# Patient Record
Sex: Male | Born: 1950 | Race: White | Hispanic: No | Smoking: Current every day smoker
Health system: Southern US, Community
[De-identification: ages and names within clinical notes are randomized; demographics above are authoritative.]

## PROBLEM LIST (undated history)

## (undated) DIAGNOSIS — M1990 Unspecified osteoarthritis, unspecified site: Secondary | ICD-10-CM

## (undated) DIAGNOSIS — I10 Essential (primary) hypertension: Secondary | ICD-10-CM

## (undated) DIAGNOSIS — K219 Gastro-esophageal reflux disease without esophagitis: Secondary | ICD-10-CM

## (undated) DIAGNOSIS — Z720 Tobacco use: Secondary | ICD-10-CM

## (undated) DIAGNOSIS — E049 Nontoxic goiter, unspecified: Secondary | ICD-10-CM

## (undated) DIAGNOSIS — E785 Hyperlipidemia, unspecified: Secondary | ICD-10-CM

## (undated) DIAGNOSIS — I2510 Atherosclerotic heart disease of native coronary artery without angina pectoris: Secondary | ICD-10-CM

## (undated) DIAGNOSIS — I350 Nonrheumatic aortic (valve) stenosis: Secondary | ICD-10-CM

## (undated) HISTORY — DX: Tobacco use: Z72.0

## (undated) HISTORY — DX: Hyperlipidemia, unspecified: E78.5

## (undated) HISTORY — DX: Unspecified osteoarthritis, unspecified site: M19.90

## (undated) HISTORY — PX: BACK SURGERY: SHX140

## (undated) HISTORY — DX: Gastro-esophageal reflux disease without esophagitis: K21.9

## (undated) HISTORY — DX: Atherosclerotic heart disease of native coronary artery without angina pectoris: I25.10

## (undated) HISTORY — DX: Essential (primary) hypertension: I10

---

## 2006-09-04 HISTORY — PX: CORONARY ARTERY BYPASS GRAFT: SHX141

## 2010-08-13 ENCOUNTER — Emergency Department (HOSPITAL_COMMUNITY)
Admission: EM | Admit: 2010-08-13 | Discharge: 2010-08-13 | Payer: Self-pay | Source: Home / Self Care | Admitting: Emergency Medicine

## 2011-01-11 ENCOUNTER — Emergency Department (HOSPITAL_COMMUNITY)
Admission: EM | Admit: 2011-01-11 | Discharge: 2011-01-11 | Disposition: A | Discharge status: Home / Self Care | Payer: Medicare Other | Attending: Emergency Medicine | Admitting: Emergency Medicine

## 2011-01-11 DIAGNOSIS — M25539 Pain in unspecified wrist: Secondary | ICD-10-CM | POA: Insufficient documentation

## 2011-01-11 DIAGNOSIS — I2510 Atherosclerotic heart disease of native coronary artery without angina pectoris: Secondary | ICD-10-CM | POA: Insufficient documentation

## 2011-01-11 DIAGNOSIS — E785 Hyperlipidemia, unspecified: Secondary | ICD-10-CM | POA: Insufficient documentation

## 2011-01-11 DIAGNOSIS — M25519 Pain in unspecified shoulder: Secondary | ICD-10-CM | POA: Insufficient documentation

## 2011-01-11 DIAGNOSIS — I10 Essential (primary) hypertension: Secondary | ICD-10-CM | POA: Insufficient documentation

## 2012-07-11 DIAGNOSIS — R0602 Shortness of breath: Secondary | ICD-10-CM

## 2012-07-11 DIAGNOSIS — R079 Chest pain, unspecified: Secondary | ICD-10-CM

## 2013-06-24 ENCOUNTER — Encounter: Payer: Self-pay | Admitting: Family Medicine

## 2013-06-24 ENCOUNTER — Ambulatory Visit (INDEPENDENT_AMBULATORY_CARE_PROVIDER_SITE_OTHER): Payer: Medicare Other | Admitting: Family Medicine

## 2013-06-24 VITALS — BP 134/90 | Temp 98.1°F | Wt 220.2 lb

## 2013-06-24 DIAGNOSIS — E785 Hyperlipidemia, unspecified: Secondary | ICD-10-CM

## 2013-06-24 DIAGNOSIS — J449 Chronic obstructive pulmonary disease, unspecified: Secondary | ICD-10-CM

## 2013-06-24 DIAGNOSIS — I25812 Atherosclerosis of bypass graft of coronary artery of transplanted heart without angina pectoris: Secondary | ICD-10-CM

## 2013-06-24 DIAGNOSIS — J4489 Other specified chronic obstructive pulmonary disease: Secondary | ICD-10-CM

## 2013-06-24 DIAGNOSIS — M19019 Primary osteoarthritis, unspecified shoulder: Secondary | ICD-10-CM

## 2013-06-24 DIAGNOSIS — F17200 Nicotine dependence, unspecified, uncomplicated: Secondary | ICD-10-CM

## 2013-06-24 DIAGNOSIS — I10 Essential (primary) hypertension: Secondary | ICD-10-CM

## 2013-06-24 LAB — CBC WITH DIFFERENTIAL/PLATELET
Eosinophils Absolute: 0.1 10*3/uL (ref 0.0–0.7)
Lymphocytes Relative: 30 % (ref 12–46)
MCH: 32.5 pg (ref 26.0–34.0)
Monocytes Absolute: 0.6 10*3/uL (ref 0.1–1.0)
Monocytes Relative: 10 % (ref 3–12)
Neutro Abs: 3.7 10*3/uL (ref 1.7–7.7)
Neutrophils Relative %: 59 % (ref 43–77)
RBC: 5.2 MIL/uL (ref 4.22–5.81)
RDW: 14.3 % (ref 11.5–15.5)

## 2013-06-24 NOTE — Patient Instructions (Signed)
Hypertension As your heart beats, it forces blood through your arteries. This force is your blood pressure. If the pressure is too high, it is called hypertension (HTN) or high blood pressure. HTN is dangerous because you may have it and not know it. High blood pressure may mean that your heart has to work harder to pump blood. Your arteries may be narrow or stiff. The extra work puts you at risk for heart disease, stroke, and other problems.  Blood pressure consists of two numbers, a higher number over a lower, 110/72, for example. It is stated as "110 over 72." The ideal is below 120 for the top number (systolic) and under 80 for the bottom (diastolic). Write down your blood pressure today. You should pay close attention to your blood pressure if you have certain conditions such as:  Heart failure.  Prior heart attack.  Diabetes  Chronic kidney disease.  Prior stroke.  Multiple risk factors for heart disease. To see if you have HTN, your blood pressure should be measured while you are seated with your arm held at the level of the heart. It should be measured at least twice. A one-time elevated blood pressure reading (especially in the Emergency Department) does not mean that you need treatment. There may be conditions in which the blood pressure is different between your right and left arms. It is important to see your caregiver soon for a recheck. Most people have essential hypertension which means that there is not a specific cause. This type of high blood pressure may be lowered by changing lifestyle factors such as:  Stress.  Smoking.  Lack of exercise.  Excessive weight.  Drug/tobacco/alcohol use.  Eating less salt. Most people do not have symptoms from high blood pressure until it has caused damage to the body. Effective treatment can often prevent, delay or reduce that damage. TREATMENT  When a cause has been identified, treatment for high blood pressure is directed at the  cause. There are a large number of medications to treat HTN. These fall into several categories, and your caregiver will help you select the medicines that are best for you. Medications may have side effects. You should review side effects with your caregiver. If your blood pressure stays high after you have made lifestyle changes or started on medicines,   Your medication(s) may need to be changed.  Other problems may need to be addressed.  Be certain you understand your prescriptions, and know how and when to take your medicine.  Be sure to follow up with your caregiver within the time frame advised (usually within two weeks) to have your blood pressure rechecked and to review your medications.  If you are taking more than one medicine to lower your blood pressure, make sure you know how and at what times they should be taken. Taking two medicines at the same time can result in blood pressure that is too low. SEEK IMMEDIATE MEDICAL CARE IF:  You develop a severe headache, blurred or changing vision, or confusion.  You have unusual weakness or numbness, or a faint feeling.  You have severe chest or abdominal pain, vomiting, or breathing problems. MAKE SURE YOU:   Understand these instructions.  Will watch your condition.  Will get help right away if you are not doing well or get worse. Document Released: 08/21/2005 Document Revised: 11/13/2011 Document Reviewed: 04/10/2008 Exit[employer] Patient Information 2014 [employer]. Chronic Obstructive Pulmonary Disease Chronic obstructive pulmonary disease (COPD) is a condition in which airflow from   the lungs is restricted. The lungs can never return to normal, but there are measures you can take which will improve them and make you feel better. CAUSES   Smoking.  Exposure to secondhand smoke.  Breathing in irritants such as air pollution, dust, cigarette smoke, strong odors, aerosol sprays, or paint fumes.  History of lung  infections. SYMPTOMS   Deep, persistent (chronic) cough with a large amount of thick mucus.  Wheezing.  Shortness of breath, especially with physical activity.  Feeling like you cannot get enough air.  Difficulty breathing.  Rapid breaths (tachypnea).  Gray or bluish discoloration (cyanosis) of the skin, especially in fingers, toes, or lips.  Fatigue.  Weight loss.  Swelling in legs, ankles, or feet.  Fast heartbeat (tachycardia).  Frequent lung infections.   Chest tightness. DIAGNOSIS  Initial diagnosis may be based on your history, symptoms, and physical examination. Additional tests for COPD may include:  Chest X-ray.  Computed tomography (CT) scan.  Lung (pulmonary) function tests.  Blood tests. TREATMENT  Treatment focuses on making you comfortable (supportive care). Your caregiver may prescribe medicines (inhaled or pills) to help improve your breathing. Additional treatment options may include oxygen therapy and pulmonary rehabilitation. Treatment should also include reducing your exposure to known irritants and following a plan to stop smoking. HOME CARE INSTRUCTIONS   Take all medicines, including antibiotic medicines, as directed by your caregiver.  Use inhaled medicines as directed by your caregiver.  Avoid medicines or cough syrups that dry up your airway (antihistamines) and slow down the elimination of secretions. This decreases respiratory capacity and may lead to infections.  If you smoke, stop smoking.  Avoid exposure to smoke, chemicals, and fumes that aggravate your breathing.  Avoid contact with individuals that have a contagious illness.  Avoid extreme temperature and humidity changes.  Use humidifiers at home and at your bedside if they do not make breathing difficult.  Drink enough water and fluids to keep your urine clear or pale yellow. This loosens secretions.  Eat healthy foods. Eating smaller, more frequent meals and resting  before meals may help you maintain your strength.  Ask your caregiver about the use of vitamins and mineral supplements.  Stay active. Exercise and physical activity will help maintain your ability to do things you want to do.  Balance activity with periods of rest.  Assume a position of comfort if you become short of breath.  Learn and use relaxation techniques.  Learn and use controlled breathing techniques as directed by your caregiver. Controlled breathing techniques include:  Pursed lip breathing. This breathing technique starts with breathing in (inhaling) through your nose for 1 second. Next, purse your lips as if you were going to whistle. Then breathe out (exhale) through the pursed lips for 2 seconds.  Diaphragmatic breathing. Start by putting one hand on your abdomen just above your waist. Inhale slowly through your nose. The hand on your abdomen should move out. Then exhale slowly through pursed lips. You should be able to feel the hand on your abdomen moving in as you exhale.  Learn and use controlled coughing to clear mucus from your lungs. Controlled coughing is a series of short, progressive coughs. The steps of controlled coughing are: 1. Lean your head slightly forward. 2. Breathe in deeply using diaphragmatic breathing. 3. Try to hold your breath for 3 seconds. 4. Keep your mouth slightly open while coughing twice. 5. Spit any mucus out into a tissue. 6. Rest and repeat the steps once   or twice as needed.  Receive all protective vaccines your caregiver suggests, especially pneumococcal and influenza vaccines.  Learn to manage stress.  Schedule and attend all follow-up appointments as directed by your caregiver. It is important to keep all your appointments.  Participate in pulmonary rehabilitation as directed by your caregiver.  Use home oxygen as suggested. SEEK MEDICAL CARE IF:   You are coughing up more mucus than usual.  There is a change in the color or  thickness of the mucus.  Breathing is more labored than usual.  Your breathing is faster than usual.  Your skin color is more cyanotic than usual.  You are running out of the medicine you take for your breathing.  You are anxious, apprehensive, or restless.  You have a fever. SEEK IMMEDIATE MEDICAL CARE IF:   You have a rapid heart rate.  You have shortness of breath while you are resting.  You have shortness of breath that prevents you from being able to talk.  You have shortness of breath that prevents you from performing your usual physical activities.  You have chest pain lasting longer than 5 minutes.  You have a seizure.  Your family or friends notice that you are agitated or confused. MAKE SURE YOU:   Understand these instructions.  Will watch your condition.  Will get help right away if you are not doing well or get worse. Document Released: 05/31/2005 Document Revised: 05/15/2012 Document Reviewed: 10/21/2010 Exit[employer] Patient Information 2014 [employer].  

## 2013-06-24 NOTE — Progress Notes (Signed)
Subjective:    Patient ID: Craig Harmon, male    DOB: [birth year], 62 y.o.   MRN: [id number]  HPI Comments: Craig Harmon is a 62 y.o WM here for establishment of care.  Craig Harmon begins  By saying that he use to see Dr. Rolly Pancake in [facility] but doesn't have a truck now.  He is currently in a court battle because he traded his truck for his car but he didn't have the car title and he is trying to get his truck now and is unable to call him.  He has PMH of CAD, MI with 3v CABG, HLD, HTN, tobacco, GERD, OA of right shoulder and tobacco use with COPD. He says he has occasional midsternal chest pain with exertion. He also has pain when lying in bed and changing positions. He says there are needles and pin sensations in his chest. He says lying flat on his back. He is taking Neurontin at nighttime for this pain and he says it helped. He was given this by Dr. Gerlene Burdock cardiologist in [place].   He says he needs refills on his medications.   He denies any complaints today and says he has been out of his medications for about a month. He does have HTN  But blood pressure is good today. It is also noted that the last date on his medication bottles are June and he says he extends his medicines out due to cost.  PMH: HTN, CAD, HLD, Depression, GERD, OA of right shoulder and he gets injections there for lubrication, COPD  Medications: as listed Allergies: NKDA  Social: lives in [place], [place] with younger sister. Great nephew also lives there. Smokes 1 pp day and a half. He doesn't use alcohol or drug use. Has 3 grown children in [place] and 2 in [place], [place]. Surgeries: CABG 3V 2008 [facility] [place] last stress test in [place] and he says it came back good.   Family: mom died 25s from MI, father died 14, he also says his oldest brother and younger sister both had Cystic Fibrosis  Last colonoscopy was 9 months ago and had polyps removed. Unsure what it showed and when he is to come back.      Review of  Systems  Constitutional: Negative for chills, appetite change, fatigue and unexpected weight change.  HENT: Negative for rhinorrhea, sore throat and trouble swallowing.   Eyes: Negative for pain and visual disturbance.  Respiratory: Negative for chest tightness, shortness of breath and wheezing.   Cardiovascular: Positive for chest pain. Negative for palpitations.       Since Cabg but neurontin helps   Gastrointestinal: Negative for nausea, vomiting, diarrhea and constipation.  Endocrine: Negative for cold intolerance, heat intolerance, polydipsia and polyuria.  Genitourinary: Negative for dysuria, frequency, flank pain and difficulty urinating.  Musculoskeletal: Negative for gait problem and myalgias.  Skin: Negative for color change and rash.  Neurological: Negative for dizziness, syncope, weakness, numbness and headaches.  Hematological: Negative for adenopathy. Does not bruise/bleed easily.  Psychiatric/Behavioral: Negative for behavioral problems and confusion.       Objective:   Physical Exam  Nursing note and vitals reviewed. Constitutional: He is oriented to person, place, and time.  WM in NAD  HENT:  Head: Normocephalic and atraumatic.  Right Ear: External ear normal.  Left Ear: External ear normal.  Nose: Nose normal.  Mouth/Throat: Oropharynx is clear and moist.  Eyes: Conjunctivae are normal. Pupils are equal, round, and reactive to light.  Neck: Normal range of motion. Neck supple.  Cardiovascular: Normal rate and normal heart sounds.   Pulmonary/Chest: Effort normal. No respiratory distress. He has no wheezes.  Abdominal: Soft. Bowel sounds are normal. He exhibits no distension. There is no tenderness.  Musculoskeletal: Normal range of motion.  Decreased ROM of right shoulder with abduction to 45 degrees secondary to stiffness and pain  Neurological: He is alert and oriented to person, place, and time.  Skin: Skin is warm and dry.  Psychiatric: He has a normal  mood and affect. His behavior is normal. Thought content normal.      Assessment & Plan:  Craig Harmon was seen today for medication management.  Diagnoses and associated orders for this visit:  HTN (hypertension) - CBC with Differential - Comprehensive metabolic panel - Lipid Panel  COPD (chronic obstructive pulmonary disease) - CBC with Differential  CAD (coronary artery disease), bypass graft transplanted heart - CBC with Differential  HLD (hyperlipidemia) - CBC with Differential - Comprehensive metabolic panel - Lipid Panel  Tobacco use disorder  Osteoarthritis of shoulder, right   will get baseline labs and have patient request records. Will verify which COPD inhaler he is taking and also call this in to be refilled. Follow up in 3 months pending results of labs. If any abnormal results, may have patient follow up sooner.

## 2013-06-25 ENCOUNTER — Encounter: Payer: Self-pay | Admitting: Family Medicine

## 2013-06-25 DIAGNOSIS — M19019 Primary osteoarthritis, unspecified shoulder: Secondary | ICD-10-CM | POA: Insufficient documentation

## 2013-06-25 LAB — COMPREHENSIVE METABOLIC PANEL
AST: 18 U/L (ref 0–37)
Alkaline Phosphatase: 90 U/L (ref 39–117)
Calcium: 9.7 mg/dL (ref 8.4–10.5)
Glucose, Bld: 99 mg/dL (ref 70–99)
Potassium: 4 mEq/L (ref 3.5–5.3)
Sodium: 136 mEq/L (ref 135–145)
Total Bilirubin: 0.6 mg/dL (ref 0.3–1.2)
Total Protein: 6.9 g/dL (ref 6.0–8.3)

## 2013-06-25 LAB — LIPID PANEL
Total CHOL/HDL Ratio: 4.2 Ratio
VLDL: 26 mg/dL (ref 0–40)

## 2013-07-07 ENCOUNTER — Telehealth: Payer: Self-pay | Admitting: *Deleted

## 2013-07-07 NOTE — Telephone Encounter (Signed)
Woman called and left VM stating that MD was supposed to refill pt medications and send to [facility] but he has not received any refills. Nurse returned call and spoke with Arline Asp and she stated that MD was supposed to call in refills on all PO medications and Spiriva. Will route to MD.

## 2013-07-09 ENCOUNTER — Telehealth: Payer: Self-pay | Admitting: *Deleted

## 2013-07-09 ENCOUNTER — Other Ambulatory Visit: Payer: Self-pay | Admitting: Family Medicine

## 2013-07-09 DIAGNOSIS — I10 Essential (primary) hypertension: Secondary | ICD-10-CM

## 2013-07-09 DIAGNOSIS — E785 Hyperlipidemia, unspecified: Secondary | ICD-10-CM

## 2013-07-09 DIAGNOSIS — F329 Major depressive disorder, single episode, unspecified: Secondary | ICD-10-CM

## 2013-07-09 DIAGNOSIS — K219 Gastro-esophageal reflux disease without esophagitis: Secondary | ICD-10-CM

## 2013-07-09 DIAGNOSIS — M792 Neuralgia and neuritis, unspecified: Secondary | ICD-10-CM

## 2013-07-09 MED ORDER — ATORVASTATIN CALCIUM 80 MG PO TABS: 80.0000 mg | ORAL_TABLET | Freq: Every day | ORAL | Status: DC

## 2013-07-09 MED ORDER — ESOMEPRAZOLE MAGNESIUM 20 MG PO PACK: 20.0000 mg | PACK | Freq: Every day | ORAL | Status: DC

## 2013-07-09 MED ORDER — TIOTROPIUM BROMIDE MONOHYDRATE 18 MCG IN CAPS: 18.0000 ug | ORAL_CAPSULE | Freq: Every day | RESPIRATORY_TRACT | Status: DC

## 2013-07-09 MED ORDER — GABAPENTIN 300 MG PO CAPS: 300.0000 mg | ORAL_CAPSULE | Freq: Every day | ORAL | Status: DC

## 2013-07-09 MED ORDER — METOPROLOL SUCCINATE ER 50 MG PO TB24: 50.0000 mg | ORAL_TABLET | Freq: Every day | ORAL | Status: DC

## 2013-07-09 MED ORDER — LISINOPRIL-HYDROCHLOROTHIAZIDE 20-25 MG PO TABS: 1.0000 | ORAL_TABLET | Freq: Every day | ORAL | Status: DC

## 2013-07-09 MED ORDER — SERTRALINE HCL 100 MG PO TABS: 100.0000 mg | ORAL_TABLET | Freq: Every day | ORAL | Status: DC

## 2013-07-09 NOTE — Telephone Encounter (Signed)
Woman called and left VM stating that MD was supposed to refill pt medications and send to [facility] but he has not received any refills. Nurse returned call and spoke with Cindy and she stated that MD was supposed to call in refills on all PO medications and Spiriva. Will route to MD.  

## 2013-09-23 ENCOUNTER — Encounter (HOSPITAL_COMMUNITY): Payer: Self-pay | Admitting: Emergency Medicine

## 2013-09-23 ENCOUNTER — Emergency Department (HOSPITAL_COMMUNITY)
Admission: EM | Admit: 2013-09-23 | Discharge: 2013-09-23 | Disposition: A | Discharge status: Home / Self Care | Payer: Medicare Other | Attending: Emergency Medicine | Admitting: Emergency Medicine

## 2013-09-23 ENCOUNTER — Emergency Department (HOSPITAL_COMMUNITY): Payer: Medicare Other

## 2013-09-23 DIAGNOSIS — Z951 Presence of aortocoronary bypass graft: Secondary | ICD-10-CM | POA: Insufficient documentation

## 2013-09-23 DIAGNOSIS — Z79899 Other long term (current) drug therapy: Secondary | ICD-10-CM | POA: Insufficient documentation

## 2013-09-23 DIAGNOSIS — F17200 Nicotine dependence, unspecified, uncomplicated: Secondary | ICD-10-CM | POA: Insufficient documentation

## 2013-09-23 DIAGNOSIS — E785 Hyperlipidemia, unspecified: Secondary | ICD-10-CM | POA: Insufficient documentation

## 2013-09-23 DIAGNOSIS — I10 Essential (primary) hypertension: Secondary | ICD-10-CM | POA: Insufficient documentation

## 2013-09-23 DIAGNOSIS — K1120 Sialoadenitis, unspecified: Secondary | ICD-10-CM | POA: Insufficient documentation

## 2013-09-23 DIAGNOSIS — Z792 Long term (current) use of antibiotics: Secondary | ICD-10-CM | POA: Insufficient documentation

## 2013-09-23 DIAGNOSIS — K219 Gastro-esophageal reflux disease without esophagitis: Secondary | ICD-10-CM | POA: Insufficient documentation

## 2013-09-23 DIAGNOSIS — IMO0002 Reserved for concepts with insufficient information to code with codable children: Secondary | ICD-10-CM | POA: Insufficient documentation

## 2013-09-23 DIAGNOSIS — I2510 Atherosclerotic heart disease of native coronary artery without angina pectoris: Secondary | ICD-10-CM | POA: Insufficient documentation

## 2013-09-23 LAB — CBC WITH DIFFERENTIAL/PLATELET
Basophils Absolute: 0.1 10*3/uL (ref 0.0–0.1)
Basophils Relative: 1 % (ref 0–1)
EOS ABS: 0.1 10*3/uL (ref 0.0–0.7)
Eosinophils Relative: 2 % (ref 0–5)
HEMATOCRIT: 41.7 % (ref 39.0–52.0)
HEMOGLOBIN: 15.1 g/dL (ref 13.0–17.0)
Lymphocytes Relative: 39 % (ref 12–46)
Lymphs Abs: 2.3 10*3/uL (ref 0.7–4.0)
MCH: 32.3 pg (ref 26.0–34.0)
MCHC: 36.2 g/dL — AB (ref 30.0–36.0)
MCV: 89.1 fL (ref 78.0–100.0)
Monocytes Absolute: 0.6 10*3/uL (ref 0.1–1.0)
Monocytes Relative: 11 % (ref 3–12)
NEUTROS ABS: 2.7 10*3/uL (ref 1.7–7.7)
Neutrophils Relative %: 47 % (ref 43–77)
Platelets: 192 10*3/uL (ref 150–400)
RBC: 4.68 MIL/uL (ref 4.22–5.81)
RDW: 13.2 % (ref 11.5–15.5)
WBC: 5.8 10*3/uL (ref 4.0–10.5)

## 2013-09-23 LAB — BASIC METABOLIC PANEL
BUN: 14 mg/dL (ref 6–23)
CO2: 24 meq/L (ref 19–32)
Calcium: 9.9 mg/dL (ref 8.4–10.5)
Chloride: 99 mEq/L (ref 96–112)
Creatinine, Ser: 0.71 mg/dL (ref 0.50–1.35)
GFR calc Af Amer: >90 mL/min (ref >90)
GLUCOSE: 91 mg/dL (ref 70–99)
POTASSIUM: 3.7 meq/L (ref 3.7–5.3)
Sodium: 137 mEq/L (ref 137–147)

## 2013-09-23 MED ORDER — AMOXICILLIN-POT CLAVULANATE 875-125 MG PO TABS: 1.0000 | ORAL_TABLET | Freq: Two times a day (BID) | ORAL | Status: DC

## 2013-09-23 MED ORDER — PREDNISONE 20 MG PO TABS: ORAL_TABLET | ORAL | Status: DC

## 2013-09-23 MED ORDER — OXYCODONE-ACETAMINOPHEN 5-325 MG PO TABS: 1.0000 | ORAL_TABLET | ORAL | Status: DC | PRN

## 2013-09-23 MED ORDER — SODIUM CHLORIDE 0.9 % IV BOLUS (SEPSIS)
1000.0000 mL | Freq: Once | INTRAVENOUS | Status: AC
  Administered 2013-09-23: 1000 mL via INTRAVENOUS

## 2013-09-23 MED ORDER — AMOXICILLIN-POT CLAVULANATE 875-125 MG PO TABS
1.0000 | ORAL_TABLET | Freq: Once | ORAL | Status: AC
  Administered 2013-09-23: 1 via ORAL
  Filled 2013-09-23: qty 1

## 2013-09-23 MED ORDER — OXYCODONE-ACETAMINOPHEN 5-325 MG PO TABS
2.0000 | ORAL_TABLET | Freq: Once | ORAL | Status: AC
  Administered 2013-09-23: 2 via ORAL
  Filled 2013-09-23: qty 2

## 2013-09-23 MED ORDER — PREDNISONE 50 MG PO TABS
60.0000 mg | ORAL_TABLET | Freq: Once | ORAL | Status: AC
  Administered 2013-09-23: 60 mg via ORAL
  Filled 2013-09-23 (×2): qty 1

## 2013-09-23 MED ORDER — IOHEXOL 300 MG/ML  SOLN
75.0000 mL | Freq: Once | INTRAMUSCULAR | Status: AC | PRN
  Administered 2013-09-23: 75 mL via INTRAVENOUS

## 2013-09-23 MED ORDER — MORPHINE SULFATE 4 MG/ML IJ SOLN
4.0000 mg | Freq: Once | INTRAMUSCULAR | Status: AC
  Administered 2013-09-23: 4 mg via INTRAVENOUS
  Filled 2013-09-23: qty 1

## 2013-09-23 MED ORDER — KETOROLAC TROMETHAMINE 30 MG/ML IJ SOLN
30.0000 mg | Freq: Once | INTRAMUSCULAR | Status: AC
  Administered 2013-09-23: 30 mg via INTRAVENOUS
  Filled 2013-09-23: qty 1

## 2013-09-23 MED ORDER — ONDANSETRON HCL 4 MG/2ML IJ SOLN
4.0000 mg | Freq: Once | INTRAMUSCULAR | Status: AC
  Administered 2013-09-23: 4 mg via INTRAVENOUS
  Filled 2013-09-23: qty 2

## 2013-09-23 NOTE — Discharge Instructions (Signed)
You have inflammation in your submandibular gland which makes saliva.    Hot pack to neck.   Can take ibuprofen 4 tablets 2 times a day.   Also prescription for antibiotic, prednisone, pain medicine. Return if worse

## 2013-09-23 NOTE — ED Notes (Signed)
Swelling of right side of neck and tongue onset yesterday

## 2013-09-24 ENCOUNTER — Ambulatory Visit: Payer: Medicare Other | Admitting: Family Medicine

## 2013-09-25 NOTE — ED Provider Notes (Signed)
CSN: [id number]     Arrival date & time 09/23/13  1611 History   First MD Initiated Contact with Patient 09/23/13 1818     Chief Complaint  Patient presents with  . Cyst   (Consider location/radiation/quality/duration/timing/severity/associated sxs/prior Treatment) HPI.... swelling right side of mandible and neck for 24 hours. Severity is moderate. Palpation makes pain worse. No radiation of pain. Able to swallow. No fever, chills, stiff neck. This has never happened before.  Past Medical History  Diagnosis Date  . HTN (hypertension)   . CAD (coronary artery disease)   . HLD (hyperlipidemia)   . Tobacco abuse   . Hx of CABG   . GERD (gastroesophageal reflux disease)    Past Surgical History  Procedure Laterality Date  . Coronary artery bypass graft  2008   No family history on file. History  Substance Use Topics  . Smoking status: Current Every Day Smoker -- 0.50 packs/day for 45 years    Types: Cigarettes  . Smokeless tobacco: Not on file  . Alcohol Use: Not on file    Review of Systems  All other systems reviewed and are negative.    Allergies  Review of patient's allergies indicates no known allergies.  Home Medications   Current Outpatient Rx  Name  Route  Sig  Dispense  Refill  . atorvastatin (LIPITOR) 80 MG tablet   Oral   Take 1 tablet (80 mg total) by mouth daily.   30 tablet   2   . Chlorpheniramine-DM (COUGH & COLD PO)   Oral   Take 10 mLs by mouth every 4 (four) hours as needed (cough and cold).         Marland Kitchen. esomeprazole (NEXIUM) 40 MG capsule   Oral   Take 40 mg by mouth daily before breakfast.         . gabapentin (NEURONTIN) 300 MG capsule   Oral   Take 1 capsule (300 mg total) by mouth at bedtime. 1 capsule at bedtime   30 capsule   2   . lisinopril-hydrochlorothiazide (PRINZIDE,ZESTORETIC) 20-25 MG per tablet   Oral   Take 1 tablet by mouth daily.   30 tablet   2   . metoprolol succinate (TOPROL-XL) 50 MG 24 hr tablet   Oral    Take 1 tablet (50 mg total) by mouth daily.   30 tablet   2   . sertraline (ZOLOFT) 100 MG tablet   Oral   Take 1 tablet (100 mg total) by mouth daily.   30 tablet   2   . tiotropium (SPIRIVA) 18 MCG inhalation capsule   Inhalation   Place 1 capsule (18 mcg total) into inhaler and inhale daily.   30 capsule   12   . amoxicillin-clavulanate (AUGMENTIN) 875-125 MG per tablet   Oral   Take 1 tablet by mouth 2 (two) times daily.   20 tablet   0   . oxyCODONE-acetaminophen (PERCOCET) 5-325 MG per tablet   Oral   Take 1 tablet by mouth every 4 (four) hours as needed.   15 tablet   0   . predniSONE (DELTASONE) 20 MG tablet      3 tabs po day one, then 2 po daily x 4 days   11 tablet   0    BP 139/87  Pulse 84  Temp(Src) 97.6 F (36.4 C) (Oral)  Resp 20  Ht 5\' 8"  (1.727 m)  Wt 216 lb (97.977 kg)  BMI 32.85 kg/m2  SpO2  95% Physical Exam  Nursing note and vitals reviewed. Constitutional: He is oriented to person, place, and time. He appears well-developed and well-nourished.  HENT:  Head: Normocephalic and atraumatic.  Right submandibular gland is swollen approximately 2 cm in diameter and tender to palpation  Eyes: Conjunctivae and EOM are normal. Pupils are equal, round, and reactive to light.  Neck: Normal range of motion. Neck supple.  Cardiovascular: Normal rate, regular rhythm and normal heart sounds.   Pulmonary/Chest: Effort normal and breath sounds normal.  Abdominal: Soft. Bowel sounds are normal.  Musculoskeletal: Normal range of motion.  Neurological: He is alert and oriented to person, place, and time.  Skin: Skin is warm and dry.  Psychiatric: He has a normal mood and affect. His behavior is normal.    ED Course  Procedures (including critical care time) Labs Review Labs Reviewed  CBC WITH DIFFERENTIAL - Abnormal; Notable for the following:    MCHC 36.2 (*)    All other components within normal limits  BASIC METABOLIC PANEL   Imaging  Review Ct Soft Tissue Neck W Contrast  09/23/2013   CLINICAL DATA:  Right submandibular gland tenderness.  EXAM: CT NECK WITH CONTRAST  TECHNIQUE: Multidetector CT imaging of the neck was performed using the standard protocol following the bolus administration of intravenous contrast.  CONTRAST:  75mL OMNIPAQUE IOHEXOL 300 MG/ML  SOLN  COMPARISON:  None.  FINDINGS: Asymmetric enlargement of the right submandibular gland which shows hyper enhancement and surrounding edema in the soft tissues. Findings most consistent with acute infection. No stone or abscess identified. Left submandibular gland is normal. Parotid gland is normal bilaterally. The tongue and tonsils are normal. Epiglottis and larynx are normal. Thyroid is normal. Lung apices are clear. Negative for adenopathy in the neck. No acute bony changes.  Mucosal edema in the paranasal sinuses. Air-fluid levels in the maxillary sinus bilaterally.  IMPRESSION: Right submandibular gland enlargement consistent with sialoadenitis. Negative for abscess or salivary duct stone.  Sinusitis   Electronically Signed   By: Marlan Palau M.D.   On: 09/23/2013 21:20    EKG Interpretation   None       MDM   1. Submandibular sialoadenitis    CT scan reveals right submandibular gland enlargement consistent with sialoadenitis. Patient is nontoxic. Able to swallow. Discharge medications Augmentin 875/125, Percocet, prednisone.    Donnetta Hutching, MD 09/25/13 2053

## 2013-10-07 ENCOUNTER — Ambulatory Visit: Payer: Medicare Other | Admitting: Family Medicine

## 2013-10-09 ENCOUNTER — Ambulatory Visit (INDEPENDENT_AMBULATORY_CARE_PROVIDER_SITE_OTHER): Payer: Medicare Other | Admitting: Family Medicine

## 2013-10-09 ENCOUNTER — Encounter: Payer: Self-pay | Admitting: Family Medicine

## 2013-10-09 VITALS — BP 140/92 | HR 88 | Temp 97.8°F | Resp 24 | Ht 66.5 in | Wt 207.4 lb

## 2013-10-09 DIAGNOSIS — J449 Chronic obstructive pulmonary disease, unspecified: Secondary | ICD-10-CM

## 2013-10-09 DIAGNOSIS — R634 Abnormal weight loss: Secondary | ICD-10-CM | POA: Insufficient documentation

## 2013-10-09 DIAGNOSIS — R1310 Dysphagia, unspecified: Secondary | ICD-10-CM | POA: Insufficient documentation

## 2013-10-09 DIAGNOSIS — F17200 Nicotine dependence, unspecified, uncomplicated: Secondary | ICD-10-CM

## 2013-10-09 DIAGNOSIS — IMO0001 Reserved for inherently not codable concepts without codable children: Secondary | ICD-10-CM | POA: Insufficient documentation

## 2013-10-09 DIAGNOSIS — R221 Localized swelling, mass and lump, neck: Secondary | ICD-10-CM

## 2013-10-09 DIAGNOSIS — K1120 Sialoadenitis, unspecified: Secondary | ICD-10-CM

## 2013-10-09 DIAGNOSIS — R220 Localized swelling, mass and lump, head: Secondary | ICD-10-CM

## 2013-10-09 MED ORDER — CLINDAMYCIN HCL 300 MG PO CAPS: 300.0000 mg | ORAL_CAPSULE | Freq: Three times a day (TID) | ORAL | Status: DC

## 2013-10-09 MED ORDER — ALBUTEROL SULFATE HFA 108 (90 BASE) MCG/ACT IN AERS
2.0000 | INHALATION_SPRAY | Freq: Four times a day (QID) | RESPIRATORY_TRACT | Status: AC | PRN
Start: 2013-10-09 — End: ?

## 2013-10-09 MED ORDER — OXYCODONE-ACETAMINOPHEN 5-325 MG PO TABS: 1.0000 | ORAL_TABLET | Freq: Three times a day (TID) | ORAL | Status: DC | PRN

## 2013-10-09 NOTE — Progress Notes (Signed)
Subjective:     Patient ID: Craig Harmon, male   DOB: August 19, 1951, 63 y.o.   MRN: [id number]  HPI Comments: Craig Harmon is a pleasant 63 y.o WM here for ED follow up.  He was seen in the ED on 09/23/13 for complaints of right sided neck mass with pain. He says one day it started off like a little lump and then he went to bed that night without any problems. He thought it would go down by the morning but he woke up the next day and the mass had enlarged. He also reports some difficulty swallowing and neck soreness so he went to the ED. He says the symptoms have been on going for about a week before being seen in the ED with the difficulty swallowing. He had CT scan done which showed findings most consistent with submandibular sialoadenitis. He was given a rx for prednisone, percocet, and augmentin of which he has completed. He says the swelling went down since completing the medicines but not completely resolved. He says it actually feels like it's getting bigger again. He rates the pain 7/10. He says the Percocet helped with the pain and he's out currently. He denies any trouble breathing today.   He is a smoker and continues to smoke about 1.5 packs per day. He says he smokes less when he's sick such as this episode with the neck mass. He does also report some SOB but none out of the ordinary. He continues to take his Spiriva but doesn't have any of his albuterol prn inhaler and he requests this today. He also reports some weight loss that's unintentional but denies any hemoptysis or chest pains. He does have a chronic cough that's not out of the ordinary.   Past Medical History  Diagnosis Date  . HTN (hypertension)   . CAD (coronary artery disease)   . HLD (hyperlipidemia)   . Tobacco abuse   . Hx of CABG   . GERD (gastroesophageal reflux disease)    Current Outpatient Prescriptions on File Prior to Visit  Medication Sig Dispense Refill  . atorvastatin (LIPITOR) 80 MG tablet Take 1 tablet (80 mg  total) by mouth daily.  30 tablet  2  . esomeprazole (NEXIUM) 40 MG capsule Take 40 mg by mouth daily before breakfast.      . gabapentin (NEURONTIN) 300 MG capsule Take 1 capsule (300 mg total) by mouth at bedtime. 1 capsule at bedtime  30 capsule  2  . lisinopril-hydrochlorothiazide (PRINZIDE,ZESTORETIC) 20-25 MG per tablet Take 1 tablet by mouth daily.  30 tablet  2  . metoprolol succinate (TOPROL-XL) 50 MG 24 hr tablet Take 1 tablet (50 mg total) by mouth daily.  30 tablet  2  . predniSONE (DELTASONE) 20 MG tablet 3 tabs po day one, then 2 po daily x 4 days  11 tablet  0  . sertraline (ZOLOFT) 100 MG tablet Take 1 tablet (100 mg total) by mouth daily.  30 tablet  2  . tiotropium (SPIRIVA) 18 MCG inhalation capsule Place 1 capsule (18 mcg total) into inhaler and inhale daily.  30 capsule  12   No current facility-administered medications on file prior to visit.   No Known Allergies  History   Social History  . Marital Status: Legally Separated    Spouse Name: N/A    Number of Children: N/A  . Years of Education: N/A   Occupational History  . Not on file.   Social History Main Topics  .  Smoking status: Current Every Day Smoker -- 1.50 packs/day for 45 years    Types: Cigarettes  . Smokeless tobacco: Not on file  . Alcohol Use: Not on file  . Drug Use: Not on file  . Sexual Activity: Not on file   Other Topics Concern  . Not on file   Social History Narrative  . No narrative on file     Review of Systems  Constitutional: Positive for unexpected weight change. Negative for fever, chills, activity change, appetite change and fatigue.       Unintentional weight loss, weight 220 in October 2014 and now 207 lbs.  HENT: Positive for sore throat and trouble swallowing. Negative for congestion, dental problem, ear pain, nosebleeds, postnasal drip, rhinorrhea, sinus pressure, tinnitus and voice change.   Eyes: Negative for photophobia and visual disturbance.  Respiratory:  Positive for cough. Negative for apnea, chest tightness, shortness of breath, wheezing and stridor.        Chronic cough  Cardiovascular: Negative for chest pain and palpitations.  Gastrointestinal: Negative for nausea, vomiting, abdominal pain, diarrhea and constipation.  Endocrine: Negative for cold intolerance, heat intolerance, polydipsia and polyuria.  Genitourinary: Negative for dysuria and difficulty urinating.  Musculoskeletal: Positive for neck pain. Negative for back pain and joint swelling.       Right sided submandibular region due to mass   Skin: Negative for pallor and wound.  Allergic/Immunologic: Negative for environmental allergies and immunocompromised state.  Neurological: Negative for dizziness, speech difficulty, weakness, numbness and headaches.  Hematological: Positive for adenopathy. Does not bruise/bleed easily.  Psychiatric/Behavioral: Negative for confusion, sleep disturbance and agitation. The patient is not nervous/anxious.        Objective:   Physical Exam  Nursing note and vitals reviewed. Constitutional: He is oriented to person, place, and time.  WM in NAD, long white beard   HENT:  Head: Normocephalic and atraumatic.  Right Ear: External ear normal.  Left Ear: External ear normal.  Nose: Nose normal.  Mouth/Throat: Oropharynx is clear and moist.  Eyes: Conjunctivae and EOM are normal. Pupils are equal, round, and reactive to light.  Neck: Trachea normal and normal range of motion. Muscular tenderness present. No tracheal tenderness present. No edema and no erythema present.    Area to right submandibular region with tenderness to light palpation. Exquisitely tender and unable to get size of area due to pain. Patient jumps off the table when attempting to palpate.  Cardiovascular: Normal rate, regular rhythm and normal heart sounds.   Pulmonary/Chest: Effort normal and breath sounds normal. No stridor. He has no wheezes. He exhibits no tenderness.    Abdominal: Soft. Bowel sounds are normal.  Lymphadenopathy:    He has no cervical adenopathy.  Neurological: He is alert and oriented to person, place, and time.  Skin: Skin is warm and dry.  Psychiatric: He has a normal mood and affect. His behavior is normal. Thought content normal.       Assessment:     Craig Harmon was seen today for follow-up.  Diagnoses and associated orders for this visit:  Submandibular sialoadenitis Comments: right side - Ambulatory referral to ENT - oxyCODONE-acetaminophen (PERCOCET) 5-325 MG per tablet; Take 1 tablet by mouth every 8 (eight) hours as needed for severe pain. - clindamycin (CLEOCIN) 300 MG capsule; Take 1 capsule (300 mg total) by mouth 3 (three) times daily.  Mass of right submandibular region  Unintentional weight loss of more than 10 pounds in 90 days Comments: weighed 220 in  October 2014 and now 207 lbs.  Tobacco use disorder - albuterol (PROVENTIL HFA;VENTOLIN HFA) 108 (90 BASE) MCG/ACT inhaler; Inhale 2 puffs into the lungs every 6 (six) hours as needed for wheezing or shortness of breath.  Difficulty swallowing  COPD bronchitis - albuterol (PROVENTIL HFA;VENTOLIN HFA) 108 (90 BASE) MCG/ACT inhaler; Inhale 2 puffs into the lungs every 6 (six) hours as needed for wheezing or shortness of breath.       Plan:     Will do course of Clindamycin for 14 days and refilled pain medicines for patient.  Given him refill on his albuterol inhaler for his COPD as requested. Will try to get into ENT ASAP for evaluation. With his tobacco hx, weight loss of more than 10 pounds in 90 days, needs evaluation and possible biopsy/scope to evaluate the mass.    As always, advised on smoking cessation.

## 2013-10-09 NOTE — Patient Instructions (Signed)
Clindamycin capsules °What is this medicine? °CLINDAMYCIN (KLIN da MYE sin) is a lincosamide antibiotic. It is used to treat certain kinds of bacterial infections. It will not work for colds, flu, or other viral infections. °This medicine may be used for other purposes; ask your health care provider or pharmacist if you have questions. °COMMON BRAND NAME(S): Cleocin °What should I tell my health care provider before I take this medicine? °They need to know if you have any of these conditions: °-kidney disease °-liver disease °-stomach problems like colitis °-an unusual or allergic reaction to clindamycin, lincomycin, or other medicines, foods, dyes like tartrazine or preservatives °-pregnant or trying to get pregnant °-breast-feeding °How should I use this medicine? °Take this medicine by mouth with a full glass of water. Follow the directions on the prescription label. You can take this medicine with food or on an empty stomach. If the medicine upsets your stomach, take it with food. Take your medicine at regular intervals. Do not take your medicine more often than directed. Take all of your medicine as directed even if you think your are better. Do not skip doses or stop your medicine early. °Talk to your pediatrician regarding the use of this medicine in children. Special care may be needed. °Overdosage: If you think you have taken too much of this medicine contact a poison control center or emergency room at once. °NOTE: This medicine is only for you. Do not share this medicine with others. °What if I miss a dose? °If you miss a dose, take it as soon as you can. If it is almost time for your next dose, take only that dose. Do not take double or extra doses. °What may interact with this medicine? °-chloramphenicol °-erythromycin °-kaolin products °This list may not describe all possible interactions. Give your health care provider a list of all the medicines, herbs, non-prescription drugs, or dietary supplements  you use. Also tell them if you smoke, drink alcohol, or use illegal drugs. Some items may interact with your medicine. °What should I watch for while using this medicine? °Tell your doctor or healthcare professional if your symptoms do not start to get better or if they get worse. °Do not treat diarrhea with over the counter products. Contact your doctor if you have diarrhea that lasts more than 2 days or if it is severe and watery. °What side effects may I notice from receiving this medicine? °Side effects that you should report to your doctor or health care professional as soon as possible: °-allergic reactions like skin rash, itching or hives, swelling of the face, lips, or tongue °-dark urine °-pain on swallowing °-redness, blistering, peeling or loosening of the skin, including inside the mouth °-unusual bleeding or bruising °-unusually weak or tired °-yellowing of eyes or skin °Side effects that usually do not require medical attention (report to your doctor or health care professional if they continue or are bothersome): °-diarrhea °-itching in the rectal or genital area °-joint pain °-nausea, vomiting °-stomach pain °This list may not describe all possible side effects. Call your doctor for medical advice about side effects. You may report side effects to FDA at 1-800-FDA-1088. °Where should I keep my medicine? °Keep out of the reach of children. °Store at room temperature between 20 and 25 degrees C (68 and 77 degrees F). Throw away any unused medicine after the expiration date. °NOTE: This sheet is a summary. It may not cover all possible information. If you have questions about this medicine, talk to   your doctor, pharmacist, or health care provider. °© 2014, [employer]. (2010-01-12 10:12:31) ° °

## 2013-11-23 ENCOUNTER — Emergency Department (HOSPITAL_COMMUNITY)
Admission: EM | Admit: 2013-11-23 | Discharge: 2013-11-23 | Disposition: A | Discharge status: Home / Self Care | Payer: Medicare Other | Attending: Emergency Medicine | Admitting: Emergency Medicine

## 2013-11-23 ENCOUNTER — Encounter (HOSPITAL_COMMUNITY): Payer: Self-pay | Admitting: Emergency Medicine

## 2013-11-23 DIAGNOSIS — Z862 Personal history of diseases of the blood and blood-forming organs and certain disorders involving the immune mechanism: Secondary | ICD-10-CM | POA: Insufficient documentation

## 2013-11-23 DIAGNOSIS — H6590 Unspecified nonsuppurative otitis media, unspecified ear: Secondary | ICD-10-CM | POA: Insufficient documentation

## 2013-11-23 DIAGNOSIS — R05 Cough: Secondary | ICD-10-CM | POA: Insufficient documentation

## 2013-11-23 DIAGNOSIS — F17200 Nicotine dependence, unspecified, uncomplicated: Secondary | ICD-10-CM | POA: Insufficient documentation

## 2013-11-23 DIAGNOSIS — K219 Gastro-esophageal reflux disease without esophagitis: Secondary | ICD-10-CM | POA: Insufficient documentation

## 2013-11-23 DIAGNOSIS — Z79899 Other long term (current) drug therapy: Secondary | ICD-10-CM | POA: Insufficient documentation

## 2013-11-23 DIAGNOSIS — R059 Cough, unspecified: Secondary | ICD-10-CM | POA: Insufficient documentation

## 2013-11-23 DIAGNOSIS — IMO0002 Reserved for concepts with insufficient information to code with codable children: Secondary | ICD-10-CM | POA: Insufficient documentation

## 2013-11-23 DIAGNOSIS — H6591 Unspecified nonsuppurative otitis media, right ear: Secondary | ICD-10-CM

## 2013-11-23 DIAGNOSIS — I10 Essential (primary) hypertension: Secondary | ICD-10-CM | POA: Insufficient documentation

## 2013-11-23 DIAGNOSIS — Z792 Long term (current) use of antibiotics: Secondary | ICD-10-CM | POA: Insufficient documentation

## 2013-11-23 DIAGNOSIS — I2510 Atherosclerotic heart disease of native coronary artery without angina pectoris: Secondary | ICD-10-CM | POA: Insufficient documentation

## 2013-11-23 DIAGNOSIS — H9191 Unspecified hearing loss, right ear: Secondary | ICD-10-CM

## 2013-11-23 DIAGNOSIS — Z8639 Personal history of other endocrine, nutritional and metabolic disease: Secondary | ICD-10-CM | POA: Insufficient documentation

## 2013-11-23 DIAGNOSIS — Z951 Presence of aortocoronary bypass graft: Secondary | ICD-10-CM | POA: Insufficient documentation

## 2013-11-23 MED ORDER — SALINE SPRAY 0.65 % NA SOLN: 1.0000 | NASAL | Status: DC | PRN

## 2013-11-23 MED ORDER — AMOXICILLIN-POT CLAVULANATE 875-125 MG PO TABS: 1.0000 | ORAL_TABLET | Freq: Two times a day (BID) | ORAL | Status: DC

## 2013-11-23 MED ORDER — TRAMADOL HCL 50 MG PO TABS: 50.0000 mg | ORAL_TABLET | Freq: Four times a day (QID) | ORAL | Status: DC | PRN

## 2013-11-23 MED ORDER — ANTIPYRINE-BENZOCAINE 5.4-1.4 % OT SOLN: 3.0000 [drp] | OTIC | Status: DC | PRN

## 2013-11-23 NOTE — ED Notes (Signed)
Pt reports r earache and blood drainage for past 4 days.  Reports can't hear well out of it.

## 2013-11-23 NOTE — ED Provider Notes (Signed)
CSN: [id number]     Arrival date & time 11/23/13  1215 History   First MD Initiated Contact with Patient 11/23/13 1230     Chief Complaint  Patient presents with  . Otalgia     (Consider location/radiation/quality/duration/timing/severity/associated sxs/prior Treatment) HPI Pt is a 63yo male c/o right ear pain associated with blood drainage for past 4 days and decreased hearing. Ear pain is constant, throbbing, and burning in nature, 7/10. Pt denies trauma to ear. Denies fever, congestion, n/v/d. Denies sore throat but has had a cough. Pt has not tried anything for pain.  Past Medical History  Diagnosis Date  . HTN (hypertension)   . CAD (coronary artery disease)   . HLD (hyperlipidemia)   . Tobacco abuse   . Hx of CABG   . GERD (gastroesophageal reflux disease)    Past Surgical History  Procedure Laterality Date  . Coronary artery bypass graft  2008  . Back surgery     No family history on file. History  Substance Use Topics  . Smoking status: Current Every Day Smoker -- 0.50 packs/day for 45 years    Types: Cigarettes  . Smokeless tobacco: Not on file  . Alcohol Use: Not on file    Review of Systems  Constitutional: Negative for fever and chills.  HENT: Positive for ear discharge and ear pain. Negative for congestion and sore throat.        Right ear  Respiratory: Positive for cough. Negative for shortness of breath.   Cardiovascular: Negative for chest pain.  Gastrointestinal: Negative for nausea, vomiting and diarrhea.  Neurological: Negative for headaches.  All other systems reviewed and are negative.      Allergies  Review of patient's allergies indicates no known allergies.  Home Medications   Current Outpatient Rx  Name  Route  Sig  Dispense  Refill  . albuterol (PROVENTIL HFA;VENTOLIN HFA) 108 (90 BASE) MCG/ACT inhaler   Inhalation   Inhale 2 puffs into the lungs every 6 (six) hours as needed for wheezing or shortness of breath.   1 Inhaler   5    . amoxicillin-clavulanate (AUGMENTIN) 875-125 MG per tablet   Oral   Take 1 tablet by mouth every 12 (twelve) hours.   14 tablet   0   . antipyrine-benzocaine (AURALGAN) otic solution   Right Ear   Place 3-4 drops into the right ear every 2 (two) hours as needed for ear pain.   10 mL   0   . atorvastatin (LIPITOR) 80 MG tablet   Oral   Take 1 tablet (80 mg total) by mouth daily.   30 tablet   2   . clindamycin (CLEOCIN) 300 MG capsule   Oral   Take 1 capsule (300 mg total) by mouth 3 (three) times daily.   42 capsule   0   . esomeprazole (NEXIUM) 40 MG capsule   Oral   Take 40 mg by mouth daily before breakfast.         . gabapentin (NEURONTIN) 300 MG capsule   Oral   Take 1 capsule (300 mg total) by mouth at bedtime. 1 capsule at bedtime   30 capsule   2   . lisinopril-hydrochlorothiazide (PRINZIDE,ZESTORETIC) 20-25 MG per tablet   Oral   Take 1 tablet by mouth daily.   30 tablet   2   . metoprolol succinate (TOPROL-XL) 50 MG 24 hr tablet   Oral   Take 1 tablet (50 mg total) by mouth daily.  30 tablet   2   . oxyCODONE-acetaminophen (PERCOCET) 5-325 MG per tablet   Oral   Take 1 tablet by mouth every 8 (eight) hours as needed for severe pain.   30 tablet   0   . predniSONE (DELTASONE) 20 MG tablet      3 tabs po day one, then 2 po daily x 4 days   11 tablet   0   . sertraline (ZOLOFT) 100 MG tablet   Oral   Take 1 tablet (100 mg total) by mouth daily.   30 tablet   2   . sodium chloride (OCEAN) 0.65 % SOLN nasal spray   Each Nare   Place 1 spray into both nostrils as needed for congestion.   1 Bottle   0   . tiotropium (SPIRIVA) 18 MCG inhalation capsule   Inhalation   Place 1 capsule (18 mcg total) into inhaler and inhale daily.   30 capsule   12   . traMADol (ULTRAM) 50 MG tablet   Oral   Take 1 tablet (50 mg total) by mouth every 6 (six) hours as needed.   15 tablet   0    BP 139/86  Pulse 76  Temp(Src) 97.5 F (36.4 C)  (Oral)  Resp 20  Ht 5\' 8"  (1.727 m)  Wt 234 lb (106.142 kg)  BMI 35.59 kg/m2  SpO2 98% Physical Exam  Nursing note and vitals reviewed. Constitutional: He appears well-developed and well-nourished.  HENT:  Head: Normocephalic and atraumatic.  Right Ear: There is tenderness. No mastoid tenderness. Tympanic membrane is injected and erythematous. Tympanic membrane is not scarred, not perforated, not retracted and not bulging. Tympanic membrane mobility is normal. A middle ear effusion is present. Decreased hearing is noted.  Left Ear: Hearing, tympanic membrane, external ear and ear canal normal.  Nose: Nose normal.  Mouth/Throat: Uvula is midline, oropharynx is clear and moist and mucous membranes are normal.  Eyes: Conjunctivae are normal. No scleral icterus.  Neck: Normal range of motion. Neck supple.  Cardiovascular: Normal rate, regular rhythm and normal heart sounds.   Pulmonary/Chest: Effort normal and breath sounds normal. No respiratory distress. He has no wheezes. He has no rales. He exhibits no tenderness.  Abdominal: Soft. Bowel sounds are normal. He exhibits no distension and no mass. There is no tenderness. There is no rebound and no guarding.  Musculoskeletal: Normal range of motion.  Neurological: He is alert.  Skin: Skin is warm and dry.    ED Course  Procedures (including critical care time) Labs Review Labs Reviewed - No data to display Imaging Review No results found.   EKG Interpretation None      MDM   Final diagnoses:  Right otitis media with effusion  Decreased hearing of right ear    pt presenting to ED c/o right ear pain. On exam TM in tact, erythematous with middle ear effusion and decreased hearing. No cerumen impaction or blood in canal.  Will dx home with Rx: augmentin, auralgan, tramadol, and ocean nasal spray. Advised to f/u with PCP later this week if symptoms not improving. Return precautions provided. Pt verbalized understanding and  agreement with tx plan.     Junius Finnerrin O'Malley, PA-C 11/23/13 1650

## 2013-11-26 NOTE — ED Provider Notes (Signed)
Medical screening examination/treatment/procedure(s) were performed by non-physician practitioner and as supervising physician I was immediately available for consultation/collaboration.   EKG Interpretation None       Liisa Picone, MD 11/26/13 1226 

## 2013-12-10 ENCOUNTER — Ambulatory Visit (INDEPENDENT_AMBULATORY_CARE_PROVIDER_SITE_OTHER): Payer: Medicare Other | Admitting: Family Medicine

## 2013-12-10 ENCOUNTER — Encounter: Payer: Self-pay | Admitting: Family Medicine

## 2013-12-10 VITALS — BP 128/88 | HR 84 | Temp 98.4°F | Resp 20 | Ht 68.0 in | Wt 205.0 lb

## 2013-12-10 DIAGNOSIS — M25552 Pain in left hip: Secondary | ICD-10-CM

## 2013-12-10 DIAGNOSIS — H9211 Otorrhea, right ear: Secondary | ICD-10-CM

## 2013-12-10 DIAGNOSIS — H6091 Unspecified otitis externa, right ear: Secondary | ICD-10-CM | POA: Insufficient documentation

## 2013-12-10 DIAGNOSIS — M25559 Pain in unspecified hip: Secondary | ICD-10-CM

## 2013-12-10 DIAGNOSIS — H9190 Unspecified hearing loss, unspecified ear: Secondary | ICD-10-CM

## 2013-12-10 DIAGNOSIS — H60399 Other infective otitis externa, unspecified ear: Secondary | ICD-10-CM

## 2013-12-10 DIAGNOSIS — H9210 Otorrhea, unspecified ear: Secondary | ICD-10-CM

## 2013-12-10 DIAGNOSIS — G8929 Other chronic pain: Secondary | ICD-10-CM

## 2013-12-10 DIAGNOSIS — H9191 Unspecified hearing loss, right ear: Secondary | ICD-10-CM | POA: Insufficient documentation

## 2013-12-10 MED ORDER — OFLOXACIN 0.3 % OT SOLN: 10.0000 [drp] | Freq: Every day | OTIC | Status: AC

## 2013-12-10 NOTE — Progress Notes (Signed)
Subjective:     Patient ID: Craig Harmon, male   DOB: 20-Feb-1951, 63 y.o.   MRN: [id number]  Otalgia  There is pain in the right ear. This is a recurrent problem. The current episode started 1 to 4 weeks ago (went to ED on 11/23/13 due to bloody discharge and pain in the right ear that started that day). The problem has been waxing and waning. There has been no fever. The pain is at a severity of 6/10. The pain is moderate. Associated symptoms include ear discharge and hearing loss. Pertinent negatives include no coughing, headaches, neck pain, rash, rhinorrhea, sore throat or vomiting. He has tried antibiotics and ear drops (given Augmentin po and Auralgan drops) for the symptoms. The treatment provided no relief.  Hip Pain  The incident occurred more than 1 week ago (chronic left hip pain, has hx of OA). There was no injury mechanism. The pain is present in the left hip. The quality of the pain is described as aching. The pain is at a severity of 6/10. The pain is moderate. The pain has been worsening since onset. Associated symptoms include an inability to bear weight and a loss of motion. Pertinent negatives include no loss of sensation, muscle weakness, numbness or tingling. The symptoms are aggravated by weight bearing and movement. He has tried NSAIDs and acetaminophen for the symptoms. The treatment provided mild relief.   Craig Harmon also has a hx of right submandibular sialoadenitis and was treated with 2 courses of antibiotics. He has seen ENT for this issue as well and they instructed him to continue the Cleocin I had placed him on as the second round. He says this cleared up and got better but now, it seems like the right ear drainage started shortly after that.  Past Medical History  Diagnosis Date  . HTN (hypertension)   . CAD (coronary artery disease)   . HLD (hyperlipidemia)   . Tobacco abuse   . Hx of CABG    Hearing Loss, Right   . GERD (gastroesophageal reflux disease)     Current Outpatient Prescriptions on File Prior to Visit  Medication Sig Dispense Refill  . albuterol (PROVENTIL HFA;VENTOLIN HFA) 108 (90 BASE) MCG/ACT inhaler Inhale 2 puffs into the lungs every 6 (six) hours as needed for wheezing or shortness of breath.  1 Inhaler  5  . antipyrine-benzocaine (AURALGAN) otic solution Place 3-4 drops into the right ear every 2 (two) hours as needed for ear pain.  10 mL  0  . atorvastatin (LIPITOR) 80 MG tablet Take 1 tablet (80 mg total) by mouth daily.  30 tablet  2  . clindamycin (CLEOCIN) 300 MG capsule Take 1 capsule (300 mg total) by mouth 3 (three) times daily.  42 capsule  0  . esomeprazole (NEXIUM) 40 MG capsule Take 40 mg by mouth daily before breakfast.      . gabapentin (NEURONTIN) 300 MG capsule Take 1 capsule (300 mg total) by mouth at bedtime. 1 capsule at bedtime  30 capsule  2  . lisinopril-hydrochlorothiazide (PRINZIDE,ZESTORETIC) 20-25 MG per tablet Take 1 tablet by mouth daily.  30 tablet  2  . metoprolol succinate (TOPROL-XL) 50 MG 24 hr tablet Take 1 tablet (50 mg total) by mouth daily.  30 tablet  2  . oxyCODONE-acetaminophen (PERCOCET) 5-325 MG per tablet Take 1 tablet by mouth every 8 (eight) hours as needed for severe pain.  30 tablet  0  . predniSONE (DELTASONE) 20 MG tablet 3  tabs po day one, then 2 po daily x 4 days  11 tablet  0  . sertraline (ZOLOFT) 100 MG tablet Take 1 tablet (100 mg total) by mouth daily.  30 tablet  2  . sodium chloride (OCEAN) 0.65 % SOLN nasal spray Place 1 spray into both nostrils as needed for congestion.  1 Bottle  0  . tiotropium (SPIRIVA) 18 MCG inhalation capsule Place 1 capsule (18 mcg total) into inhaler and inhale daily.  30 capsule  12  . traMADol (ULTRAM) 50 MG tablet Take 1 tablet (50 mg total) by mouth every 6 (six) hours as needed.  15 tablet  0   No current facility-administered medications on file prior to visit.   No Known Allergies   Review of Systems  HENT: Positive for ear discharge,  ear pain and hearing loss. Negative for rhinorrhea and sore throat.   Respiratory: Negative for cough.   Gastrointestinal: Negative for vomiting.  Musculoskeletal: Positive for arthralgias and gait problem. Negative for neck pain.       Chronic left hip pain   Skin: Negative for rash.  Neurological: Negative for tingling, numbness and headaches.       Objective:   Physical Exam  Nursing note and vitals reviewed. Constitutional: He is oriented to person, place, and time. He appears well-developed and well-nourished.  HENT:  Head: Normocephalic and atraumatic.  Left Ear: External ear normal.  Nose: Nose normal.  Mouth/Throat: Oropharynx is clear and moist.  Right auricle tender to touching, some minimal clear drainage to right ear, TM visualized but dull in appearance  Eyes: Conjunctivae are normal. Pupils are equal, round, and reactive to light.  Neck: Normal range of motion. Neck supple.  Cardiovascular: Normal rate.   Pulmonary/Chest: Effort normal.  Lymphadenopathy:    He has no cervical adenopathy.  Neurological: He is alert and oriented to person, place, and time.  Skin: Skin is warm and dry.  Psychiatric: He has a normal mood and affect. His behavior is normal. Judgment and thought content normal.       Assessment:     Craig Harmon was seen today for otalgia and hip pain.  Diagnoses and associated orders for this visit:  Ear drainage right  Otitis externa of right ear  Hearing loss in right ear - Ambulatory referral to Audiology  Chronic left hip pain  Other Orders - ofloxacin (FLOXIN) 0.3 % otic solution; Place 10 drops into the right ear daily.       Plan:     Hearing loss from right ear is chronic in nature. Sending for audiology hearing screening to assess for need of hearing aids.  He has right auricle pain as well as discharge and suspect this is right Otitis externa. He has completed a course of Augmentin without much relief. He still has the auralgan  drops and to continue this prn and will add this time, Ofloxacin drops to the right ear for 7 days. To follow up in 2 weeks.  Handicap form completed for him due to left hip pain, OA of left hip and walking difficulty.

## 2013-12-10 NOTE — Patient Instructions (Signed)
Otitis Externa Otitis externa is a bacterial or fungal infection of the outer ear canal. This is the area from the eardrum to the outside of the ear. Otitis externa is sometimes called "swimmer's ear." CAUSES  Possible causes of infection include:  Swimming in dirty water.  Moisture remaining in the ear after swimming or bathing.  Mild injury (trauma) to the ear.  Objects stuck in the ear (foreign body).  Cuts or scrapes (abrasions) on the outside of the ear. SYMPTOMS  The first symptom of infection is often itching in the ear canal. Later signs and symptoms may include swelling and redness of the ear canal, ear pain, and yellowish-white fluid (pus) coming from the ear. The ear pain may be worse when pulling on the earlobe. DIAGNOSIS  Your caregiver will perform a physical exam. A sample of fluid may be taken from the ear and examined for bacteria or fungi. TREATMENT  Antibiotic ear drops are often given for 10 to 14 days. Treatment may also include pain medicine or corticosteroids to reduce itching and swelling. PREVENTION   Keep your ear dry. Use the corner of a towel to absorb water out of the ear canal after swimming or bathing.  Avoid scratching or putting objects inside your ear. This can damage the ear canal or remove the protective wax that lines the canal. This makes it easier for bacteria and fungi to grow.  Avoid swimming in lakes, polluted water, or poorly chlorinated pools.  You may use ear drops made of rubbing alcohol and vinegar after swimming. Combine equal parts of white vinegar and alcohol in a bottle. Put 3 or 4 drops into each ear after swimming. HOME CARE INSTRUCTIONS   Apply antibiotic ear drops to the ear canal as prescribed by your caregiver.  Only take over-the-counter or prescription medicines for pain, discomfort, or fever as directed by your caregiver.  If you have diabetes, follow any additional treatment instructions from your caregiver.  Keep all  follow-up appointments as directed by your caregiver. SEEK MEDICAL CARE IF:   You have a fever.  Your ear is still red, swollen, painful, or draining pus after 3 days.  Your redness, swelling, or pain gets worse.  You have a severe headache.  You have redness, swelling, pain, or tenderness in the area behind your ear. MAKE SURE YOU:   Understand these instructions.  Will watch your condition.  Will get help right away if you are not doing well or get worse. Document Released: 08/21/2005 Document Revised: 11/13/2011 Document Reviewed: 09/07/2011 [employer] Patient Information 2014 [employer]. Ofloxacin ear solution What is this medicine? OFLOXACIN (oh FLOKS a sin) is a quinolone antibiotic. It is used to treat bacterial ear infections. This medicine may be used for other purposes; ask your health care provider or pharmacist if you have questions. COMMON BRAND NAME(S): Floxin What should I tell my health care provider before I take this medicine? They need to know if you have any of these conditions: -difficulty hearing -an unusual or allergic reaction to ofloxacin, quinolone antibiotics, other medicines, foods, dyes, or preservatives -pregnant or trying to get pregnant -breast-feeding How should I use this medicine? This medicine is only for use in the ear. Wash your hands with soap and water. Do not insert any object or swab into the ear canal. Gently warm the bottle by holding it in the hand for 1 to 2 minutes. Gently clean any fluid that can be easily removed from the outer ear. Lie down on   your side with the infected ear up. Try not to touch the tip of the dropper to your ear, fingertips, or other surface. Squeeze the bottle gently to put the prescribed number of drops in the ear canal. For ear canal infections, gently pull the outer ear upward and backward to help the drops flow down into the ear canal. For middle ear infections, press the skin-covered cartilage in the front  part of the ear 4 times in a pumping motion to allow the drops to pass through the hole or tube in the eardrum. Keep lying down with the ear up for about 5 minutes to make sure the drops stay in the ear. Repeat the steps for the other ear if both ears are infected. Do not use your medicine more often than directed. Finish the full course of medicine prescribed by your doctor or health care professional even if you think your condition is better. Talk to your pediatrician regarding the use of this medicine in children. While this drug may be prescribed for children as young as 6 months of age and older for selected conditions, precautions do apply. Overdosage: If you think you have taken too much of this medicine contact a poison control center or emergency room at once. NOTE: This medicine is only for you. Do not share this medicine with others. What if I miss a dose? If you miss a dose, use it as soon as you can. If it is almost time for your next dose, use only that dose. Do not use double or extra doses. What may interact with this medicine? Interactions are not expected. Do not use any other ear products without talking to your doctor or health care professional. This list may not describe all possible interactions. Give your health care provider a list of all the medicines, herbs, non-prescription drugs, or dietary supplements you use. Also tell them if you smoke, drink alcohol, or use illegal drugs. Some items may interact with your medicine. What should I watch for while using this medicine? Tell your doctor or health care professional if your ear infection does not get better in a few days. After you finish the full course of treatment, tell your doctor or health care professional if you have two or more episodes of drainage from the ear within 6 months. It is important that you keep the infected ear(s) clean and dry. When bathing, try not to get the infected ear(s) wet. Do not go swimming unless  your doctor or health care professional has told you otherwise. To prevent the spread of infection, do not share ear products, or share towels and washcloths with anyone else. What side effects may I notice from receiving this medicine? Side effects that you should report to your doctor or health care professional as soon as possible: -burning, blistering, itching, and redness -dizziness -rash -worsening ear pain Side effects that usually do not require medical attention (report to your doctor or health care professional if they continue or are bothersome): -abnormal sensation in the ear -bad taste in mouth -unpleasant sensation while putting the drops in the ear This list may not describe all possible side effects. Call your doctor for medical advice about side effects. You may report side effects to FDA at 1-800-FDA-1088. Where should I keep my medicine? Keep out of the reach of children. Store at room temperature between 15 and 25 degrees C (59 and 77 degrees F). Throw away any unused medicine after the expiration date. NOTE: This sheet   is a summary. It may not cover all possible information. If you have questions about this medicine, talk to your doctor, pharmacist, or health care provider.  2014, [employer]. (2008-03-17 16:48:15)  

## 2013-12-24 ENCOUNTER — Encounter: Payer: Self-pay | Admitting: Family Medicine

## 2013-12-24 ENCOUNTER — Telehealth: Payer: Self-pay | Admitting: *Deleted

## 2013-12-24 ENCOUNTER — Ambulatory Visit (INDEPENDENT_AMBULATORY_CARE_PROVIDER_SITE_OTHER): Payer: Medicare Other | Admitting: Family Medicine

## 2013-12-24 ENCOUNTER — Ambulatory Visit (HOSPITAL_COMMUNITY)
Admission: RE | Admit: 2013-12-24 | Discharge: 2013-12-24 | Disposition: A | Discharge status: Home / Self Care | Payer: Medicare Other | Source: Ambulatory Visit | Attending: Family Medicine | Admitting: Family Medicine

## 2013-12-24 VITALS — BP 130/88 | HR 86 | Temp 97.8°F | Resp 18 | Ht 67.5 in | Wt 201.0 lb

## 2013-12-24 DIAGNOSIS — R221 Localized swelling, mass and lump, neck: Secondary | ICD-10-CM

## 2013-12-24 DIAGNOSIS — R634 Abnormal weight loss: Secondary | ICD-10-CM

## 2013-12-24 DIAGNOSIS — W19XXXA Unspecified fall, initial encounter: Secondary | ICD-10-CM | POA: Insufficient documentation

## 2013-12-24 DIAGNOSIS — M25559 Pain in unspecified hip: Secondary | ICD-10-CM

## 2013-12-24 DIAGNOSIS — Z720 Tobacco use: Secondary | ICD-10-CM | POA: Insufficient documentation

## 2013-12-24 DIAGNOSIS — M545 Low back pain, unspecified: Secondary | ICD-10-CM

## 2013-12-24 DIAGNOSIS — M25552 Pain in left hip: Secondary | ICD-10-CM

## 2013-12-24 DIAGNOSIS — F17200 Nicotine dependence, unspecified, uncomplicated: Secondary | ICD-10-CM

## 2013-12-24 DIAGNOSIS — H9191 Unspecified hearing loss, right ear: Secondary | ICD-10-CM

## 2013-12-24 DIAGNOSIS — M533 Sacrococcygeal disorders, not elsewhere classified: Secondary | ICD-10-CM

## 2013-12-24 DIAGNOSIS — H9201 Otalgia, right ear: Secondary | ICD-10-CM | POA: Insufficient documentation

## 2013-12-24 DIAGNOSIS — Y92009 Unspecified place in unspecified non-institutional (private) residence as the place of occurrence of the external cause: Secondary | ICD-10-CM | POA: Insufficient documentation

## 2013-12-24 DIAGNOSIS — H9209 Otalgia, unspecified ear: Secondary | ICD-10-CM

## 2013-12-24 DIAGNOSIS — H9190 Unspecified hearing loss, unspecified ear: Secondary | ICD-10-CM

## 2013-12-24 DIAGNOSIS — H9211 Otorrhea, right ear: Secondary | ICD-10-CM

## 2013-12-24 DIAGNOSIS — R220 Localized swelling, mass and lump, head: Secondary | ICD-10-CM

## 2013-12-24 DIAGNOSIS — H9210 Otorrhea, unspecified ear: Secondary | ICD-10-CM

## 2013-12-24 LAB — CBC WITH DIFFERENTIAL/PLATELET
Basophils Absolute: 0 10*3/uL (ref 0.0–0.1)
Basophils Relative: 0 % (ref 0–1)
EOS ABS: 0.1 10*3/uL (ref 0.0–0.7)
EOS PCT: 2 % (ref 0–5)
HCT: 44.9 % (ref 39.0–52.0)
HEMOGLOBIN: 16.2 g/dL (ref 13.0–17.0)
LYMPHS ABS: 2 10*3/uL (ref 0.7–4.0)
Lymphocytes Relative: 29 % (ref 12–46)
MCH: 32.3 pg (ref 26.0–34.0)
MCHC: 36.1 g/dL — AB (ref 30.0–36.0)
MCV: 89.4 fL (ref 78.0–100.0)
MONO ABS: 0.6 10*3/uL (ref 0.1–1.0)
Monocytes Relative: 9 % (ref 3–12)
NEUTROS PCT: 60 % (ref 43–77)
Neutro Abs: 4.1 10*3/uL (ref 1.7–7.7)
Platelets: 254 10*3/uL (ref 150–400)
RBC: 5.02 MIL/uL (ref 4.22–5.81)
RDW: 14.2 % (ref 11.5–15.5)
WBC: 6.9 10*3/uL (ref 4.0–10.5)

## 2013-12-24 MED ORDER — METAXALONE 800 MG PO TABS: 800.0000 mg | ORAL_TABLET | Freq: Three times a day (TID) | ORAL | Status: DC | PRN

## 2013-12-24 MED ORDER — MELOXICAM 15 MG PO TABS: ORAL_TABLET | ORAL | Status: DC

## 2013-12-24 MED ORDER — CYCLOBENZAPRINE HCL 5 MG PO TABS: 5.0000 mg | ORAL_TABLET | Freq: Three times a day (TID) | ORAL | Status: DC | PRN

## 2013-12-24 NOTE — Progress Notes (Signed)
Subjective:     Patient ID: Craig Harmon, male   DOB: 12-Oct-1950, 63 y.o.   MRN: [id number]  HPI Comments: Craig Harmon is a 63 y.o WM here for follow up of right ear pain and drainage.  He was seen 2 weeks ago for right ear drainage. He was seen in the ED for this problem due to blood discharge from the right ear along with pain. He was given Augmentin and Auralgan drops. He followed up with me and drainage continued to occur despite the Augmentin so I gave him ofloxocin ear drops as well for 7 additional days. He is here for follow up and says he still has some drainage but it isn't as bad as before. He still has some pain and some decrease hearing which the hearing is chronic in nature. He was sent to Audiology for hearing testing and this appt is May 12. He did have a hx of right submandibular mass that was treated as well with 2 courses of antibiotics and he was referred to ENT. A scope was done to access surrounding structures by Dr. Suszanne Connerseoh and everything looked good. The mass in his right submandibular region is improved and smaller but still present. He still has some tenderness to the area with light touch and it is noted to be a lump in the area as well.   He also reports a fall on 12/21/13 while at home. He says he got out of the car and was letting his dog out of the back seat. He opened the door for him and he jumped out. He says he turned around to head inside the house and his left knee buckled and he fell. He says he was going to the ED but didn't have anyone to go with him to drive, in case he needed pain medicine. He says the pain is in his lower back where he previous surgery was and his left hip. He says it's different from the previous pain he's been having with his left hip. It's twice as bad and that pain was tolerable, this pain is worse with standing, walking, or any movement. Nothing seems to make it better. He has tried heating towels to place on his back and this has eased the pain some  but not resolved the pain.  Past Medical History  Diagnosis Date  . HTN (hypertension)   . CAD (coronary artery disease)   . HLD (hyperlipidemia)   . Tobacco abuse   . Hx of CABG   . GERD (gastroesophageal reflux disease)    Current Outpatient Prescriptions on File Prior to Visit  Medication Sig Dispense Refill  . albuterol (PROVENTIL HFA;VENTOLIN HFA) 108 (90 BASE) MCG/ACT inhaler Inhale 2 puffs into the lungs every 6 (six) hours as needed for wheezing or shortness of breath.  1 Inhaler  5  . antipyrine-benzocaine (AURALGAN) otic solution Place 3-4 drops into the right ear every 2 (two) hours as needed for ear pain.  10 mL  0  . atorvastatin (LIPITOR) 80 MG tablet Take 1 tablet (80 mg total) by mouth daily.  30 tablet  2  . esomeprazole (NEXIUM) 40 MG capsule Take 40 mg by mouth daily before breakfast.      . lisinopril-hydrochlorothiazide (PRINZIDE,ZESTORETIC) 20-25 MG per tablet Take 1 tablet by mouth daily.  30 tablet  2  . metoprolol succinate (TOPROL-XL) 50 MG 24 hr tablet Take 1 tablet (50 mg total) by mouth daily.  30 tablet  2  . oxyCODONE-acetaminophen (PERCOCET)  5-325 MG per tablet Take 1 tablet by mouth every 8 (eight) hours as needed for severe pain.  30 tablet  0  . predniSONE (DELTASONE) 20 MG tablet 3 tabs po day one, then 2 po daily x 4 days  11 tablet  0  . sertraline (ZOLOFT) 100 MG tablet Take 1 tablet (100 mg total) by mouth daily.  30 tablet  2  . sodium chloride (OCEAN) 0.65 % SOLN nasal spray Place 1 spray into both nostrils as needed for congestion.  1 Bottle  0  . tiotropium (SPIRIVA) 18 MCG inhalation capsule Place 1 capsule (18 mcg total) into inhaler and inhale daily.  30 capsule  12  . traMADol (ULTRAM) 50 MG tablet Take 1 tablet (50 mg total) by mouth every 6 (six) hours as needed.  15 tablet  0  . gabapentin (NEURONTIN) 300 MG capsule Take 1 capsule (300 mg total) by mouth at bedtime. 1 capsule at bedtime  30 capsule  2   No current facility-administered  medications on file prior to visit.     Review of Systems  Constitutional: Positive for fatigue and unexpected weight change. Negative for fever, activity change and appetite change.  HENT: Positive for ear discharge, ear pain and hearing loss.   Eyes: Negative for pain and visual disturbance.  Respiratory: Negative for cough, chest tightness, shortness of breath and wheezing.   Cardiovascular: Negative for chest pain and palpitations.  Gastrointestinal: Negative for nausea, abdominal pain, diarrhea and constipation.  Endocrine: Negative for cold intolerance, heat intolerance and polydipsia.  Musculoskeletal: Positive for neck pain. Negative for back pain and joint swelling.       Right sided neck lump and pain with touch   Skin: Negative for color change and pallor.  Allergic/Immunologic: Negative for environmental allergies and immunocompromised state.  Neurological: Negative for dizziness and headaches.       Objective:   Physical Exam  Nursing note and vitals reviewed. Constitutional: He is oriented to person, place, and time. He appears well-developed and well-nourished.  HENT:  Head: Normocephalic and atraumatic.  Right Ear: External ear normal.  Left Ear: External ear normal.  Nose: Nose normal.  Mouth/Throat: Oropharynx is clear and moist.  Palpable subcutaneous lump to right side of neck and posterior auricular. Tender to palpation.   Neck: Normal range of motion.  Some lymphadenopathy to right cervical/submandibular  Cardiovascular: Normal rate and regular rhythm.   Pulmonary/Chest: Effort normal and breath sounds normal. No respiratory distress. He has no wheezes.  Abdominal: Soft. Bowel sounds are normal.  Musculoskeletal: He exhibits tenderness. He exhibits no edema.  Decrease internal and external rotation of left hip. Positive SLR on left at 30 degrees. Tender to palpation at lumbosacral paraspinal muscles.   Neurological: He is alert and oriented to person,  place, and time.  Skin: Skin is warm and dry.  Psychiatric: He has a normal mood and affect. His behavior is normal.       Assessment:     Craig Harmon was seen today for follow-up and back pain.  Diagnoses and associated orders for this visit:  Fall at home Comments: 12/21/13 - DG Hip Complete Left; Future - DG Lumbar Spine Complete; Future - DG Hip Complete Left - DG Lumbar Spine Complete  Left hip pain - DG Hip Complete Left; Future - DG Hip Complete Left  Back pain, lumbosacral - DG Lumbar Spine Complete; Future - DG Lumbar Spine Complete  Loss of weight - TSH - T4, free - CBC  with Differential - Basic metabolic panel - CT Soft Tissue Neck W Contrast; Future - CT Soft Tissue Neck W Contrast  Ear drainage right - CT Soft Tissue Neck W Contrast; Future - CT Soft Tissue Neck W Contrast  Right ear pain - CT Soft Tissue Neck W Contrast; Future - CT Soft Tissue Neck W Contrast  Hearing loss in right ear  Lump in neck - CT Soft Tissue Neck W Contrast; Future - CT Soft Tissue Neck W Contrast  Mass of right submandibular region  Tobacco abuse - CT Soft Tissue Neck W Contrast; Future - CT Soft Tissue Neck W Contrast  Other Orders - metaxalone (SKELAXIN) 800 MG tablet; Take 1 tablet (800 mg total) by mouth 3 (three) times daily as needed for muscle spasms. - meloxicam (MOBIC) 15 MG tablet; Take 1 tab po daily as needed for pain with food        Plan:     Due to continued lump in neck with pain and right ear drainage with pain and decrease hearing, will send for Ct scan of soft tissue neck. He also has some unintentional weight loss and uses tobacco. If inconclusive scan, will send back to ENT. To follow up in 1 week. Will get blood work today to access due to weight loss.  Tx with mobic and skelaxin prn for back and hip pain. Will send for xray of lumbar spine and left hip.

## 2013-12-24 NOTE — Addendum Note (Signed)
Addended by: Kela MillinBARRINO, Jibreel Fedewa Y on: 12/24/2013 02:14 PM   Modules accepted: Orders, Medications

## 2013-12-24 NOTE — Patient Instructions (Signed)
Back Pain, Adult Low back pain is very common. About 1 in 5 people have back pain.The cause of low back pain is rarely dangerous. The pain often gets better over time.About half of people with a sudden onset of back pain feel better in just 2 weeks. About 8 in 10 people feel better by 6 weeks.  CAUSES Some common causes of back pain include:  Strain of the muscles or ligaments supporting the spine.  Wear and tear (degeneration) of the spinal discs.  Arthritis.  Direct injury to the back. DIAGNOSIS Most of the time, the direct cause of low back pain is not known.However, back pain can be treated effectively even when the exact cause of the pain is unknown.Answering your caregiver's questions about your overall health and symptoms is one of the most accurate ways to make sure the cause of your pain is not dangerous. If your caregiver needs more information, he or she may order lab work or imaging tests (X-rays or MRIs).However, even if imaging tests show changes in your back, this usually does not require surgery. HOME CARE INSTRUCTIONS For many people, back pain returns.Since low back pain is rarely dangerous, it is often a condition that people can learn to manageon their own.   Remain active. It is stressful on the back to sit or stand in one place. Do not sit, drive, or stand in one place for more than 30 minutes at a time. Take short walks on level surfaces as soon as pain allows.Try to increase the length of time you walk each day.  Do not stay in bed.Resting more than 1 or 2 days can delay your recovery.  Do not avoid exercise or work.Your body is made to move.It is not dangerous to be active, even though your back may hurt.Your back will likely heal faster if you return to being active before your pain is gone.  Pay attention to your body when you bend and lift. Many people have less discomfortwhen lifting if they bend their knees, keep the load close to their bodies,and  avoid twisting. Often, the most comfortable positions are those that put less stress on your recovering back.  Find a comfortable position to sleep. Use a firm mattress and lie on your side with your knees slightly bent. If you lie on your back, put a pillow under your knees.  Only take over-the-counter or prescription medicines as directed by your caregiver. Over-the-counter medicines to reduce pain and inflammation are often the most helpful.Your caregiver may prescribe muscle relaxant drugs.These medicines help dull your pain so you can more quickly return to your normal activities and healthy exercise.  Put ice on the injured area.  Put ice in a plastic bag.  Place a towel between your skin and the bag.  Leave the ice on for 15-20 minutes, 03-04 times a day for the first 2 to 3 days. After that, ice and heat may be alternated to reduce pain and spasms.  Ask your caregiver about trying back exercises and gentle massage. This may be of some benefit.  Avoid feeling anxious or stressed.Stress increases muscle tension and can worsen back pain.It is important to recognize when you are anxious or stressed and learn ways to manage it.Exercise is a great option. SEEK MEDICAL CARE IF:  You have pain that is not relieved with rest or medicine.  You have pain that does not improve in 1 week.  You have new symptoms.  You are generally not feeling well. SEEK   IMMEDIATE MEDICAL CARE IF:   You have pain that radiates from your back into your legs.  You develop new bowel or bladder control problems.  You have unusual weakness or numbness in your arms or legs.  You develop nausea or vomiting.  You develop abdominal pain.  You feel faint. Document Released: 08/21/2005 Document Revised: 02/20/2012 Document Reviewed: 01/09/2011 [employer] Patient Information 2014 [employer].  

## 2013-12-24 NOTE — Telephone Encounter (Signed)
Sent Flexeril thanks

## 2013-12-24 NOTE — Telephone Encounter (Signed)
Linda from pharmacy called and left VM stating that the medication, Skelaxin, was not covered by insurance. She stated that his insurance would pay for Baclofen or  Flexeril. Can he use either of these? If so, can you please resend?

## 2013-12-25 LAB — BASIC METABOLIC PANEL
BUN: 14 mg/dL (ref 6–23)
CO2: 24 meq/L (ref 19–32)
CREATININE: 0.68 mg/dL (ref 0.50–1.35)
Calcium: 9.3 mg/dL (ref 8.4–10.5)
Chloride: 103 mEq/L (ref 96–112)
GLUCOSE: 96 mg/dL (ref 70–99)
Potassium: 4 mEq/L (ref 3.5–5.3)
SODIUM: 135 meq/L (ref 135–145)

## 2013-12-25 LAB — T4, FREE: Free T4: 1.19 ng/dL (ref 0.80–1.80)

## 2013-12-25 LAB — TSH: TSH: 0.758 u[IU]/mL (ref 0.350–4.500)

## 2013-12-31 ENCOUNTER — Ambulatory Visit: Payer: Medicare Other | Admitting: Family Medicine

## 2014-01-02 ENCOUNTER — Ambulatory Visit (HOSPITAL_COMMUNITY): Payer: Medicare Other

## 2014-01-06 ENCOUNTER — Encounter: Payer: Self-pay | Admitting: Family Medicine

## 2014-01-06 ENCOUNTER — Ambulatory Visit (INDEPENDENT_AMBULATORY_CARE_PROVIDER_SITE_OTHER): Payer: Medicare Other | Admitting: Family Medicine

## 2014-01-06 VITALS — BP 134/80 | HR 78 | Temp 97.6°F | Resp 18 | Ht 67.5 in | Wt 201.2 lb

## 2014-01-06 DIAGNOSIS — H9191 Unspecified hearing loss, right ear: Secondary | ICD-10-CM

## 2014-01-06 DIAGNOSIS — M25559 Pain in unspecified hip: Secondary | ICD-10-CM

## 2014-01-06 DIAGNOSIS — M25552 Pain in left hip: Secondary | ICD-10-CM

## 2014-01-06 DIAGNOSIS — J449 Chronic obstructive pulmonary disease, unspecified: Secondary | ICD-10-CM

## 2014-01-06 DIAGNOSIS — R220 Localized swelling, mass and lump, head: Secondary | ICD-10-CM

## 2014-01-06 DIAGNOSIS — M549 Dorsalgia, unspecified: Secondary | ICD-10-CM

## 2014-01-06 DIAGNOSIS — IMO0001 Reserved for inherently not codable concepts without codable children: Secondary | ICD-10-CM

## 2014-01-06 DIAGNOSIS — R221 Localized swelling, mass and lump, neck: Secondary | ICD-10-CM

## 2014-01-06 DIAGNOSIS — M5137 Other intervertebral disc degeneration, lumbosacral region: Secondary | ICD-10-CM

## 2014-01-06 DIAGNOSIS — H9190 Unspecified hearing loss, unspecified ear: Secondary | ICD-10-CM

## 2014-01-06 DIAGNOSIS — R634 Abnormal weight loss: Secondary | ICD-10-CM

## 2014-01-06 DIAGNOSIS — F17200 Nicotine dependence, unspecified, uncomplicated: Secondary | ICD-10-CM

## 2014-01-06 DIAGNOSIS — M51379 Other intervertebral disc degeneration, lumbosacral region without mention of lumbar back pain or lower extremity pain: Secondary | ICD-10-CM

## 2014-01-06 MED ORDER — DICLOFENAC SODIUM 1 % TD GEL: 2.0000 g | Freq: Four times a day (QID) | TRANSDERMAL | Status: DC

## 2014-01-06 NOTE — Patient Instructions (Signed)

## 2014-01-06 NOTE — Progress Notes (Signed)
Subjective:     Patient ID: Craig Harmon, male   DOB: Jan 14, 1951, 63 y.o.   MRN: [id number]  HPI Comments: Mr Craig Harmon is a 63 y.o WM here for follow up.  He was seen last week for left hip pain and back pain after a fall at home. Xrays were done and showed DDD with disc space narrowing of lumbosacral spine. He has been given Mobic and says this helps with the pain. He has a cane that he uses because of the back and left hip pain. He rates the pain 7/10 but relieved down to 3/10 with the medicine.  He has a hx of back problems and has had [facility] years ago but he says he is unsure exactly what was done.   He has also been seen for this right ear pain with drainage and hearing loss, along with a right submandibular mass. He has been given 2 courses of antibiotics for this mass which has helped shrink the mass but he still has some pain and a slight lump in the area. He no longer has right ear drainage but still with hearing loss and right ear pain. He has been sent to audiology for testing and has this appt on May 12. He has also seen ENT for this mass and ear problem where a scope was done to look down at his larynx and vocal cords with no abnormalities seen. He was diagnosed with submandibular sialoadenitis from CT scan that was done in ED when he went for this on Jan 20.    He does have a hx of tobacco abuse and has continued to have this swelling in his neck and pain along with weight loss. Today on ROS, he has stated that he has night sweats all the time.   Past Medical History  Diagnosis Date  . HTN (hypertension)   . CAD (coronary artery disease)   . HLD (hyperlipidemia)   . Tobacco abuse   . Hx of CABG   . GERD (gastroesophageal reflux disease)    Current Outpatient Prescriptions on File Prior to Visit  Medication Sig Dispense Refill  . albuterol (PROVENTIL HFA;VENTOLIN HFA) 108 (90 BASE) MCG/ACT inhaler Inhale 2 puffs into the lungs every 6 (six) hours as  needed for wheezing or shortness of breath.  1 Inhaler  5  . antipyrine-benzocaine (AURALGAN) otic solution Place 3-4 drops into the right ear every 2 (two) hours as needed for ear pain.  10 mL  0  . atorvastatin (LIPITOR) 80 MG tablet Take 1 tablet (80 mg total) by mouth daily.  30 tablet  2  . cyclobenzaprine (FLEXERIL) 5 MG tablet Take 1 tablet (5 mg total) by mouth 3 (three) times daily as needed for muscle spasms.  30 tablet  0  . esomeprazole (NEXIUM) 40 MG capsule Take 40 mg by mouth daily before breakfast.      . gabapentin (NEURONTIN) 300 MG capsule Take 1 capsule (300 mg total) by mouth at bedtime. 1 capsule at bedtime  30 capsule  2  . lisinopril-hydrochlorothiazide (PRINZIDE,ZESTORETIC) 20-25 MG per tablet Take 1 tablet by mouth daily.  30 tablet  2  . meloxicam (MOBIC) 15 MG tablet Take 1 tab po daily as needed for pain with food  30 tablet  0  . metoprolol succinate (TOPROL-XL) 50 MG 24 hr tablet Take 1 tablet (50 mg total) by mouth daily.  30 tablet  2  . oxyCODONE-acetaminophen (PERCOCET) 5-325 MG per tablet  Take 1 tablet by mouth every 8 (eight) hours as needed for severe pain.  30 tablet  0  . sertraline (ZOLOFT) 100 MG tablet Take 1 tablet (100 mg total) by mouth daily.  30 tablet  2  . sodium chloride (OCEAN) 0.65 % SOLN nasal spray Place 1 spray into both nostrils as needed for congestion.  1 Bottle  0  . tiotropium (SPIRIVA) 18 MCG inhalation capsule Place 1 capsule (18 mcg total) into inhaler and inhale daily.  30 capsule  12  . traMADol (ULTRAM) 50 MG tablet Take 1 tablet (50 mg total) by mouth every 6 (six) hours as needed.  15 tablet  0   No current facility-administered medications on file prior to visit.   No Known Allergies   Review of Systems  Constitutional: Positive for diaphoresis and unexpected weight change. Negative for fever, chills, activity change, appetite change and fatigue.  HENT: Positive for ear pain, facial swelling, hearing loss and trouble  swallowing. Negative for congestion, dental problem, sore throat, tinnitus and voice change.   Eyes: Negative for pain, redness and visual disturbance.  Respiratory: Positive for cough. Negative for chest tightness, shortness of breath and wheezing.   Cardiovascular: Negative for chest pain and palpitations.  Gastrointestinal: Negative for nausea, abdominal pain, diarrhea and constipation.  Endocrine: Negative for cold intolerance, heat intolerance, polydipsia and polyuria.  Genitourinary: Negative for frequency, difficulty urinating and testicular pain.  Musculoskeletal: Positive for back pain and gait problem. Negative for joint swelling and myalgias.  Skin: Negative for color change and rash.  Allergic/Immunologic: Negative for environmental allergies and immunocompromised state.  Neurological: Negative for dizziness, numbness and headaches.  Hematological: Negative for adenopathy. Does not bruise/bleed easily.  Psychiatric/Behavioral: Negative for confusion and agitation.       Objective:   Physical Exam  Vitals reviewed. Constitutional: He is oriented to person, place, and time. He appears well-developed and well-nourished.  HENT:  Head: Normocephalic and atraumatic.  Right Ear: External ear normal.  Left Ear: External ear normal.  Nose: Nose normal.  Mouth/Throat: Oropharynx is clear and moist.  Eyes: Pupils are equal, round, and reactive to light.  Neck: Normal range of motion.  R>L painful lymphadenopathy   Cardiovascular: Normal rate, regular rhythm and normal heart sounds.   Pulmonary/Chest: Effort normal and breath sounds normal.  Abdominal: Soft.  Lymphadenopathy:    He has cervical adenopathy.  Neurological: He is alert and oriented to person, place, and time.  Skin: Skin is warm.  Psychiatric: He has a normal mood and affect. His behavior is normal.       Assessment:     Gerlene BurdockRichard was seen today for follow-up.  Diagnoses and associated orders for this  visit:  COPD bronchitis - DG Chest 2 View; Future - DG Chest 2 View  Tobacco use disorder - DG Chest 2 View; Future - DG Chest 2 View  Unintentional weight loss of more than 10 pounds in 90 days - DG Chest 2 View; Future - DG Chest 2 View  Mass of right submandibular region  Hearing loss in right ear  Left hip pain - Ambulatory referral to Physical Therapy  DDD (degenerative disc disease), lumbosacral  Back pain with radiation - Ambulatory referral to Physical Therapy  Other Orders - diclofenac sodium (VOLTAREN) 1 % GEL; Apply 2 g topically 4 (four) times daily.       Plan:     CT scan of soft tissue neck ordered during last visit  But patient couldn't  be reached and missed this appt on May 1. Receptionist contacted imaging and this was rescheduled for May 7. He will wait on this date to get his CXR as well.  With weight loss, night sweats, and hx of tobacco abuse, worrisome for malignant conditions and will rule out with imaging, i.e lymphoma.  Also hasn't had xray of chest in over a year and will get this as well.   To send for PT for lumbosacral and hip pain. Have sent in Rx for diclofenac to use as well.   To go May 12 for hearing test due to hearing loss.

## 2014-01-08 ENCOUNTER — Ambulatory Visit (HOSPITAL_COMMUNITY)
Admission: RE | Admit: 2014-01-08 | Discharge: 2014-01-08 | Disposition: A | Discharge status: Home / Self Care | Payer: Medicare Other | Source: Ambulatory Visit | Attending: Family Medicine | Admitting: Family Medicine

## 2014-01-08 DIAGNOSIS — J3489 Other specified disorders of nose and nasal sinuses: Secondary | ICD-10-CM | POA: Insufficient documentation

## 2014-01-08 DIAGNOSIS — R221 Localized swelling, mass and lump, neck: Secondary | ICD-10-CM

## 2014-01-08 DIAGNOSIS — M542 Cervicalgia: Secondary | ICD-10-CM | POA: Insufficient documentation

## 2014-01-08 DIAGNOSIS — H9210 Otorrhea, unspecified ear: Secondary | ICD-10-CM | POA: Insufficient documentation

## 2014-01-08 DIAGNOSIS — R220 Localized swelling, mass and lump, head: Secondary | ICD-10-CM | POA: Insufficient documentation

## 2014-01-08 MED ORDER — IOHEXOL 300 MG/ML  SOLN
75.0000 mL | Freq: Once | INTRAMUSCULAR | Status: AC | PRN
  Administered 2014-01-08: 75 mL via INTRAVENOUS

## 2014-01-13 ENCOUNTER — Ambulatory Visit: Payer: Medicare Other | Attending: Family Medicine | Admitting: Audiology

## 2014-01-13 DIAGNOSIS — H93299 Other abnormal auditory perceptions, unspecified ear: Secondary | ICD-10-CM

## 2014-01-13 DIAGNOSIS — H903 Sensorineural hearing loss, bilateral: Secondary | ICD-10-CM | POA: Insufficient documentation

## 2014-01-13 DIAGNOSIS — H9311 Tinnitus, right ear: Secondary | ICD-10-CM
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
## 2014-02-24 ENCOUNTER — Encounter: Payer: Medicare Other | Admitting: Cardiology

## 2014-02-24 NOTE — Progress Notes (Signed)
    ERROR  Jonathan F. Branch, M.D., F.A.C.C.  

## 2014-03-12 ENCOUNTER — Ambulatory Visit (INDEPENDENT_AMBULATORY_CARE_PROVIDER_SITE_OTHER): Payer: Medicare Other | Admitting: Otolaryngology

## 2014-03-12 DIAGNOSIS — H903 Sensorineural hearing loss, bilateral: Secondary | ICD-10-CM | POA: Diagnosis not present

## 2015-03-18 ENCOUNTER — Ambulatory Visit (INDEPENDENT_AMBULATORY_CARE_PROVIDER_SITE_OTHER): Payer: Medicare Other | Admitting: Otolaryngology

## 2015-03-18 DIAGNOSIS — H903 Sensorineural hearing loss, bilateral: Secondary | ICD-10-CM | POA: Diagnosis not present

## 2016-08-22 ENCOUNTER — Ambulatory Visit (INDEPENDENT_AMBULATORY_CARE_PROVIDER_SITE_OTHER): Payer: Medicare Other | Admitting: Orthopaedic Surgery

## 2016-08-22 ENCOUNTER — Encounter: Payer: Self-pay | Admitting: Orthopaedic Surgery

## 2016-08-22 VITALS — BP 108/70 | HR 90 | Temp 97.5°F | Ht 68.0 in | Wt 207.0 lb

## 2016-08-22 DIAGNOSIS — F17210 Nicotine dependence, cigarettes, uncomplicated: Secondary | ICD-10-CM | POA: Diagnosis not present

## 2016-08-22 DIAGNOSIS — M6530 Trigger finger, unspecified finger: Secondary | ICD-10-CM | POA: Diagnosis not present

## 2016-08-22 NOTE — Patient Instructions (Signed)
Steps to Quit Smoking Smoking tobacco can be bad for your health. It can also affect almost every organ in your body. Smoking puts you and people around you at risk for many serious Craig Harmon-lasting (chronic) diseases. Quitting smoking is hard, but it is one of the best things that you can do for your health. It is never too late to quit. What are the benefits of quitting smoking? When you quit smoking, you lower your risk for getting serious diseases and conditions. They can include:  Lung cancer or lung disease.  Heart disease.  Stroke.  Heart attack.  Not being able to have children (infertility).  Weak bones (osteoporosis) and broken bones (fractures). If you have coughing, wheezing, and shortness of breath, those symptoms may get better when you quit. You may also get sick less often. If you are pregnant, quitting smoking can help to lower your chances of having a baby of low birth weight. What can I do to help me quit smoking? Talk with your doctor about what can help you quit smoking. Some things you can do (strategies) include:  Quitting smoking totally, instead of slowly cutting back how much you smoke over a period of time.  Going to in-person counseling. You are more likely to quit if you go to many counseling sessions.  Using resources and support systems, such as:  Online chats with a counselor.  Phone quitlines.  Printed self-help materials.  Support groups or group counseling.  Text messaging programs.  Mobile phone apps or applications.  Taking medicines. Some of these medicines may have nicotine in them. If you are pregnant or breastfeeding, do not take any medicines to quit smoking unless your doctor says it is okay. Talk with your doctor about counseling or other things that can help you. Talk with your doctor about using more than one strategy at the same time, such as taking medicines while you are also going to in-person counseling. This can help make quitting  easier. What things can I do to make it easier to quit? Quitting smoking might feel very hard at first, but there is a lot that you can do to make it easier. Take these steps:  Talk to your family and friends. Ask them to support and encourage you.  Call phone quitlines, reach out to support groups, or work with a counselor.  Ask people who smoke to not smoke around you.  Avoid places that make you want (trigger) to smoke, such as:  Bars.  Parties.  Smoke-break areas at work.  Spend time with people who do not smoke.  Lower the stress in your life. Stress can make you want to smoke. Try these things to help your stress:  Getting regular exercise.  Deep-breathing exercises.  Yoga.  Meditating.  Doing a body scan. To do this, close your eyes, focus on one area of your body at a time from head to toe, and notice which parts of your body are tense. Try to relax the muscles in those areas.  Download or buy apps on your mobile phone or tablet that can help you stick to your quit plan. There are many free apps, such as QuitGuide from the CDC (Centers for Disease Control and Prevention). You can find more support from smokefree.gov and other websites. This information is not intended to replace advice given to you by your health care provider. Make sure you discuss any questions you have with your health care provider. Document Released: 06/17/2009 Document Revised: 04/18/2016 Document   Reviewed: 01/05/2015 Elsevier Interactive Patient Education  2017 Elsevier Inc.  

## 2016-08-22 NOTE — Progress Notes (Signed)
Subjective:    Patient ID: Craig Harmon, male    DOB: [birth year], 65 y.o.   MRN: [id number]  HPI He has had triggering of the left long finger for about a month.  [facility].  He has no trauma.  He has no redness.  It locks early in the morning and will awaken him.  He has no other hand problem.  He is post carpal tunnel right in the past.  He smokes and is not willing to quit.  Review of Systems  HENT: Negative for congestion.   Respiratory: Negative for cough and shortness of breath.   Cardiovascular: Negative for chest pain and leg swelling.  Endocrine: Negative for cold intolerance.  Musculoskeletal: Positive for arthralgias.  Allergic/Immunologic: Negative for environmental allergies.   Past Medical History:  Diagnosis Date  . Arthritis   . CAD (coronary artery disease)   . GERD (gastroesophageal reflux disease)   . HLD (hyperlipidemia)   . HTN (hypertension)   . Hx of CABG   . Tobacco abuse     Past Surgical History:  Procedure Laterality Date  . BACK SURGERY    . CORONARY ARTERY BYPASS GRAFT  2008    Current Outpatient Prescriptions on File Prior to Visit  Medication Sig Dispense Refill  . albuterol (PROVENTIL HFA;VENTOLIN HFA) 108 (90 BASE) MCG/ACT inhaler Inhale 2 puffs into the lungs every 6 (six) hours as needed for wheezing or shortness of breath. 1 Inhaler 5  . atorvastatin (LIPITOR) 80 MG tablet Take 1 tablet (80 mg total) by mouth daily. 30 tablet 2  . lisinopril-hydrochlorothiazide (PRINZIDE,ZESTORETIC) 20-25 MG per tablet Take 1 tablet by mouth daily. 30 tablet 2  . metoprolol succinate (TOPROL-XL) 50 MG 24 hr tablet Take 1 tablet (50 mg total) by mouth daily. 30 tablet 2  . antipyrine-benzocaine (AURALGAN) otic solution Place 3-4 drops into the right ear every 2 (two) hours as needed for ear pain. (Patient not taking: Reported on 08/22/2016) 10 mL 0   No current facility-administered medications on file prior to visit.      Social History   Social History  . Marital status: Legally Separated    Spouse name: N/A  . Number of children: N/A  . Years of education: N/A   Occupational History  . Not on file.   Social History Main Topics  . Smoking status: Current Every Day Smoker    Packs/day: 0.50    Years: 45.00    Types: Cigarettes  . Smokeless tobacco: Never Used  . Alcohol use Not on file  . Drug use: Unknown  . Sexual activity: Not on file   Other Topics Concern  . Not on file   Social History Narrative  . No narrative on file    Family History  Problem Relation Age of Onset  . Heart attack Mother   . Alcohol abuse Father   . CAD Brother     BP 108/70   Pulse 90   Temp 97.5 F (36.4 C)   Ht 5\' 8"  (1.727 m)   Wt 207 lb (93.9 kg)   BMI 31.47 kg/m       Objective:   Physical Exam  Constitutional: He is oriented to person, place, and time. He appears well-developed and well-nourished.  HENT:  Head: Normocephalic and atraumatic.  Eyes: Conjunctivae and EOM are normal. Pupils are equal, round, and reactive to light.  Neck: Normal range of motion. Neck supple.  Cardiovascular: Normal rate,  regular rhythm and intact distal pulses.   Pulmonary/Chest: Effort normal.  Abdominal: Soft.  Musculoskeletal: He exhibits tenderness (He has triggering of the left long finger.  NV is intact.  He has no redness or swelling.  Right hand negative.).  Neurological: He is alert and oriented to person, place, and time. He has normal reflexes. He displays normal reflexes. No cranial nerve deficit. He exhibits normal muscle tone. Coordination normal.  Skin: Skin is warm and dry.  Psychiatric: He has a normal mood and affect. His behavior is normal. Judgment and thought content normal.          Assessment & Plan:   Encounter Diagnoses  Name Primary?  . Trigger finger, acquired Yes  . Cigarette nicotine dependence without complication    PROCEDURE  Trigger Finger Injection  The left  Middle finger has been locking at the A1 pulley.  The patient has been told about injection of the digit.  Surgical correction and excision of the A1 pulley will resolve the problem.  Ani injection in the digit should help but the results may be short lived.  The patient asked appropriate questions and understands the procedure.  The patient has elected for an injection at this time.  Verbal consent was obtained.  A timeout was taken to confirm the proper hand and digit.  Medication  1 mL of 40 mg Depo-Medrol  2 mL of 1% lidocaine plain  Ethyl chloride for anesthesia  Alcohol was used to prepare the skin along with ethyl chloride and then the injection was made at the A1 pulley there were no complications.  It was tolerated well.  A Band-aid dressing was applied.  Call if any problem or difficulty.  Return in one month.  He was told surgery is the definitive treatment but he wanted the injection today.  Electronically Signed Darreld McleanWayne Masud Holub, MD 12/19/20173:12 PM

## 2016-09-19 ENCOUNTER — Ambulatory Visit: Payer: Medicare Other | Admitting: Orthopaedic Surgery

## 2016-09-19 ENCOUNTER — Encounter: Payer: Self-pay | Admitting: Orthopaedic Surgery

## 2016-11-23 ENCOUNTER — Ambulatory Visit (INDEPENDENT_AMBULATORY_CARE_PROVIDER_SITE_OTHER): Payer: Medicare Other

## 2016-11-23 ENCOUNTER — Ambulatory Visit (INDEPENDENT_AMBULATORY_CARE_PROVIDER_SITE_OTHER): Payer: Medicare Other | Admitting: Orthopaedic Surgery

## 2016-11-23 ENCOUNTER — Encounter: Payer: Self-pay | Admitting: Orthopaedic Surgery

## 2016-11-23 VITALS — BP 142/56 | HR 73 | Temp 97.7°F | Ht 68.0 in | Wt 200.0 lb

## 2016-11-23 DIAGNOSIS — F17210 Nicotine dependence, cigarettes, uncomplicated: Secondary | ICD-10-CM

## 2016-11-23 DIAGNOSIS — M25562 Pain in left knee: Secondary | ICD-10-CM

## 2016-11-23 DIAGNOSIS — G8929 Other chronic pain: Secondary | ICD-10-CM

## 2016-11-23 NOTE — Progress Notes (Signed)
[name] CHIOKE NOXON, male DOB:12-10-1950, 66 y.o. WUJ:[id number]  Chief Complaint  Patient presents with  . Knee Pain    left knee pain    HPI  Craig Harmon is a 66 y.o. male who has left knee pain.  He had surgery about a year ago in [place] and did well until about a month ago. He has swelling and popping but no giving way.  He has no redness, no trauma.  He is not taking any NSAID.  Heat and ice help some.  Dr. Selena Batten asked that he be seen here. HPI  Body mass index is 30.41 kg/m.  ROS  Review of Systems  HENT: Negative for congestion.   Respiratory: Negative for cough and shortness of breath.   Cardiovascular: Negative for chest pain and leg swelling.  Endocrine: Negative for cold intolerance.  Musculoskeletal: Positive for arthralgias.  Allergic/Immunologic: Negative for environmental allergies.    Past Medical History:  Diagnosis Date  . Arthritis   . CAD (coronary artery disease)   . GERD (gastroesophageal reflux disease)   . HLD (hyperlipidemia)   . HTN (hypertension)   . Hx of CABG   . Tobacco abuse     Past Surgical History:  Procedure Laterality Date  . BACK SURGERY    . CORONARY ARTERY BYPASS GRAFT  2008    Family History  Problem Relation Age of Onset  . Heart attack Mother   . Alcohol abuse Father   . CAD Brother     Social History Social History  Substance Use Topics  . Smoking status: Current Every Day Smoker    Packs/day: 0.50    Years: 45.00    Types: Cigarettes  . Smokeless tobacco: Never Used  . Alcohol use Not on file    No Known Allergies  Current Outpatient Prescriptions  Medication Sig Dispense Refill  . albuterol (PROVENTIL HFA;VENTOLIN HFA) 108 (90 BASE) MCG/ACT inhaler Inhale 2 puffs into the lungs every 6 (six) hours as needed for wheezing or shortness of breath. 1 Inhaler 5  . antipyrine-benzocaine (AURALGAN) otic solution Place 3-4 drops into the right ear every 2 (two) hours as needed for ear pain. (Patient not  taking: Reported on 08/22/2016) 10 mL 0  . atorvastatin (LIPITOR) 80 MG tablet Take 1 tablet (80 mg total) by mouth daily. 30 tablet 2  . Cholecalciferol (VITAMIN D3) 1000 units CAPS Take by mouth.    . esomeprazole (NEXIUM) 20 MG capsule Take 20 mg by mouth daily at 12 noon.    . furosemide (LASIX) 40 MG tablet Take 40 mg by mouth.    . hydrochlorothiazide (HYDRODIURIL) 25 MG tablet Take 25 mg by mouth daily.    Marland Kitchen ibuprofen (ADVIL,MOTRIN) 800 MG tablet Take 800 mg by mouth every 8 (eight) hours as needed.    Marland Kitchen lisinopril-hydrochlorothiazide (PRINZIDE,ZESTORETIC) 20-25 MG per tablet Take 1 tablet by mouth daily. 30 tablet 2  . metoprolol succinate (TOPROL-XL) 50 MG 24 hr tablet Take 1 tablet (50 mg total) by mouth daily. 30 tablet 2  . predniSONE (DELTASONE) 20 MG tablet Take 20 mg by mouth daily with breakfast.    . umeclidinium bromide (INCRUSE ELLIPTA) 62.5 MCG/INH AEPB Inhale into the lungs.     No current facility-administered medications for this visit.      Physical Exam  Blood pressure (!) 142/56, pulse 73, temperature 97.7 F (36.5 C), height 5\' 8"  (1.727 m), weight 200 lb (90.7 kg).  Constitutional: overall normal hygiene, normal nutrition, well developed,  normal grooming, normal body habitus. Assistive device:none  Musculoskeletal: gait and station Limp left, muscle tone and strength are normal, no tremors or atrophy is present.  .  Neurological: coordination overall normal.  Deep tendon reflex/nerve stretch intact.  Sensation normal.  Cranial nerves II-XII intact.   Skin:   Normal overall no scars, lesions, ulcers or rashes. No psoriasis.  Psychiatric: Alert and oriented x 3.  Recent memory intact, remote memory unclear.  Normal mood and affect. Well groomed.  Good eye contact.  Cardiovascular: overall no swelling, no varicosities, no edema bilaterally, normal temperatures of the legs and arms, no clubbing, cyanosis and good capillary refill.  Lymphatic: palpation is  normal.  The left lower extremity is examined:  Inspection:  Thigh:  Non-tender and no defects  Knee has swelling 1+ effusion.                        Joint tenderness is present                        Patient is tender over the medial joint line  Lower Leg:  Has normal appearance and no tenderness or defects  Ankle:  Non-tender and no defects  Foot:  Non-tender and no defects Range of Motion:  Knee:  Range of motion is: 0-105                        Crepitus is  present  Ankle:  Range of motion is normal. Strength and Tone:  The left lower extremity has normal strength and tone. Stability:  Knee:  The knee is stable.  Ankle:  The ankle is stable.   X-rays were done of the left knee, reported separately.  The patient has been educated about the nature of the problem(s) and counseled on treatment options.  The patient appeared to understand what I have discussed and is in agreement with it.  Encounter Diagnoses  Name Primary?  . Chronic pain of left knee Yes  . Cigarette nicotine dependence without complication    PROCEDURE NOTE:  The patient requests injections of the left knee , verbal consent was obtained.  The left knee was prepped appropriately after time out was performed.   Sterile technique was observed and injection of 1 cc of Depo-Medrol 40 mg with several cc's of plain xylocaine. Anesthesia was provided by ethyl chloride and a 20-gauge needle was used to inject the knee area. The injection was tolerated well.  A band aid dressing was applied.  The patient was advised to apply ice later today and tomorrow to the injection sight as needed.  PLAN Call if any problems.  Precautions discussed.  Continue current medications.   Return to clinic 3 weeks   He needs to sign a release for information to get his records from [place].  Electronically Signed Darreld McleanWayne Cynthia Cogle, MD 3/22/20183:07 PM

## 2016-11-23 NOTE — Patient Instructions (Signed)
Steps to Quit Smoking Smoking tobacco can be bad for your health. It can also affect almost every organ in your body. Smoking puts you and people around you at risk for many serious long-lasting (chronic) diseases. Quitting smoking is hard, but it is one of the best things that you can do for your health. It is never too late to quit. What are the benefits of quitting smoking? When you quit smoking, you lower your risk for getting serious diseases and conditions. They can include:  Lung cancer or lung disease.  Heart disease.  Stroke.  Heart attack.  Not being able to have children (infertility).  Weak bones (osteoporosis) and broken bones (fractures). If you have coughing, wheezing, and shortness of breath, those symptoms may get better when you quit. You may also get sick less often. If you are pregnant, quitting smoking can help to lower your chances of having a baby of low birth weight. What can I do to help me quit smoking? Talk with your doctor about what can help you quit smoking. Some things you can do (strategies) include:  Quitting smoking totally, instead of slowly cutting back how much you smoke over a period of time.  Going to in-person counseling. You are more likely to quit if you go to many counseling sessions.  Using resources and support systems, such as:  Online chats with a counselor.  Phone quitlines.  Printed self-help materials.  Support groups or group counseling.  Text messaging programs.  Mobile phone apps or applications.  Taking medicines. Some of these medicines may have nicotine in them. If you are pregnant or breastfeeding, do not take any medicines to quit smoking unless your doctor says it is okay. Talk with your doctor about counseling or other things that can help you. Talk with your doctor about using more than one strategy at the same time, such as taking medicines while you are also going to in-person counseling. This can help make quitting  easier. What things can I do to make it easier to quit? Quitting smoking might feel very hard at first, but there is a lot that you can do to make it easier. Take these steps:  Talk to your family and friends. Ask them to support and encourage you.  Call phone quitlines, reach out to support groups, or work with a counselor.  Ask people who smoke to not smoke around you.  Avoid places that make you want (trigger) to smoke, such as:  Bars.  Parties.  Smoke-break areas at work.  Spend time with people who do not smoke.  Lower the stress in your life. Stress can make you want to smoke. Try these things to help your stress:  Getting regular exercise.  Deep-breathing exercises.  Yoga.  Meditating.  Doing a body scan. To do this, close your eyes, focus on one area of your body at a time from head to toe, and notice which parts of your body are tense. Try to relax the muscles in those areas.  Download or buy apps on your mobile phone or tablet that can help you stick to your quit plan. There are many free apps, such as QuitGuide from the CDC (Centers for Disease Control and Prevention). You can find more support from smokefree.gov and other websites. This information is not intended to replace advice given to you by your health care provider. Make sure you discuss any questions you have with your health care provider. Document Released: 06/17/2009 Document Revised: 04/18/2016 Document   Reviewed: 01/05/2015 Elsevier Interactive Patient Education  2017 Elsevier Inc.  

## 2016-12-19 ENCOUNTER — Ambulatory Visit: Payer: Medicare Other | Admitting: Orthopaedic Surgery

## 2016-12-20 ENCOUNTER — Encounter: Payer: Self-pay | Admitting: Orthopaedic Surgery

## 2017-01-17 ENCOUNTER — Ambulatory Visit (INDEPENDENT_AMBULATORY_CARE_PROVIDER_SITE_OTHER): Payer: Medicare Other | Admitting: Orthopaedic Surgery

## 2017-01-17 ENCOUNTER — Encounter: Payer: Self-pay | Admitting: Orthopaedic Surgery

## 2017-01-17 VITALS — BP 112/76 | HR 81 | Temp 97.3°F | Ht 68.0 in | Wt 194.0 lb

## 2017-01-17 DIAGNOSIS — M25562 Pain in left knee: Secondary | ICD-10-CM

## 2017-01-17 DIAGNOSIS — F17210 Nicotine dependence, cigarettes, uncomplicated: Secondary | ICD-10-CM

## 2017-01-17 DIAGNOSIS — G8929 Other chronic pain: Secondary | ICD-10-CM

## 2017-01-17 NOTE — Progress Notes (Signed)
[name]:Craig Harmon [place], male DOB:09/14/1950, 66 y.o. WUJ:[id number]  Chief Complaint  Patient presents with  . Follow-up    Left knee pain    HPI  Craig Harmon is a 66 y.o. male who has left knee pain.  I got his records from [place] and he had arthroscopy of the left knee medial menisectomy on 08-19-15.  He is taking three Marlin CanaryGoody Powder packets a day and that helps his knee.  He has no locking, no swelling.  He has popping.  He has no new trauma.  I told him I do not think surgery is indicated now as no MRI is indicated.  He is improving. HPI  Body mass index is 29.5 kg/m.  ROS  Review of Systems  HENT: Negative for congestion.   Respiratory: Negative for cough and shortness of breath.   Cardiovascular: Negative for chest pain and leg swelling.  Endocrine: Negative for cold intolerance.  Musculoskeletal: Positive for arthralgias.  Allergic/Immunologic: Negative for environmental allergies.    Past Medical History:  Diagnosis Date  . Arthritis   . CAD (coronary artery disease)   . GERD (gastroesophageal reflux disease)   . HLD (hyperlipidemia)   . HTN (hypertension)   . Hx of CABG   . Tobacco abuse     Past Surgical History:  Procedure Laterality Date  . BACK SURGERY    . CORONARY ARTERY BYPASS GRAFT  2008    Family History  Problem Relation Age of Onset  . Heart attack Mother   . Alcohol abuse Father   . CAD Brother     Social History Social History  Substance Use Topics  . Smoking status: Current Every Day Smoker    Packs/day: 0.50    Years: 45.00    Types: Cigarettes  . Smokeless tobacco: Never Used  . Alcohol use Not on file    No Known Allergies  Current Outpatient Prescriptions  Medication Sig Dispense Refill  . albuterol (PROVENTIL HFA;VENTOLIN HFA) 108 (90 BASE) MCG/ACT inhaler Inhale 2 puffs into the lungs every 6 (six) hours as needed for wheezing or shortness of breath. 1 Inhaler 5  . antipyrine-benzocaine (AURALGAN) otic solution  Place 3-4 drops into the right ear every 2 (two) hours as needed for ear pain. 10 mL 0  . aspirin 81 MG chewable tablet Chew by mouth daily.    Marland Kitchen. atorvastatin (LIPITOR) 80 MG tablet Take 1 tablet (80 mg total) by mouth daily. 30 tablet 2  . Cholecalciferol (VITAMIN D3) 1000 units CAPS Take by mouth.    . esomeprazole (NEXIUM) 20 MG capsule Take 20 mg by mouth daily at 12 noon.    . furosemide (LASIX) 40 MG tablet Take 40 mg by mouth.    Marland Kitchen. lisinopril-hydrochlorothiazide (PRINZIDE,ZESTORETIC) 20-25 MG per tablet Take 1 tablet by mouth daily. 30 tablet 2  . metoprolol succinate (TOPROL-XL) 50 MG 24 hr tablet Take 1 tablet (50 mg total) by mouth daily. 30 tablet 2  . umeclidinium bromide (INCRUSE ELLIPTA) 62.5 MCG/INH AEPB Inhale into the lungs.    . hydrochlorothiazide (HYDRODIURIL) 25 MG tablet Take 25 mg by mouth daily.    Marland Kitchen. ibuprofen (ADVIL,MOTRIN) 800 MG tablet Take 800 mg by mouth every 8 (eight) hours as needed.    . predniSONE (DELTASONE) 20 MG tablet Take 20 mg by mouth daily with breakfast.     No current facility-administered medications for this visit.      Physical Exam  Blood pressure 112/76, pulse 81, temperature 97.3 F (  36.3 C), height 5\' 8"  (1.727 m), weight 194 lb (88 kg).  Constitutional: overall normal hygiene, normal nutrition, well developed, normal grooming, normal body habitus. Assistive device:none  Musculoskeletal: gait and station Limp none, muscle tone and strength are normal, no tremors or atrophy is present.  .  Neurological: coordination overall normal.  Deep tendon reflex/nerve stretch intact.  Sensation normal.  Cranial nerves II-XII intact.   Skin:   Normal overall no scars, lesions, ulcers or rashes. No psoriasis.  Psychiatric: Alert and oriented x 3.  Recent memory intact, remote memory unclear.  Normal mood and affect. Well groomed.  Good eye contact.  Cardiovascular: overall no swelling, no varicosities, no edema bilaterally, normal temperatures of  the legs and arms, no clubbing, cyanosis and good capillary refill.  Lymphatic: palpation is normal.  Left knee has ROM 0 - 115 with some crepitus more of patella.  He has no effusion. Gait is normal.  NV intact.  He has no redness or distal edema.  The patient has been educated about the nature of the problem(s) and counseled on treatment options.  The patient appeared to understand what I have discussed and is in agreement with it.  Encounter Diagnoses  Name Primary?  . Chronic pain of left knee Yes  . Cigarette nicotine dependence without complication     PLAN Call if any problems.  Precautions discussed.  Continue current medications.   Return to clinic 2 months   Electronically Signed Darreld Mclean, MD 5/16/20182:43 PM

## 2017-03-20 ENCOUNTER — Ambulatory Visit (INDEPENDENT_AMBULATORY_CARE_PROVIDER_SITE_OTHER): Payer: Medicare Other | Admitting: Orthopaedic Surgery

## 2017-03-20 ENCOUNTER — Encounter: Payer: Self-pay | Admitting: Orthopaedic Surgery

## 2017-03-20 VITALS — BP 127/78 | HR 75 | Temp 96.9°F | Ht 68.0 in | Wt 195.0 lb

## 2017-03-20 DIAGNOSIS — F17210 Nicotine dependence, cigarettes, uncomplicated: Secondary | ICD-10-CM | POA: Diagnosis not present

## 2017-03-20 DIAGNOSIS — G8929 Other chronic pain: Secondary | ICD-10-CM | POA: Diagnosis not present

## 2017-03-20 DIAGNOSIS — M25562 Pain in left knee: Secondary | ICD-10-CM | POA: Diagnosis not present

## 2017-03-20 NOTE — Progress Notes (Signed)
CC:  I have pain of my left knee. I would like an injection.  The patient has chronic pain of the left knee.  There is no recent trauma.  There is no redness.  Injections in the past have helped.  The knee has no redness, has an effusion and crepitus present.  ROM of the left knee is 0-110.  Impression:  Chronic knee pain left  Return: 1 month  PROCEDURE NOTE:  The patient requests injections of the left knee, verbal consent was obtained.  The left knee was prepped appropriately after time out was performed.   Sterile technique was observed and injection of 1 cc of Depo-Medrol 40 mg with several cc's of plain xylocaine. Anesthesia was provided by ethyl chloride and a 20-gauge needle was used to inject the knee area. The injection was tolerated well.  A band aid dressing was applied.  The patient was advised to apply ice later today and tomorrow to the injection sight as needed.  Electronically Signed Darreld McleanWayne Sherena Machorro, MD 7/17/20182:50 PM

## 2017-03-20 NOTE — Patient Instructions (Signed)
Steps to Quit Smoking Smoking tobacco can be bad for your health. It can also affect almost every organ in your body. Smoking puts you and people around you at risk for many serious Craig Harmon-lasting (chronic) diseases. Quitting smoking is hard, but it is one of the best things that you can do for your health. It is never too late to quit. What are the benefits of quitting smoking? When you quit smoking, you lower your risk for getting serious diseases and conditions. They can include:  Lung cancer or lung disease.  Heart disease.  Stroke.  Heart attack.  Not being able to have children (infertility).  Weak bones (osteoporosis) and broken bones (fractures).  If you have coughing, wheezing, and shortness of breath, those symptoms may get better when you quit. You may also get sick less often. If you are pregnant, quitting smoking can help to lower your chances of having a baby of low birth weight. What can I do to help me quit smoking? Talk with your doctor about what can help you quit smoking. Some things you can do (strategies) include:  Quitting smoking totally, instead of slowly cutting back how much you smoke over a period of time.  Going to in-person counseling. You are more likely to quit if you go to many counseling sessions.  Using resources and support systems, such as: ? Online chats with a counselor. ? Phone quitlines. ? Printed self-help materials. ? Support groups or group counseling. ? Text messaging programs. ? Mobile phone apps or applications.  Taking medicines. Some of these medicines may have nicotine in them. If you are pregnant or breastfeeding, do not take any medicines to quit smoking unless your doctor says it is okay. Talk with your doctor about counseling or other things that can help you.  Talk with your doctor about using more than one strategy at the same time, such as taking medicines while you are also going to in-person counseling. This can help make  quitting easier. What things can I do to make it easier to quit? Quitting smoking might feel very hard at first, but there is a lot that you can do to make it easier. Take these steps:  Talk to your family and friends. Ask them to support and encourage you.  Call phone quitlines, reach out to support groups, or work with a counselor.  Ask people who smoke to not smoke around you.  Avoid places that make you want (trigger) to smoke, such as: ? Bars. ? Parties. ? Smoke-break areas at work.  Spend time with people who do not smoke.  Lower the stress in your life. Stress can make you want to smoke. Try these things to help your stress: ? Getting regular exercise. ? Deep-breathing exercises. ? Yoga. ? Meditating. ? Doing a body scan. To do this, close your eyes, focus on one area of your body at a time from head to toe, and notice which parts of your body are tense. Try to relax the muscles in those areas.  Download or buy apps on your mobile phone or tablet that can help you stick to your quit plan. There are many free apps, such as QuitGuide from the CDC (Centers for Disease Control and Prevention). You can find more support from smokefree.gov and other websites.  This information is not intended to replace advice given to you by your health care provider. Make sure you discuss any questions you have with your health care provider. Document Released: 06/17/2009 Document   Revised: 04/18/2016 Document Reviewed: 01/05/2015 Elsevier Interactive Patient Education  2018 Elsevier Inc.  

## 2017-03-23 ENCOUNTER — Emergency Department (HOSPITAL_COMMUNITY): Payer: Medicare Other

## 2017-03-23 ENCOUNTER — Encounter (HOSPITAL_COMMUNITY): Payer: Self-pay | Admitting: *Deleted

## 2017-03-23 ENCOUNTER — Inpatient Hospital Stay (HOSPITAL_COMMUNITY)
Admission: EM | Admit: 2017-03-23 | Discharge: 2017-03-27 | DRG: 247 | Disposition: A | Discharge status: Home / Self Care | Payer: Medicare Other | Attending: Nephrology | Admitting: Nephrology

## 2017-03-23 DIAGNOSIS — I200 Unstable angina: Secondary | ICD-10-CM | POA: Diagnosis present

## 2017-03-23 DIAGNOSIS — E876 Hypokalemia: Secondary | ICD-10-CM | POA: Diagnosis present

## 2017-03-23 DIAGNOSIS — Z8249 Family history of ischemic heart disease and other diseases of the circulatory system: Secondary | ICD-10-CM | POA: Diagnosis not present

## 2017-03-23 DIAGNOSIS — Z9861 Coronary angioplasty status: Secondary | ICD-10-CM

## 2017-03-23 DIAGNOSIS — E785 Hyperlipidemia, unspecified: Secondary | ICD-10-CM | POA: Diagnosis present

## 2017-03-23 DIAGNOSIS — I959 Hypotension, unspecified: Secondary | ICD-10-CM | POA: Diagnosis not present

## 2017-03-23 DIAGNOSIS — E782 Mixed hyperlipidemia: Secondary | ICD-10-CM | POA: Diagnosis not present

## 2017-03-23 DIAGNOSIS — Z6829 Body mass index (BMI) 29.0-29.9, adult: Secondary | ICD-10-CM

## 2017-03-23 DIAGNOSIS — Z955 Presence of coronary angioplasty implant and graft: Secondary | ICD-10-CM

## 2017-03-23 DIAGNOSIS — I2584 Coronary atherosclerosis due to calcified coronary lesion: Secondary | ICD-10-CM | POA: Diagnosis present

## 2017-03-23 DIAGNOSIS — I25110 Atherosclerotic heart disease of native coronary artery with unstable angina pectoris: Secondary | ICD-10-CM | POA: Diagnosis present

## 2017-03-23 DIAGNOSIS — E440 Moderate protein-calorie malnutrition: Secondary | ICD-10-CM | POA: Diagnosis present

## 2017-03-23 DIAGNOSIS — I352 Nonrheumatic aortic (valve) stenosis with insufficiency: Secondary | ICD-10-CM | POA: Diagnosis present

## 2017-03-23 DIAGNOSIS — F17210 Nicotine dependence, cigarettes, uncomplicated: Secondary | ICD-10-CM | POA: Diagnosis present

## 2017-03-23 DIAGNOSIS — I361 Nonrheumatic tricuspid (valve) insufficiency: Secondary | ICD-10-CM | POA: Diagnosis not present

## 2017-03-23 DIAGNOSIS — J449 Chronic obstructive pulmonary disease, unspecified: Secondary | ICD-10-CM | POA: Diagnosis present

## 2017-03-23 DIAGNOSIS — Z79899 Other long term (current) drug therapy: Secondary | ICD-10-CM

## 2017-03-23 DIAGNOSIS — Y838 Other surgical procedures as the cause of abnormal reaction of the patient, or of later complication, without mention of misadventure at the time of the procedure: Secondary | ICD-10-CM | POA: Diagnosis present

## 2017-03-23 DIAGNOSIS — T82855A Stenosis of coronary artery stent, initial encounter: Secondary | ICD-10-CM | POA: Diagnosis present

## 2017-03-23 DIAGNOSIS — F17200 Nicotine dependence, unspecified, uncomplicated: Secondary | ICD-10-CM | POA: Diagnosis not present

## 2017-03-23 DIAGNOSIS — K219 Gastro-esophageal reflux disease without esophagitis: Secondary | ICD-10-CM | POA: Diagnosis present

## 2017-03-23 DIAGNOSIS — I350 Nonrheumatic aortic (valve) stenosis: Secondary | ICD-10-CM | POA: Diagnosis not present

## 2017-03-23 DIAGNOSIS — Z7951 Long term (current) use of inhaled steroids: Secondary | ICD-10-CM | POA: Diagnosis not present

## 2017-03-23 DIAGNOSIS — Z951 Presence of aortocoronary bypass graft: Secondary | ICD-10-CM | POA: Diagnosis not present

## 2017-03-23 DIAGNOSIS — I10 Essential (primary) hypertension: Secondary | ICD-10-CM | POA: Diagnosis present

## 2017-03-23 DIAGNOSIS — I119 Hypertensive heart disease without heart failure: Secondary | ICD-10-CM | POA: Diagnosis present

## 2017-03-23 DIAGNOSIS — I2582 Chronic total occlusion of coronary artery: Secondary | ICD-10-CM | POA: Diagnosis present

## 2017-03-23 HISTORY — DX: Nonrheumatic aortic (valve) stenosis: I35.0

## 2017-03-23 LAB — CBC
HEMATOCRIT: 38.9 % — AB (ref 39.0–52.0)
HEMOGLOBIN: 13.7 g/dL (ref 13.0–17.0)
MCH: 31.8 pg (ref 26.0–34.0)
MCHC: 35.2 g/dL (ref 30.0–36.0)
MCV: 90.3 fL (ref 78.0–100.0)
Platelets: 221 10*3/uL (ref 150–400)
RBC: 4.31 MIL/uL (ref 4.22–5.81)
RDW: 13.2 % (ref 11.5–15.5)
WBC: 9.3 10*3/uL (ref 4.0–10.5)

## 2017-03-23 LAB — PROTIME-INR
INR: 1.01
Prothrombin Time: 13.3 seconds (ref 11.4–15.2)

## 2017-03-23 LAB — BASIC METABOLIC PANEL
ANION GAP: 10 (ref 5–15)
BUN: 22 mg/dL — ABNORMAL HIGH (ref 6–20)
CHLORIDE: 104 mmol/L (ref 101–111)
CO2: 22 mmol/L (ref 22–32)
Calcium: 8.9 mg/dL (ref 8.9–10.3)
Creatinine, Ser: 0.89 mg/dL (ref 0.61–1.24)
GFR calc Af Amer: >60 mL/min (ref >60)
GLUCOSE: 87 mg/dL (ref 65–99)
POTASSIUM: 3.5 mmol/L (ref 3.5–5.1)
Sodium: 136 mmol/L (ref 135–145)

## 2017-03-23 LAB — I-STAT TROPONIN, ED: Troponin i, poc: 0 ng/mL (ref 0.00–0.08)

## 2017-03-23 LAB — APTT: aPTT: 32 seconds (ref 24–36)

## 2017-03-23 MED ORDER — HEPARIN BOLUS VIA INFUSION
4000.0000 [IU] | Freq: Once | INTRAVENOUS | Status: AC
  Administered 2017-03-23: 4000 [IU] via INTRAVENOUS

## 2017-03-23 MED ORDER — HEPARIN (PORCINE) IN NACL 100-0.45 UNIT/ML-% IJ SOLN
1450.0000 [IU]/h | INTRAMUSCULAR | Status: DC
  Administered 2017-03-23: 1100 [IU]/h via INTRAVENOUS
  Administered 2017-03-24: 1450 [IU]/h via INTRAVENOUS
  Filled 2017-03-23 (×2): qty 250

## 2017-03-23 NOTE — ED Triage Notes (Signed)
Pt brought in by [facility] for c/o chest pain; pt reports he had some chest pain on the way home from work today, around 2000, pt states he then laid down but continued to have chest pain; pt reports pain at 8/10 with ems, ems administered 324mg  aspirin and 2 nitroglycerin tablets and pt states his pain is now at a 1/10; pt states the pain was to the left side of chest with some radiation to shoulder and left arm

## 2017-03-23 NOTE — Progress Notes (Signed)
ANTICOAGULATION CONSULT NOTE - Preliminary  Pharmacy Consult for heparin Indication: chest pain/ACS  No Known Allergies  Patient Measurements: Height: 5\' 8"  (172.7 cm) Weight: 195 lb (88.5 kg) IBW/kg (Calculated) : 68.4 HEPARIN DW (KG): 86.4   Vital Signs: Temp: 97.4 F (36.3 C) (07/20 2238) Temp Source: Oral (07/20 2238) BP: 123/79 (07/20 2238) Pulse Rate: 75 (07/20 2238)  Labs:  Recent Labs  03/23/17 2142  HGB 13.7  HCT 38.9*  PLT 221  CREATININE 0.89   Estimated Creatinine Clearance: 90.6 mL/min (by C-G formula based on SCr of 0.89 mg/dL).  Medical History: Past Medical History:  Diagnosis Date  . Arthritis   . CAD (coronary artery disease)   . GERD (gastroesophageal reflux disease)   . HLD (hyperlipidemia)   . HTN (hypertension)   . Hx of CABG   . Tobacco abuse     Medications:   (Not in a hospital admission) Scheduled:  . heparin  4,000 Units Intravenous Once   Infusions:  . heparin     PRN:  Anti-infectives    None      Assessment: 66 yo male with hx CABG in 2008 brought in by EMS for chest pain. Received 324mg  ASA and NTG and pain was relieved.  Starting heparin gtt.  [facility]. PTT,PT/INR pending, Plt ok.   Goal of Therapy:  Heparin level 0.3-0.7 units/ml   Plan:  Give 4000 units bolus x 1 Start heparin infusion at 1100 units/hr Check anti-Xa level in 6 hours and daily while on heparin Continue to monitor H&H and platelets Preliminary review of pertinent patient information completed.  Jeani HawkingAnnie Penn clinical pharmacist will complete review during morning rounds to assess the patient and finalize treatment regimen.  Armstrong Creasy, Berneice Heinrichiffany Scarlett, RPH 03/23/2017,11:10 PM

## 2017-03-23 NOTE — H&P (Signed)
History and Physical    Craig Harmon DOB: Jun 22, 1951 DOA: 03/23/2017  PCP: Pearson Grippe, MD  Patient coming from: Home.    Chief Complaint: Retrosternal chest pain with nausea and diaphoresis.   HPI: Craig Harmon is an 66 y.o. male with hx of HTN, HLD, known CAD s/p CABG 8 years ago, with no chest pain post bypass, presented to the ER with acute onset of retrosternal chest pain similar to his prior CP before bypass, associated with diaphoresis and nausea, but no SOB while driving home tonight.  He took his NGT which was old, and it made no improvement.  He summoned EMS and was given 1 SL NTG which improved his discomfort, and in the ER he was given another SL which resolved his pain.  He was given ASA by EMS.  Evaluation in the ER included a normal EKG and negative initial troponin.  Hospitalist was asked to admit him for cardiac r/out.  He does smoke 1 ppd.   ED Course:  See above.  Rewiew of Systems:  Constitutional: Negative for malaise, fever and chills. No significant weight loss or weight gain Eyes: Negative for eye pain, redness and discharge, diplopia, visual changes, or flashes of light. ENMT: Negative for ear pain, hoarseness, nasal congestion, sinus pressure and sore throat. No headaches; tinnitus, drooling, or problem swallowing. Cardiovascular: Negative for palpitations, diaphoresis, dyspnea and peripheral edema. ; No orthopnea, PND Respiratory: Negative for cough, hemoptysis, wheezing and stridor. No pleuritic chestpain. Gastrointestinal: Negative for diarrhea, constipation,  melena, blood in stool, hematemesis, jaundice and rectal bleeding.    Genitourinary: Negative for frequency, dysuria, incontinence,flank pain and hematuria; Musculoskeletal: Negative for back pain and neck pain. Negative for swelling and trauma.;  Skin: . Negative for pruritus, rash, abrasions, bruising and skin lesion.; ulcerations Neuro: Negative for headache, lightheadedness and neck  stiffness. Negative for weakness, altered level of consciousness , altered mental status, extremity weakness, burning feet, involuntary movement, seizure and syncope.  Psych: negative for anxiety, depression, insomnia, tearfulness, panic attacks, hallucinations, paranoia, suicidal or homicidal ideation   Past Medical History:  Diagnosis Date  . Arthritis   . CAD (coronary artery disease)   . GERD (gastroesophageal reflux disease)   . HLD (hyperlipidemia)   . HTN (hypertension)   . Hx of CABG   . Tobacco abuse     Past Surgical History:  Procedure Laterality Date  . BACK SURGERY    . CORONARY ARTERY BYPASS GRAFT  2008     reports that he has been smoking Cigarettes.  He has a 45.00 pack-year smoking history. He has never used smokeless tobacco. He reports that he drinks alcohol. He reports that he does not use drugs.  No Known Allergies  Family History  Problem Relation Age of Onset  . Heart attack Mother   . Alcohol abuse Father   . CAD Brother      Prior to Admission medications   Medication Sig Start Date End Date Taking? Authorizing Provider  albuterol (PROVENTIL HFA;VENTOLIN HFA) 108 (90 BASE) MCG/ACT inhaler Inhale 2 puffs into the lungs every 6 (six) hours as needed for wheezing or shortness of breath. 10/09/13  Yes Kela Millin, MD  Cholecalciferol (VITAMIN D3) 1000 units CAPS Take 1 capsule by mouth daily.    Yes [provider]  esomeprazole (NEXIUM) 20 MG capsule Take 20 mg by mouth daily at 12 noon.   Yes [provider]  fluticasone (FLONASE) 50 MCG/ACT nasal spray Place 1 spray into  both nostrils daily.   Yes [provider]  lisinopril-hydrochlorothiazide (PRINZIDE,ZESTORETIC) 20-25 MG per tablet Take 1 tablet by mouth daily. 07/09/13  Yes Kela MillinBarrino, Alethea Y, MD  metoprolol succinate (TOPROL-XL) 50 MG 24 hr tablet Take 1 tablet (50 mg total) by mouth daily. Patient taking differently: Take 50 mg by mouth at bedtime.  07/09/13  Yes  Barrino, Magdalen SpatzAlethea Y, MD  umeclidinium bromide (INCRUSE ELLIPTA) 62.5 MCG/INH AEPB Inhale 1 puff into the lungs daily.  08/12/15  Yes [provider]    Physical Exam: Vitals:   03/23/17 2138 03/23/17 2230 03/23/17 2238 03/23/17 2345  BP:  123/79 123/79 129/79  Pulse:  73 75 89  Resp:   16 18  Temp:   (!) 97.4 F (36.3 C)   TempSrc:   Oral   SpO2:  98% 97% 98%  Weight: 88.5 kg (195 lb)     Height: 5\' 8"  (1.727 m)      Constitutional: NAD, calm, comfortable Vitals:   03/23/17 2138 03/23/17 2230 03/23/17 2238 03/23/17 2345  BP:  123/79 123/79 129/79  Pulse:  73 75 89  Resp:   16 18  Temp:   (!) 97.4 F (36.3 C)   TempSrc:   Oral   SpO2:  98% 97% 98%  Weight: 88.5 kg (195 lb)     Height: 5\' 8"  (1.727 m)      Eyes: PERRL, lids and conjunctivae normal ENMT: Mucous membranes are moist. Posterior pharynx clear of any exudate or lesions.Normal dentition.  Neck: normal, supple, no masses, no thyromegaly Respiratory: clear to auscultation bilaterally, no wheezing, no crackles. Normal respiratory effort. No accessory muscle use.  Cardiovascular: Regular rate and rhythm, Cre decrs murmur mid to late in the cycle.  Audible S2.  / rubs / gallops. No extremity edema. 2+ pedal pulses. No carotid bruits.  Abdomen: no tenderness, no masses palpated. No hepatosplenomegaly. Bowel sounds positive.  Musculoskeletal: no clubbing / cyanosis. No joint deformity upper and lower extremities. Good ROM, no contractures. Normal muscle tone.  Skin: no rashes, lesions, ulcers. No induration Neurologic: CN 2-12 grossly intact. Sensation intact, DTR normal. Strength 5/5 in all 4.  Psychiatric: Normal judgment and insight. Alert and oriented x 3. Normal mood.     Labs on Admission: I have personally reviewed following labs and imaging studies  CBC:  Recent Labs Lab 03/23/17 2142  WBC 9.3  HGB 13.7  HCT 38.9*  MCV 90.3  PLT 221   Basic Metabolic Panel:  Recent Labs Lab 03/23/17 2142    NA 136  K 3.5  CL 104  CO2 22  GLUCOSE 87  BUN 22*  CREATININE 0.89  CALCIUM 8.9   GFR: Coagulation Profile:  Recent Labs Lab 03/23/17 2142  INR 1.01   Radiological Exams on Admission: Dg Chest 2 View  Result Date: 03/23/2017 CLINICAL DATA:  Left-sided chest pain radiating to the shoulder and arm since 2000 hours. EXAM: CHEST  2 VIEW COMPARISON:  01/08/2014 FINDINGS: The heart size and mediastinal contours are within normal limits. Moderate-sized hiatal hernia is superimposed on the normal sized heart. The patient is status post median sternotomy. Both lungs are clear. Chronic stable mild degenerative change along the dorsal spine. IMPRESSION: No active cardiopulmonary disease.  Moderate-sized hiatal hernia. Electronically Signed   By: Tollie Ethavid  Kwon M.D.   On: 03/23/2017 22:31    EKG: Independently reviewed.  Assessment/Plan Principal Problem:   Unstable angina (HCC) Active Problems:   HTN (hypertension)   COPD (chronic obstructive pulmonary  disease) (HCC)   HLD (hyperlipidemia)   Tobacco use disorder    PLAN:   Chest pain in patient with known CAD s/p CABG about 8 years out, with some nausea and diaphoresis, negative EKG and negative initial troponin.  I am concern about [place].  Will start IV Heparin, continue with ASA, add statin, continue with BB, and will transfer to [facility] for cardiology consultation.   EDP spoke with cardiology fellow tonight.  He is pain free at this time.    Heart murmur:  His murmur is consistent with at least moderate aortic stenosis.  Will obtain ECHO.    Tobacco abuse:  Advise stop.   HLD:  Will start Lipitor.   DVT prophylaxis: IV Heparin. Code Status: FULL CODE.  Family Communication:n None at bedside.  Disposition Plan: Home.  Consults called: Cardiology by EDP.  Admission status: inpatient.    Kenniya Westrich MD FACP. [facility]  If 7PM-7AM, please contact night-coverage [email or web address] Password [email or web address]  03/23/2017, 11:46 PM

## 2017-03-23 NOTE — ED Provider Notes (Signed)
[facility]-EMERGENCY DEPT Provider Note   CSN: [id number] Arrival date & time: 03/23/17  2134     History   Chief Complaint Chief Complaint  Patient presents with  . Chest Pain    HPI Craig Harmon is a 66 y.o. male.  HPI Patient presents with chest pain. Was driving home from work today when he had acute sudden crushing chest pain that then went to pressure. It was on his mid chest. States he was able to drive all the way home with some help from praying. EMS came and gave him aspirin and nitroglycerin. Pain then decreased. Was in the chest and went on his left arm. Felt like his previous anginal pain. Previous coronary artery disease and previous CABG. Continues to smoke. Does not have a cardiologist here. No nausea or diaphoresis. He was coming home from his job today and states he was able to work all shift without any problems.   Past Medical History:  Diagnosis Date  . Arthritis   . CAD (coronary artery disease)   . GERD (gastroesophageal reflux disease)   . HLD (hyperlipidemia)   . HTN (hypertension)   . Hx of CABG   . Tobacco abuse     Patient Active Problem List   Diagnosis Date Noted  . Fall at home 12/24/2013  . Left hip pain 12/24/2013  . Back pain, lumbosacral 12/24/2013  . Loss of weight 12/24/2013  . Right ear pain 12/24/2013  . Lump in neck 12/24/2013  . Tobacco abuse 12/24/2013  . Hearing loss in right ear 12/10/2013  . Ear drainage right 12/10/2013  . Chronic left hip pain 12/10/2013  . Otitis externa of right ear 12/10/2013  . Submandibular sialoadenitis 10/09/2013  . Mass of right submandibular region 10/09/2013  . COPD bronchitis 10/09/2013  . Difficulty swallowing 10/09/2013  . Unintentional weight loss of more than 10 pounds in 90 days 10/09/2013  . 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 HTN (hypertension) 06/24/2013  . COPD (chronic obstructive pulmonary disease) (HCC) 06/24/2013  . CAD (coronary artery disease), bypass graft transplanted  heart 06/24/2013  . HLD (hyperlipidemia) 06/24/2013  . Tobacco use disorder 06/24/2013    Past Surgical History:  Procedure Laterality Date  . BACK SURGERY    . CORONARY ARTERY BYPASS GRAFT  2008       Home Medications    Prior to Admission medications   Medication Sig Start Date End Date Taking? Authorizing Provider  albuterol (PROVENTIL HFA;VENTOLIN HFA) 108 (90 BASE) MCG/ACT inhaler Inhale 2 puffs into the lungs every 6 (six) hours as needed for wheezing or shortness of breath. 10/09/13  Yes Kela MillinBarrino, Alethea Y, MD  Cholecalciferol (VITAMIN D3) 1000 units CAPS Take 1 capsule by mouth daily.    Yes [provider]  esomeprazole (NEXIUM) 20 MG capsule Take 20 mg by mouth daily at 12 noon.   Yes [provider]  fluticasone (FLONASE) 50 MCG/ACT nasal spray Place 1 spray into both nostrils daily.   Yes [provider]  lisinopril-hydrochlorothiazide (PRINZIDE,ZESTORETIC) 20-25 MG per tablet Take 1 tablet by mouth daily. 07/09/13  Yes Kela MillinBarrino, Alethea Y, MD  metoprolol succinate (TOPROL-XL) 50 MG 24 hr tablet Take 1 tablet (50 mg total) by mouth daily. Patient taking differently: Take 50 mg by mouth at bedtime.  07/09/13  Yes Barrino, Magdalen SpatzAlethea Y, MD  umeclidinium bromide (INCRUSE ELLIPTA) 62.5 MCG/INH AEPB Inhale 1 puff into the lungs daily.  08/12/15  Yes [provider]    Family History Family History  Problem Relation Age of Onset  . Heart attack Mother   . Alcohol abuse Father   . CAD Brother     Social History Social History  Substance Use Topics  . Smoking status: Current Every Day Smoker    Packs/day: 1.00    Years: 45.00    Types: Cigarettes  . Smokeless tobacco: Never Used  . Alcohol use Yes     Comment: occasionally     Allergies   Patient has no known allergies.   Review of Systems Review of Systems  Constitutional: Negative for appetite change and diaphoresis.  Cardiovascular: Positive for chest pain.  Gastrointestinal:  Negative for abdominal pain and nausea.  Genitourinary: Negative for flank pain.  Musculoskeletal: Negative for back pain.  Neurological: Negative for numbness.  Hematological: Negative for adenopathy.  Psychiatric/Behavioral: Negative for confusion.     Physical Exam Updated Vital Signs BP 123/79   Pulse 75   Temp (!) 97.4 F (36.3 C) (Oral)   Resp 16   Ht 5\' 8"  (1.727 m)   Wt 88.5 kg (195 lb)   SpO2 97%   BMI 29.65 kg/m   Physical Exam  Constitutional: He appears well-developed.  HENT:  Head: Atraumatic.  Eyes: EOM are normal.  Neck: Neck supple.  Cardiovascular: Normal rate.   Pulmonary/Chest: Effort normal.  Abdominal: Soft.  Musculoskeletal: He exhibits no edema.  Neurological: He is alert.  Skin: Skin is warm. Capillary refill takes less than 2 seconds.     ED Treatments / Results  Labs (all labs ordered are listed, but only abnormal results are displayed) Labs Reviewed  BASIC METABOLIC PANEL - Abnormal; Notable for the following:       Result Value   BUN 22 (*)    All other components within normal limits  CBC - Abnormal; Notable for the following:    HCT 38.9 (*)    All other components within normal limits  I-STAT TROPONIN, ED    EKG  EKG Interpretation  Date/Time:  Friday March 23 2017 21:44:39 EDT Ventricular Rate:  83 PR Interval:    QRS Duration: 107 QT Interval:  397 QTC Calculation: 467 R Axis:   31 Text Interpretation:  Sinus rhythm Confirmed by Benjiman Core [phone]) on 03/23/2017 9:46:04 PM       Radiology Dg Chest 2 View  Result Date: 03/23/2017 CLINICAL DATA:  Left-sided chest pain radiating to the shoulder and arm since 2000 hours. EXAM: CHEST  2 VIEW COMPARISON:  01/08/2014 FINDINGS: The heart size and mediastinal contours are within normal limits. Moderate-sized hiatal hernia is superimposed on the normal sized heart. The patient is status post median sternotomy. Both lungs are clear. Chronic stable mild degenerative change  along the dorsal spine. IMPRESSION: No active cardiopulmonary disease.  Moderate-sized hiatal hernia. Electronically Signed   By: Tollie Eth M.D.   On: 03/23/2017 22:31    Procedures Procedures (including critical care time)  Medications Ordered in ED Medications - No data to display   Initial Impression / Assessment and Plan / ED Course  I have reviewed the triage vital signs and the nursing notes.  Pertinent labs & imaging results that were available during my care of the patient were reviewed by me and considered in my medical decision making (see chart for details).     Patient with chest pain. EKG reassuring and pain-free now however is a worrisome story for acute unstable angina. Pain was like his previous anginal pain. Relieved by nitroglycerin. Seen by Dr. Nedra Hai  for potential admission but he would [facility]. Will discuss with cardiology fellow  CRITICAL CARE Performed by: Billee Cashing Total critical care time: 30 minutes Critical care time was exclusive of separately billable procedures and treating other patients. Critical care was necessary to treat or prevent imminent or life-threatening deterioration. Critical care was time spent personally by me on the following activities: development of treatment plan with patient and/or surrogate as well as nursing, discussions with consultants, evaluation of patient's response to treatment, examination of patient, obtaining history from patient or surrogate, ordering and performing treatments and interventions, ordering and review of laboratory studies, ordering and review of radiographic studies, pulse oximetry and re-evaluation of patient's condition.   Final Clinical Impressions(s) / ED Diagnoses   Final diagnoses:  Unstable angina [facility])    New Prescriptions New Prescriptions   No medications on file     Benjiman Core, MD 03/23/17 2311

## 2017-03-24 ENCOUNTER — Inpatient Hospital Stay (HOSPITAL_COMMUNITY): Payer: Medicare Other

## 2017-03-24 DIAGNOSIS — E782 Mixed hyperlipidemia: Secondary | ICD-10-CM

## 2017-03-24 DIAGNOSIS — I10 Essential (primary) hypertension: Secondary | ICD-10-CM

## 2017-03-24 DIAGNOSIS — E440 Moderate protein-calorie malnutrition: Secondary | ICD-10-CM | POA: Insufficient documentation

## 2017-03-24 DIAGNOSIS — I200 Unstable angina: Secondary | ICD-10-CM

## 2017-03-24 DIAGNOSIS — I361 Nonrheumatic tricuspid (valve) insufficiency: Secondary | ICD-10-CM

## 2017-03-24 LAB — ECHOCARDIOGRAM COMPLETE
Height: 68 in
Weight: 3081.6 oz

## 2017-03-24 LAB — TROPONIN I
Troponin I: <0.03 ng/mL (ref <0.03)
Troponin I: <0.03 ng/mL (ref <0.03)

## 2017-03-24 LAB — CBC
HCT: 38.6 % — ABNORMAL LOW (ref 39.0–52.0)
Hemoglobin: 13 g/dL (ref 13.0–17.0)
MCH: 31 pg (ref 26.0–34.0)
MCHC: 33.7 g/dL (ref 30.0–36.0)
MCV: 92.1 fL (ref 78.0–100.0)
PLATELETS: 214 10*3/uL (ref 150–400)
RBC: 4.19 MIL/uL — ABNORMAL LOW (ref 4.22–5.81)
RDW: 13.6 % (ref 11.5–15.5)
WBC: 7.1 10*3/uL (ref 4.0–10.5)

## 2017-03-24 LAB — HEPARIN LEVEL (UNFRACTIONATED)
HEPARIN UNFRACTIONATED: 0.35 [IU]/mL (ref 0.30–0.70)
Heparin Unfractionated: 0.25 IU/mL — ABNORMAL LOW (ref 0.30–0.70)

## 2017-03-24 MED ORDER — LISINOPRIL 20 MG PO TABS
20.0000 mg | ORAL_TABLET | Freq: Every day | ORAL | Status: DC
  Administered 2017-03-24 – 2017-03-25 (×2): 20 mg via ORAL
  Filled 2017-03-24 (×2): qty 1

## 2017-03-24 MED ORDER — ASPIRIN EC 81 MG PO TBEC
81.0000 mg | DELAYED_RELEASE_TABLET | Freq: Every day | ORAL | Status: DC
  Administered 2017-03-24 – 2017-03-27 (×4): 81 mg via ORAL
  Filled 2017-03-24 (×4): qty 1

## 2017-03-24 MED ORDER — MORPHINE SULFATE (PF) 2 MG/ML IV SOLN
2.0000 mg | INTRAVENOUS | Status: DC | PRN
  Filled 2017-03-24: qty 1

## 2017-03-24 MED ORDER — ALBUTEROL SULFATE (2.5 MG/3ML) 0.083% IN NEBU: 3.0000 mL | INHALATION_SOLUTION | Freq: Four times a day (QID) | RESPIRATORY_TRACT | Status: DC | PRN

## 2017-03-24 MED ORDER — PANTOPRAZOLE SODIUM 40 MG PO TBEC
40.0000 mg | DELAYED_RELEASE_TABLET | Freq: Every day | ORAL | Status: DC
  Administered 2017-03-24 – 2017-03-27 (×4): 40 mg via ORAL
  Filled 2017-03-24 (×4): qty 1

## 2017-03-24 MED ORDER — SODIUM CHLORIDE 0.9% FLUSH
3.0000 mL | Freq: Two times a day (BID) | INTRAVENOUS | Status: DC
  Administered 2017-03-24 – 2017-03-27 (×4): 3 mL via INTRAVENOUS

## 2017-03-24 MED ORDER — ONDANSETRON HCL 4 MG/2ML IJ SOLN: 4.0000 mg | Freq: Four times a day (QID) | INTRAMUSCULAR | Status: DC | PRN

## 2017-03-24 MED ORDER — ONDANSETRON HCL 4 MG PO TABS: 4.0000 mg | ORAL_TABLET | Freq: Four times a day (QID) | ORAL | Status: DC | PRN

## 2017-03-24 MED ORDER — NICOTINE 21 MG/24HR TD PT24
21.0000 mg | MEDICATED_PATCH | Freq: Every day | TRANSDERMAL | Status: DC
  Administered 2017-03-24 – 2017-03-27 (×4): 21 mg via TRANSDERMAL
  Filled 2017-03-24 (×4): qty 1

## 2017-03-24 MED ORDER — ACETAMINOPHEN 325 MG PO TABS
650.0000 mg | ORAL_TABLET | Freq: Four times a day (QID) | ORAL | Status: DC | PRN
  Administered 2017-03-24 – 2017-03-26 (×3): 650 mg via ORAL
  Filled 2017-03-24 (×3): qty 2

## 2017-03-24 MED ORDER — HEPARIN BOLUS VIA INFUSION
2500.0000 [IU] | Freq: Once | INTRAVENOUS | Status: AC
  Administered 2017-03-24: 2500 [IU] via INTRAVENOUS
  Filled 2017-03-24: qty 2500

## 2017-03-24 MED ORDER — METOPROLOL SUCCINATE ER 50 MG PO TB24
50.0000 mg | ORAL_TABLET | Freq: Every day | ORAL | Status: DC
  Administered 2017-03-24 – 2017-03-25 (×3): 50 mg via ORAL
  Filled 2017-03-24 (×4): qty 1

## 2017-03-24 MED ORDER — HYDROCHLOROTHIAZIDE 25 MG PO TABS
25.0000 mg | ORAL_TABLET | Freq: Every day | ORAL | Status: DC
  Administered 2017-03-24 – 2017-03-26 (×3): 25 mg via ORAL
  Filled 2017-03-24 (×3): qty 1

## 2017-03-24 MED ORDER — HEPARIN (PORCINE) IN NACL 100-0.45 UNIT/ML-% IJ SOLN
1500.0000 [IU]/h | INTRAMUSCULAR | Status: DC
  Administered 2017-03-25 – 2017-03-26 (×2): 1500 [IU]/h via INTRAVENOUS
  Filled 2017-03-24 (×2): qty 250

## 2017-03-24 MED ORDER — DEXTROSE-NACL 5-0.9 % IV SOLN
INTRAVENOUS | Status: DC
  Administered 2017-03-24: 100 mL/h via INTRAVENOUS
  Administered 2017-03-24: 1000 mL via INTRAVENOUS
  Administered 2017-03-24: 03:00:00 via INTRAVENOUS

## 2017-03-24 MED ORDER — UMECLIDINIUM BROMIDE 62.5 MCG/INH IN AEPB
1.0000 | INHALATION_SPRAY | Freq: Every day | RESPIRATORY_TRACT | Status: DC
  Administered 2017-03-24 – 2017-03-27 (×4): 1 via RESPIRATORY_TRACT
  Filled 2017-03-24 (×3): qty 7

## 2017-03-24 MED ORDER — ATORVASTATIN CALCIUM 40 MG PO TABS
40.0000 mg | ORAL_TABLET | Freq: Every day | ORAL | Status: DC
  Administered 2017-03-24 – 2017-03-26 (×4): 40 mg via ORAL
  Filled 2017-03-24 (×4): qty 1

## 2017-03-24 MED ORDER — LISINOPRIL-HYDROCHLOROTHIAZIDE 20-25 MG PO TABS: 1.0000 | ORAL_TABLET | Freq: Every day | ORAL | Status: DC

## 2017-03-24 MED ORDER — ACETAMINOPHEN 650 MG RE SUPP: 650.0000 mg | Freq: Four times a day (QID) | RECTAL | Status: DC | PRN

## 2017-03-24 NOTE — Progress Notes (Signed)
ANTICOAGULATION CONSULT NOTE - Follow Up Consult   Pharmacy Consult for heparin Indication: chest pain/ACS  No Known Allergies  Patient Measurements: Height: 5\' 8"  (172.7 cm) Weight: 192 lb 9.6 oz (87.4 kg) (Scale A) IBW/kg (Calculated) : 68.4 Heparin Dosing Weight: 86.1 Kg  Vital Signs: Temp: 97.9 F (36.6 C) (07/21 2118) Temp Source: Oral (07/21 2118) BP: 124/74 (07/21 2118) Pulse Rate: 67 (07/21 2118)  Labs:  Recent Labs  03/23/17 2142 03/24/17 0210 03/24/17 0423 03/24/17 0718 03/24/17 1257 03/24/17 2059  HGB 13.7  --   --   --  13.0  --   HCT 38.9*  --   --   --  38.6*  --   PLT 221  --   --   --  214  --   APTT 32  --   --   --   --   --   LABPROT 13.3  --   --   --   --   --   INR 1.01  --   --   --   --   --   HEPARINUNFRC  --   --  0.25*  --  <0.10* 0.35  CREATININE 0.89  --   --   --   --   --   TROPONINI  --  <0.03  --  <0.03 <0.03  --     Estimated Creatinine Clearance: 90.1 mL/min (by C-G formula based on SCr of 0.89 mg/dL).   Medications:  Prescriptions Prior to Admission  Medication Sig Dispense Refill Last Dose  . albuterol (PROVENTIL HFA;VENTOLIN HFA) 108 (90 BASE) MCG/ACT inhaler Inhale 2 puffs into the lungs every 6 (six) hours as needed for wheezing or shortness of breath. 1 Inhaler 5 03/23/2017 at Unknown time  . Cholecalciferol (VITAMIN D3) 1000 units CAPS Take 1 capsule by mouth daily.    03/23/2017 at Unknown time  . esomeprazole (NEXIUM) 20 MG capsule Take 20 mg by mouth daily at 12 noon.   03/23/2017 at Unknown time  . fluticasone (FLONASE) 50 MCG/ACT nasal spray Place 1 spray into both nostrils daily.   03/23/2017 at Unknown time  . lisinopril-hydrochlorothiazide (PRINZIDE,ZESTORETIC) 20-25 MG per tablet Take 1 tablet by mouth daily. 30 tablet 2 03/23/2017 at Unknown time  . metoprolol succinate (TOPROL-XL) 50 MG 24 hr tablet Take 1 tablet (50 mg total) by mouth daily. (Patient taking differently: Take 50 mg by mouth at bedtime. ) 30 tablet 2  03/22/2017 at 2100  . umeclidinium bromide (INCRUSE ELLIPTA) 62.5 MCG/INH AEPB Inhale 1 puff into the lungs daily.    unknown at Unknown time    Assessment: 66 [facility] patient with know CAD s/p CABG ~8 years ago. No anticoagulation prior to admission. CBC WNL. No bleeding or infusion issues documented.   Heparin level = 0.35, therapeutic after heparin rate increased to 1450 units/hr and 6hr after receiving 2500 unit bolus.  Level is at low end of therapeutic range thus will increase heparin rate slightly to try to avoid level dropping below goal.  CBC WNL. No bleeding reported per RN.    Goal of Therapy:  Heparin level 0.3-0.7 units/ml Monitor platelets by anticoagulation protocol: Yes   Plan:  Increase IV heparin infusion to 1500 units/hr Check anti-Xa level in ~ 6 hours with 5am daily labs.  Daily HL and CBC while on heparin Continue to monitor H&H and platelets  Noah Delaineuth Ellenie Salome, RPh Clinical Pharmacist 03/24/2017 9:54 PM

## 2017-03-24 NOTE — Progress Notes (Signed)
*  PRELIMINARY RESULTS* Echocardiogram 2D Echocardiogram has been performed.  Jeryl Columbialliott, Chang Tiggs 03/24/2017, 2:41 PM

## 2017-03-24 NOTE — Progress Notes (Signed)
ANTICOAGULATION CONSULT NOTE - Follow Up Consult   Pharmacy Consult for heparin Indication: chest pain/ACS  No Known Allergies  Patient Measurements: Height: 5\' 8"  (172.7 cm) Weight: 192 lb 9.6 oz (87.4 kg) (Scale A) IBW/kg (Calculated) : 68.4 Heparin Dosing Weight: 86.1 Kg  Vital Signs: Temp: 98 F (36.7 C) (07/21 0514) Temp Source: Oral (07/21 0514) BP: 103/73 (07/21 0514) Pulse Rate: 60 (07/21 0514)  Labs:  Recent Labs  03/23/17 2142 03/24/17 0210 03/24/17 0423  HGB 13.7  --   --   HCT 38.9*  --   --   PLT 221  --   --   APTT 32  --   --   LABPROT 13.3  --   --   INR 1.01  --   --   HEPARINUNFRC  --   --  0.25*  CREATININE 0.89  --   --   TROPONINI  --  <0.03  --     Estimated Creatinine Clearance: 90.1 mL/min (by C-G formula based on SCr of 0.89 mg/dL).   Medications:  Prescriptions Prior to Admission  Medication Sig Dispense Refill Last Dose  . albuterol (PROVENTIL HFA;VENTOLIN HFA) 108 (90 BASE) MCG/ACT inhaler Inhale 2 puffs into the lungs every 6 (six) hours as needed for wheezing or shortness of breath. 1 Inhaler 5 03/23/2017 at Unknown time  . Cholecalciferol (VITAMIN D3) 1000 units CAPS Take 1 capsule by mouth daily.    03/23/2017 at Unknown time  . esomeprazole (NEXIUM) 20 MG capsule Take 20 mg by mouth daily at 12 noon.   03/23/2017 at Unknown time  . fluticasone (FLONASE) 50 MCG/ACT nasal spray Place 1 spray into both nostrils daily.   03/23/2017 at Unknown time  . lisinopril-hydrochlorothiazide (PRINZIDE,ZESTORETIC) 20-25 MG per tablet Take 1 tablet by mouth daily. 30 tablet 2 03/23/2017 at Unknown time  . metoprolol succinate (TOPROL-XL) 50 MG 24 hr tablet Take 1 tablet (50 mg total) by mouth daily. (Patient taking differently: Take 50 mg by mouth at bedtime. ) 30 tablet 2 03/22/2017 at 2100  . umeclidinium bromide (INCRUSE ELLIPTA) 62.5 MCG/INH AEPB Inhale 1 puff into the lungs daily.    unknown at Unknown time    Assessment: 66 [facility] patient with know CAD s/p CABG ~8 years ago. No anticoagulation prior to admission. Baseline CBC WNL. No bleeding or infusion issues documented.  Heparin level subtherapeutic: 0.25  Goal of Therapy:  Heparin level 0.3-0.7 units/ml Monitor platelets by anticoagulation protocol: Yes   Plan:  Increase heparin infusion to 1250 units/hr Check anti-Xa level in 6 hours and daily while on heparin Continue to monitor H&H and platelets  Dusten Ellinwood L Paxton Kanaan 03/24/2017,5:58 AM

## 2017-03-24 NOTE — Progress Notes (Signed)
ANTICOAGULATION CONSULT NOTE - Follow Up Consult   Pharmacy Consult for heparin Indication: chest pain/ACS  No Known Allergies  Patient Measurements: Height: 5\' 8"  (172.7 cm) Weight: 192 lb 9.6 oz (87.4 kg) (Scale A) IBW/kg (Calculated) : 68.4 Heparin Dosing Weight: 86.1 Kg  Vital Signs: Temp: 98 F (36.7 C) (07/21 0514) Temp Source: Oral (07/21 0514) BP: 103/73 (07/21 0514) Pulse Rate: 60 (07/21 0514)  Labs:  Recent Labs  03/23/17 2142 03/24/17 0210 03/24/17 0423 03/24/17 0718 03/24/17 1257  HGB 13.7  --   --   --  13.0  HCT 38.9*  --   --   --  38.6*  PLT 221  --   --   --  214  APTT 32  --   --   --   --   LABPROT 13.3  --   --   --   --   INR 1.01  --   --   --   --   HEPARINUNFRC  --   --  0.25*  --  <0.10*  CREATININE 0.89  --   --   --   --   TROPONINI  --  <0.03  --  <0.03 <0.03    Estimated Creatinine Clearance: 90.1 mL/min (by C-G formula based on SCr of 0.89 mg/dL).   Medications:  Prescriptions Prior to Admission  Medication Sig Dispense Refill Last Dose  . albuterol (PROVENTIL HFA;VENTOLIN HFA) 108 (90 BASE) MCG/ACT inhaler Inhale 2 puffs into the lungs every 6 (six) hours as needed for wheezing or shortness of breath. 1 Inhaler 5 03/23/2017 at Unknown time  . Cholecalciferol (VITAMIN D3) 1000 units CAPS Take 1 capsule by mouth daily.    03/23/2017 at Unknown time  . esomeprazole (NEXIUM) 20 MG capsule Take 20 mg by mouth daily at 12 noon.   03/23/2017 at Unknown time  . fluticasone (FLONASE) 50 MCG/ACT nasal spray Place 1 spray into both nostrils daily.   03/23/2017 at Unknown time  . lisinopril-hydrochlorothiazide (PRINZIDE,ZESTORETIC) 20-25 MG per tablet Take 1 tablet by mouth daily. 30 tablet 2 03/23/2017 at Unknown time  . metoprolol succinate (TOPROL-XL) 50 MG 24 hr tablet Take 1 tablet (50 mg total) by mouth daily. (Patient taking differently: Take 50 mg by mouth at bedtime. ) 30 tablet 2 03/22/2017 at 2100  . umeclidinium bromide (INCRUSE ELLIPTA)  62.5 MCG/INH AEPB Inhale 1 puff into the lungs daily.    unknown at Unknown time    Assessment: 66 [facility] patient with know CAD s/p CABG ~8 years ago. No anticoagulation prior to admission. Baseline CBC WNL. No bleeding or infusion issues documented.  Heparin level subtherapeutic: <0.10  Goal of Therapy:  Heparin level 0.3-0.7 units/ml Monitor platelets by anticoagulation protocol: Yes   Plan:  Re-bolus heparin 2500 units x 1 Increase heparin infusion to 1450 units/hr Check anti-Xa level in 6 hours and daily while on heparin Continue to monitor H&H and platelets  Ladell PierBrooke Blanch Stang, PharmD Pharmacy Resident Pager: [phone] 03/24/2017 2:35 PM

## 2017-03-24 NOTE — Progress Notes (Signed)
Initial Nutrition Assessment  DOCUMENTATION CODES:   Non-severe (moderate) malnutrition in context of acute illness/injury  INTERVENTION:  - Continue to encourage PO intakes of meals and beverages. - RD will continue to monitor for needs and recommendations for home.   NUTRITION DIAGNOSIS:   Malnutrition (modera/non-severe) related to acute illness (CP with unstable angina) as evidenced by energy intake < 75% for > 7 days, percent weight loss.  GOAL:   Patient will meet greater than or equal to 90% of their needs  MONITOR:   PO intake, Weight trends, Labs  REASON FOR ASSESSMENT:   Malnutrition Screening Tool  ASSESSMENT:   66 y.o. male with hx of HTN, HLD, known CAD s/p CABG 8 years ago, with no chest pain post bypass, presented to the ER with acute onset of retrosternal chest pain similar to his prior CP before bypass, associated with diaphoresis and nausea, but no SOB while driving home tonight.  He took his NGT which was old, and it made no improvement.  He summoned EMS and was given 1 SL NTG which improved his discomfort, and in the ER he was given another SL which resolved his pain.  He was given ASA by EMS.  Evaluation in the ER included a normal EKG and negative initial troponin.  Hospitalist was asked to admit him for cardiac r/out.  He does smoke 1 ppd.   Pt seen for MST. BMI indicates overweight status. Visualized breakfast tray with 100% completion of scrambled eggs, grits, and bagel. Pt ordered lunch from [employer] shortly before RD visit. Pt reports no pain or nausea with intakes at breakfast and does not have these sensations with eating at home. He reports that he has had decreased appetite x1 year and that he has never eaten much when it is hot outside. He is unsure of any triggers for decrease in appetite. He typically does not eat meals, he snacks throughout the day when at home. He also drinks several cups of coffee and smokes 1 pack throughout the day.  Pt reports  increased appetite and desire to eat today and is not interested in oral nutrition supplements at this time. Physical assessment shows no muscle or fat wasting; mild edema to BLE. Per chart review, pt has lost 8 lbs (4% body weight) since 11/23/16 which is not significant for time frame. He has lost 3 of these lbs (1.5% body weight) since 03/20/17 which is significant for time frame. He does not have a scale at home but states that 1 year ago he wore a size 36 pant and now wears a size 34.  Pt meets criteria for malnutrition based on weight loss and <75% intakes for >/= 7 days.   Medications reviewed; 25 mg Hydrodiuril/day, 40 mg oral Protonix/day. Labs reviewed.  IVF: D5-NS @ 100 mL/hr (408 kcal from dextrose).   Diet Order:  Diet Heart Room service appropriate? Yes; Fluid consistency: Thin  Skin:  Reviewed, no issues  Last BM:  7/19 (PTA)  Height:   Ht Readings from Last 1 Encounters:  03/24/17 5\' 8"  (1.727 m)    Weight:   Wt Readings from Last 1 Encounters:  03/24/17 192 lb 9.6 oz (87.4 kg)    Ideal Body Weight:  70 kg  BMI:  Body mass index is 29.28 kg/m.  Estimated Nutritional Needs:   Kcal:  2010-2270 (23-26 kcal/kg)  Protein:  70-80 grams  Fluid:  2 L/day  EDUCATION NEEDS:   No education needs identified at this time  Jarome Matin, MS, RD, LDN, [facility] Inpatient Clinical Dietitian Pager # [phone] After hours/weekend pager # [phone]

## 2017-03-24 NOTE — Progress Notes (Signed)
PROGRESS NOTE    Craig Harmon  BMW:[id number] DOB: 04/11/1951 DOA: 03/23/2017 PCP: Pearson Grippe, MD   Brief Narrative: 66 yo male with PMH of HTN, HL, GERD, Tobacco Use, and CABG 3v 8 years ago per patient presented with exertional chest pain associated with shortness of breath. Patient initially presented to [facility] ER and transferred to [facility] for the evaluation of unstable angina. IV heparin started and cardiology consult.  Assessment & Plan:  # chest pain/unstable angina: -Reported exertional chest pain. Denied pain this morning. Continue IV heparin, aspirin, Lipitor, lisinopril, metoprolol. Continue telemetry monitoring. Cardiac as a consult appreciated, plan for cardiac cath on Monday. Continue supportive care. Resume diet today. -Troponin and EKG unremarkable.  #Aortic stenosis/aortic insufficiency: Patient has heart murmur. Pending echocardiogram.  #Hypertension: Monitor blood pressure. Continue current medication including hydrochlorothiazide, lisinopril and metoprolol.  #Dyslipidemia: Continue statin. Check lipid panel.  #Tobacco dependence: Education provided to the patient. Nicotine patch.  Principal Problem:   Unstable angina (HCC) Active Problems:   HTN (hypertension)   COPD (chronic obstructive pulmonary disease) (HCC)   HLD (hyperlipidemia)   Tobacco use disorder  DVT prophylaxis: IV heparin Code Status: Full code Family Communication: Patient's girlfriend at bedside Disposition Plan: Likely discharge home in 2-3 days, waiting for cath    Consultants:   Cardiology  Procedures: Pending echo Antimicrobials: None  Subjective: Seen and examined at bedside. Denied chest pain or shortness of breath on resting. Denied headache, nausea vomiting or abdominal pain.  Objective: Vitals:   03/24/17 0000 03/24/17 0143 03/24/17 0514 03/24/17 0736  BP: 114/74 117/71 103/73   Pulse: 88 82 60   Resp: 17 17 18    Temp:  97.6 F (36.4 C) 98 F (36.7 C)   TempSrc:   Oral Oral   SpO2: 97% 97% 100% 98%  Weight:  87.4 kg (192 lb 9.6 oz)    Height:  5\' 8"  (1.727 m)      Intake/Output Summary (Last 24 hours) at 03/24/17 0952 Last data filed at 03/24/17 0514  Gross per 24 hour  Intake                0 ml  Output              300 ml  Net             -300 ml   [facility] Weights   03/23/17 2138 03/24/17 0143  Weight: 88.5 kg (195 lb) 87.4 kg (192 lb 9.6 oz)    Examination:  General exam: Appears calm and comfortable  Respiratory system: Clear to auscultation. Respiratory effort normal. No wheezing or crackle Cardiovascular system: S1 & S2 heard, RRR, systolic murmur.  No pedal edema. Gastrointestinal system: Abdomen is nondistended, soft and nontender. Normal bowel sounds heard. Central nervous system: Alert and oriented. No focal neurological deficits. Extremities: Symmetric 5 x 5 power. Skin: No rashes, lesions or ulcers Psychiatry: Judgement and insight appear normal. Mood & affect appropriate.     Data Reviewed: I have personally reviewed following labs and imaging studies  CBC:  Recent Labs Lab 03/23/17 2142  WBC 9.3  HGB 13.7  HCT 38.9*  MCV 90.3  PLT 221   Basic Metabolic Panel:  Recent Labs Lab 03/23/17 2142  NA 136  K 3.5  CL 104  CO2 22  GLUCOSE 87  BUN 22*  CREATININE 0.89  CALCIUM 8.9   GFR: Estimated Creatinine Clearance: 90.1 mL/min (by C-G formula based on SCr of 0.89 mg/dL). Liver Function  Tests: No results for input(s): AST, ALT, ALKPHOS, BILITOT, PROT, ALBUMIN in the last 168 hours. No results for input(s): LIPASE, AMYLASE in the last 168 hours. No results for input(s): AMMONIA in the last 168 hours. Coagulation Profile:  Recent Labs Lab 03/23/17 2142  INR 1.01   Cardiac Enzymes:  Recent Labs Lab 03/24/17 0210 03/24/17 0718  TROPONINI <0.03 <0.03   BNP (last 3 results) No results for input(s): PROBNP in the last 8760 hours. HbA1C: No results for input(s): HGBA1C in the last 72  hours. CBG: No results for input(s): GLUCAP in the last 168 hours. Lipid Profile: No results for input(s): CHOL, HDL, LDLCALC, TRIG, CHOLHDL, LDLDIRECT in the last 72 hours. Thyroid Function Tests: No results for input(s): TSH, T4TOTAL, FREET4, T3FREE, THYROIDAB in the last 72 hours. Anemia Panel: No results for input(s): VITAMINB12, FOLATE, FERRITIN, TIBC, IRON, RETICCTPCT in the last 72 hours. Sepsis Labs: No results for input(s): PROCALCITON, LATICACIDVEN in the last 168 hours.  No results found for this or any previous visit (from the past 240 hour(s)).       Radiology Studies: Dg Chest 2 View  Result Date: 03/23/2017 CLINICAL DATA:  Left-sided chest pain radiating to the shoulder and arm since 2000 hours. EXAM: CHEST  2 VIEW COMPARISON:  01/08/2014 FINDINGS: The heart size and mediastinal contours are within normal limits. Moderate-sized hiatal hernia is superimposed on the normal sized heart. The patient is status post median sternotomy. Both lungs are clear. Chronic stable mild degenerative change along the dorsal spine. IMPRESSION: No active cardiopulmonary disease.  Moderate-sized hiatal hernia. Electronically Signed   By: Tollie Ethavid  Kwon M.D.   On: 03/23/2017 22:31        Scheduled Meds: . aspirin EC  81 mg Oral Daily  . atorvastatin  40 mg Oral q1800  . hydrochlorothiazide  25 mg Oral Daily  . lisinopril  20 mg Oral Daily  . metoprolol succinate  50 mg Oral QHS  . pantoprazole  40 mg Oral Daily  . sodium chloride flush  3 mL Intravenous Q12H  . umeclidinium bromide  1 puff Inhalation Daily   Continuous Infusions: . dextrose 5 % and 0.9% NaCl 100 mL/hr at 03/24/17 0300  . heparin 1,250 Units/hr (03/24/17 0706)     LOS: 1 day    Miyah Hampshire Jaynie CollinsPrasad Herberta Pickron, MD [facility] Pager [phone]  If 7PM-7AM, please contact night-coverage [email or web address] Password [email or web address] 03/24/2017, 9:52 AM

## 2017-03-24 NOTE — Progress Notes (Signed)
Discharge instructions and home medication instructions given to patient using teach back method. Patient was able to verbalize understanding and repeat back instructions. All questions answered. PIV was removed without difficulty. Patient discharged home with wife.

## 2017-03-24 NOTE — Consult Note (Signed)
Cardiology Consult    Patient ID: Craig Harmon MRN: [id number], DOB/AGE: Feb 28, 1951   Admit date: 03/23/2017 Date of Consult: 03/24/2017  Primary Physician: Craig Grippe, MD Primary Cardiologist: New  Requesting Provider: Dr. Selena Harmon Reason for Consultation: Chest pain  Craig Harmon is a 66 y.o. male who is being seen today for the evaluation of chest pain at the request of Dr. Selena Harmon.  Patient Profile    66 yo male with PMH of HTN, HL, GERD, Tobacco Use, and CABG 3v 8 years ago per patient who presented with chest pain.   Past Medical History   Past Medical History:  Diagnosis Date  . Arthritis   . CAD (coronary artery disease)   . GERD (gastroesophageal reflux disease)   . HLD (hyperlipidemia)   . HTN (hypertension)   . Hx of CABG   . Tobacco abuse     Past Surgical History:  Procedure Laterality Date  . BACK SURGERY    . CORONARY ARTERY BYPASS GRAFT  2008     Allergies  No Known Allergies  History of Present Illness    Craig Harmon is a 66 yo male with PMH of HTN, HL, GERD, Tobacco Use, and CABG 3v 8 years ago per patient. 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 States he was followed by Cardiology for short period of time, but no currently. Sounds like he has seen a PCP in [place], but not on regular basis. Reports having a stress test back in 2016 at [place], but records are filled in the chart. No other work up since his bypass. Reports being off his statin for about a year, thinks that his PCP stopped this medication. 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 Does not normally have anginal symptoms. Last night around 8pm developed centralized chest pressure with diaphoresis and radiation into the left arm. Pain lasted about 30 minutes, and he called EMS. On arrival he was given SL nitro and 324 ASA with relief of symptoms. Reports this is very similar to what he  experienced with his first MI. Continues to smoke 1ppd.   In the ED his labs showed stable electrolytes, Hgb 13.7, Trop neg x2. EKG SR with no acute ST/T wave changes. CXR was negative. He was started on IV heparin and admitted for further work up.   Inpatient Medications    . aspirin EC  81 mg Oral Daily  . atorvastatin  40 mg Oral q1800  . hydrochlorothiazide  25 mg Oral Daily  . lisinopril  20 mg Oral Daily  . metoprolol succinate  50 mg Oral QHS  . pantoprazole  40 mg Oral Daily  . sodium chloride flush  3 mL Intravenous Q12H  . umeclidinium bromide  1 puff Inhalation Daily    Family History    Family History  Problem Relation Age of Onset  . Heart attack Mother   . Alcohol abuse Father   . CAD Brother     Social History    Social History   Social History  . Marital status: Divorced    Spouse name: N/A  . Number of children: N/A  . Years of education: N/A   Occupational History  . Not on file.   Social History Main Topics  . Smoking status: Current Every Day Smoker    Packs/day: 1.00    Years: 45.00    Types: Cigarettes  .  Smokeless tobacco: Never Used  . Alcohol use Yes     Comment: occasionally  . Drug use: No  . Sexual activity: Not on file   Other Topics Concern  . Not on file   Social History Narrative  . No narrative on file     Review of Systems    See HPI  All other systems reviewed and are otherwise negative except as noted above.  Physical Exam    Blood pressure 103/73, pulse 60, temperature 98 F (36.7 C), temperature source Oral, resp. rate 18, height 5\' 8"  (1.727 m), weight 192 lb 9.6 oz (87.4 kg), SpO2 98 %.  General: Pleasant older WM, NAD Psych: Normal affect. Neuro: Alert and oriented X 3. Moves all extremities spontaneously. HEENT: Normal  Neck: Supple without bruits or JVD. Lungs:  Resp regular and unlabored, CTA. Heart: RRR no s3, s4, 3/6 systolic murmur. Abdomen: Soft, non-tender, non-distended, BS + x 4.  Extremities:  No clubbing, cyanosis or edema. DP/PT/Radials 2+ and equal bilaterally.  Labs    Troponin [facility](Point of Care Test)  Recent Labs  03/23/17 2204  TROPIPOC 0.00    Recent Labs  03/24/17 0210  TROPONINI <0.03   Lab Results  Component Value Date   WBC 9.3 03/23/2017   HGB 13.7 03/23/2017   HCT 38.9 (L) 03/23/2017   MCV 90.3 03/23/2017   PLT 221 03/23/2017    Recent Labs Lab 03/23/17 2142  NA 136  K 3.5  CL 104  CO2 22  BUN 22*  CREATININE 0.89  CALCIUM 8.9  GLUCOSE 87   Lab Results  Component Value Date   CHOL 254 (H) 06/24/2013   HDL 61 06/24/2013   LDLCALC 167 (H) 06/24/2013   TRIG 128 06/24/2013   No results found for: [facility]   Radiology Studies    Dg Chest 2 View  Result Date: 03/23/2017 CLINICAL DATA:  Left-sided chest pain radiating to the shoulder and arm since 2000 hours. EXAM: CHEST  2 VIEW COMPARISON:  01/08/2014 FINDINGS: The heart size and mediastinal contours are within normal limits. Moderate-sized hiatal hernia is superimposed on the normal sized heart. The patient is status post median sternotomy. Both lungs are clear. Chronic stable mild degenerative change along the dorsal spine. IMPRESSION: No active cardiopulmonary disease.  Moderate-sized hiatal hernia. Electronically Signed   By: Tollie Ethavid  Kwon M.D.   On: 03/23/2017 22:31    ECG & Cardiac Imaging    EKG: SR  Echo: Pending  Assessment & Plan    66 yo male with PMH of HTN, HL, GERD, Tobacco Use, and CABG 3v 8 years ago per patient who presented with chest pain.  1. Unstable Angina: Reports symptoms started last evening once he got home from work. Lasted around an hour until EMS arrived and was given SL nitro. [facility]. He continues to smoke, and reports stopping his statin about a year ago. Given these findings would recommend definitive cardiac cath to redefine coronary anatomy. Will plan for Monday.  -- IV heparin -- BB, ASA, statin, ACEi  2.  Systolic Murmur: Noted on exam, denies any hx of the same.  -- check echo  3. HTN: Stable with current therapy  4. HL: on statin -- check lipids  5. Tobacco Use: cessation advised  Craig CoffinSigned, Lindsay Roberts, NP-C Pager [phone] 03/24/2017, 7:38 AM   Attending Note:   The patient was seen and examined.  Agree with assessment and plan  as noted above.  Changes made to the above note as needed.  Patient seen and independently examined with Geoffry Paradise, NP .   We discussed all aspects of the encounter. I agree with the assessment and plan as stated above.  1.  Unstable angina:   Pt presents with symptoms of UAP.   Similar to his previous CP Does not get regular medical care.   Does not go to the doctor . Smoker, off his statin  I think a myoview would be potentially very difficult to intrepret with his hx of CABG.   We have no previoius studies to compare . Will schedule for cath - potentially R and L  Heart cath Ive discussed risks, benefits, options.  He understands and agrees to proceed on Monday   2.  Aortic stenosis / aortic insufficiency : Has murmur c/w AS/AI. Peripheral pulses are diminished Echo today   3.  Smoker - encouraged cessation   I have spent a total of 40 minutes with patient reviewing hospital  notes , telemetry, EKGs, labs and examining patient as well as establishing an assessment and plan that was discussed with the patient. > 50% of time was spent in direct patient care.    Vesta Mixer, Montez Hageman., MD, [facility] 03/24/2017, 8:57 AM [address]  [address] Office [phone] Pager [phone]

## 2017-03-25 DIAGNOSIS — I350 Nonrheumatic aortic (valve) stenosis: Secondary | ICD-10-CM

## 2017-03-25 DIAGNOSIS — F17200 Nicotine dependence, unspecified, uncomplicated: Secondary | ICD-10-CM

## 2017-03-25 LAB — LIPID PANEL
CHOL/HDL RATIO: 2.5 ratio
CHOLESTEROL: 168 mg/dL (ref 0–200)
HDL: 66 mg/dL (ref >40)
LDL Cholesterol: 90 mg/dL (ref 0–99)
Triglycerides: 62 mg/dL (ref <150)
VLDL: 12 mg/dL (ref 0–40)

## 2017-03-25 LAB — HEPARIN LEVEL (UNFRACTIONATED): HEPARIN UNFRACTIONATED: 0.55 [IU]/mL (ref 0.30–0.70)

## 2017-03-25 LAB — CBC
HCT: 39.1 % (ref 39.0–52.0)
Hemoglobin: 13.3 g/dL (ref 13.0–17.0)
MCH: 30.9 pg (ref 26.0–34.0)
MCHC: 34 g/dL (ref 30.0–36.0)
MCV: 90.9 fL (ref 78.0–100.0)
PLATELETS: 215 10*3/uL (ref 150–400)
RBC: 4.3 MIL/uL (ref 4.22–5.81)
RDW: 13.6 % (ref 11.5–15.5)
WBC: 7.2 10*3/uL (ref 4.0–10.5)

## 2017-03-25 MED ORDER — SODIUM CHLORIDE 0.9 % WEIGHT BASED INFUSION
3.0000 mL/kg/h | INTRAVENOUS | Status: DC
  Administered 2017-03-26: 3 mL/kg/h via INTRAVENOUS

## 2017-03-25 MED ORDER — SODIUM CHLORIDE 0.9 % WEIGHT BASED INFUSION
1.0000 mL/kg/h | INTRAVENOUS | Status: DC
  Administered 2017-03-26: 1 mL/kg/h via INTRAVENOUS

## 2017-03-25 MED ORDER — SODIUM CHLORIDE 0.9 % IV SOLN: 250.0000 mL | INTRAVENOUS | Status: DC | PRN

## 2017-03-25 MED ORDER — SODIUM CHLORIDE 0.9% FLUSH
3.0000 mL | Freq: Two times a day (BID) | INTRAVENOUS | Status: DC
  Administered 2017-03-26: 3 mL via INTRAVENOUS

## 2017-03-25 MED ORDER — SODIUM CHLORIDE 0.9% FLUSH: 3.0000 mL | INTRAVENOUS | Status: DC | PRN

## 2017-03-25 MED ORDER — ASPIRIN 81 MG PO CHEW
81.0000 mg | CHEWABLE_TABLET | ORAL | Status: AC
  Administered 2017-03-26: 81 mg via ORAL
  Filled 2017-03-25: qty 1

## 2017-03-25 NOTE — Progress Notes (Signed)
PROGRESS NOTE    Craig Harmon  ZOX:[id number] DOB: 03-Aug-1951 DOA: 03/23/2017 PCP: Pearson Grippe, MD   Brief Narrative: 66 yo male with PMH of HTN, HL, GERD, Tobacco Use, and CABG 3v 8 years ago per patient presented with exertional chest pain associated with shortness of breath. Patient initially presented to [facility] ER and transferred to [facility] for the evaluation of unstable angina. IV heparin started and cardiology consult.  Assessment & Plan:  # chest pain/unstable angina: -Admitted with exertional chest pain. He was ambulating in the hallway today. Denied chest pain shortness of breath. Plan for cardiac cath tomorrow as per cardiologist. -Continue IV heparin, aspirin, Lipitor, lisinopril, metoprolol. Continue telemetry monitoring.  -Troponin and EKG unremarkable.  #Aortic stenosis/aortic insufficiency: Patient has heart murmur. Echocardiogram with moderate aortic stenosis. Also with a grade 2 diastolic dysfunction.  #Hypertension: Monitor blood pressure. Continue current medication including hydrochlorothiazide, lisinopril and metoprolol.  #Dyslipidemia: Continue statin. Check lipid panel.  #Tobacco dependence: Patient was educated on smoking cessation. On nicotine patch.  DVT prophylaxis: IV heparin Code Status: Full code Family Communication: No family at bedside. Disposition Plan: Likely discharge home in 2-3 days, waiting for cath    Consultants:   Cardiology  Procedures: Pending echo Antimicrobials: None  Subjective: Seen and examined at bedside. Patient was walking in the hallway. Denied headache, dizziness, chest pain, shortness of breath. Objective: Vitals:   03/24/17 2118 03/25/17 0059 03/25/17 0536 03/25/17 0802  BP: 124/74 133/77 115/68   Pulse: 67 64 62   Resp: 18 18 18    Temp: 97.9 F (36.6 C) (!) 97.4 F (36.3 C) (!) 97.4 F (36.3 C)   TempSrc: Oral Oral Oral   SpO2: 98% 98% 97% 98%  Weight:   87.9 kg (193 lb 11.2 oz)   Height:         Intake/Output Summary (Last 24 hours) at 03/25/17 1154 Last data filed at 03/25/17 1120  Gross per 24 hour  Intake             1200 ml  Output             3900 ml  Net            -2700 ml   Filed Weights   03/23/17 2138 03/24/17 0143 03/25/17 0536  Weight: 88.5 kg (195 lb) 87.4 kg (192 lb 9.6 oz) 87.9 kg (193 lb 11.2 oz)    Examination:  General exam: Not in distress  Respiratory system: Clear bilaterally, respiratory effort normal Cardiovascular system: Regular rate rhythm S1-S2 normal. No pedal edema. Systolic murmur. Gastrointestinal system: Abdomen is nondistended, soft and nontender. Normal bowel sounds heard. Central nervous system: Alert and oriented. No focal neurological deficits. Extremities: Symmetric 5 x 5 power. Skin: No rashes, lesions or ulcers    Data Reviewed: I have personally reviewed following labs and imaging studies  CBC:  Recent Labs Lab 03/23/17 2142 03/24/17 1257 03/25/17 0519  WBC 9.3 7.1 7.2  HGB 13.7 13.0 13.3  HCT 38.9* 38.6* 39.1  MCV 90.3 92.1 90.9  PLT 221 214 215   Basic Metabolic Panel:  Recent Labs Lab 03/23/17 2142  NA 136  K 3.5  CL 104  CO2 22  GLUCOSE 87  BUN 22*  CREATININE 0.89  CALCIUM 8.9   GFR: Estimated Creatinine Clearance: 90.4 mL/min (by C-G formula based on SCr of 0.89 mg/dL). Liver Function Tests: No results for input(s): AST, ALT, ALKPHOS, BILITOT, PROT, ALBUMIN in the last 168 hours. No results for  input(s): LIPASE, AMYLASE in the last 168 hours. No results for input(s): AMMONIA in the last 168 hours. Coagulation Profile:  Recent Labs Lab 03/23/17 2142  INR 1.01   Cardiac Enzymes:  Recent Labs Lab 03/24/17 0210 03/24/17 0718 03/24/17 1257  TROPONINI <0.03 <0.03 <0.03   BNP (last 3 results) No results for input(s): PROBNP in the last 8760 hours. HbA1C: No results for input(s): HGBA1C in the last 72 hours. CBG: No results for input(s): GLUCAP in the last 168 hours. Lipid  Profile:  Recent Labs  03/25/17 0519  CHOL 168  HDL 66  LDLCALC 90  TRIG 62  CHOLHDL 2.5   Thyroid Function Tests: No results for input(s): TSH, T4TOTAL, FREET4, T3FREE, THYROIDAB in the last 72 hours. Anemia Panel: No results for input(s): VITAMINB12, FOLATE, FERRITIN, TIBC, IRON, RETICCTPCT in the last 72 hours. Sepsis Labs: No results for input(s): PROCALCITON, LATICACIDVEN in the last 168 hours.  No results found for this or any previous visit (from the past 240 hour(s)).       Radiology Studies: Dg Chest 2 View  Result Date: 03/23/2017 CLINICAL DATA:  Left-sided chest pain radiating to the shoulder and arm since 2000 hours. EXAM: CHEST  2 VIEW COMPARISON:  01/08/2014 FINDINGS: The heart size and mediastinal contours are within normal limits. Moderate-sized hiatal hernia is superimposed on the normal sized heart. The patient is status post median sternotomy. Both lungs are clear. Chronic stable mild degenerative change along the dorsal spine. IMPRESSION: No active cardiopulmonary disease.  Moderate-sized hiatal hernia. Electronically Signed   By: Tollie Ethavid  Kwon M.D.   On: 03/23/2017 22:31        Scheduled Meds: . aspirin EC  81 mg Oral Daily  . atorvastatin  40 mg Oral q1800  . hydrochlorothiazide  25 mg Oral Daily  . lisinopril  20 mg Oral Daily  . metoprolol succinate  50 mg Oral QHS  . nicotine  21 mg Transdermal Daily  . pantoprazole  40 mg Oral Daily  . sodium chloride flush  3 mL Intravenous Q12H  . umeclidinium bromide  1 puff Inhalation Daily   Continuous Infusions: . heparin 1,500 Units/hr (03/25/17 1018)     LOS: 2 days    Karia Ehresman Jaynie CollinsPrasad Fabienne Nolasco, MD [facility] Pager [phone]  If 7PM-7AM, please contact night-coverage [email or web address] Password [email or web address] 03/25/2017, 11:54 AM

## 2017-03-25 NOTE — Progress Notes (Signed)
ANTICOAGULATION CONSULT NOTE - Follow Up Consult  Pharmacy Consult for heparin Indication: chest pain/ACS  No Known Allergies  Patient Measurements: Height: 5\' 8"  (172.7 cm) Weight: 193 lb 11.2 oz (87.9 kg) (Scale A) IBW/kg (Calculated) : 68.4 Heparin Dosing Weight: 86.1 Kg  Vital Signs: Temp: 97.4 F (36.3 C) (07/22 0536) Temp Source: Oral (07/22 0536) BP: 115/68 (07/22 0536) Pulse Rate: 62 (07/22 0536)  Labs:  Recent Labs  03/23/17 2142 03/24/17 0210  03/24/17 0718 03/24/17 1257 03/24/17 2059 03/25/17 0519  HGB 13.7  --   --   --  13.0  --  13.3  HCT 38.9*  --   --   --  38.6*  --  39.1  PLT 221  --   --   --  214  --  215  APTT 32  --   --   --   --   --   --   LABPROT 13.3  --   --   --   --   --   --   INR 1.01  --   --   --   --   --   --   HEPARINUNFRC  --   --   < >  --  <0.10* 0.35 0.55  CREATININE 0.89  --   --   --   --   --   --   TROPONINI  --  <0.03  --  <0.03 <0.03  --   --   < > = values in this interval not displayed.  Estimated Creatinine Clearance: 90.4 mL/min (by C-G formula based on SCr of 0.89 mg/dL).   Medications:  Prescriptions Prior to Admission  Medication Sig Dispense Refill Last Dose  . albuterol (PROVENTIL HFA;VENTOLIN HFA) 108 (90 BASE) MCG/ACT inhaler Inhale 2 puffs into the lungs every 6 (six) hours as needed for wheezing or shortness of breath. 1 Inhaler 5 03/23/2017 at Unknown time  . Cholecalciferol (VITAMIN D3) 1000 units CAPS Take 1 capsule by mouth daily.    03/23/2017 at Unknown time  . esomeprazole (NEXIUM) 20 MG capsule Take 20 mg by mouth daily at 12 noon.   03/23/2017 at Unknown time  . fluticasone (FLONASE) 50 MCG/ACT nasal spray Place 1 spray into both nostrils daily.   03/23/2017 at Unknown time  . lisinopril-hydrochlorothiazide (PRINZIDE,ZESTORETIC) 20-25 MG per tablet Take 1 tablet by mouth daily. 30 tablet 2 03/23/2017 at Unknown time  . metoprolol succinate (TOPROL-XL) 50 MG 24 hr tablet Take 1 tablet (50 mg total) by  mouth daily. (Patient taking differently: Take 50 mg by mouth at bedtime. ) 30 tablet 2 03/22/2017 at 2100  . umeclidinium bromide (INCRUSE ELLIPTA) 62.5 MCG/INH AEPB Inhale 1 puff into the lungs daily.    unknown at Unknown time    Assessment: 66 yo male [facility] in patient with know CAD s/p CABG ~8 years ago. No anticoagulation prior to admission. Baseline CBC WNL. No bleeding or infusion issues documented.  Heparin level therapeutic: 0.55  Goal of Therapy:  Heparin level 0.3-0.7 units/ml Monitor platelets by anticoagulation protocol: Yes   Plan:  Continue heparin infusion to 1450 units/hr Check HL daily Continue to monitor H&H and platelets  Ladell PierBrooke Jatoya Armbrister, PharmD Pharmacy Resident Pager: [phone] 03/25/2017 10:16 AM

## 2017-03-25 NOTE — Progress Notes (Addendum)
Pt continues to request to go off unit to "get fresh air," (smoke).  Have educated pt that this is not allowed, as CCMD would not be able to monitor his heart rhythm/rate.  Have instructed/encouraged pt to ambulate [address] hallways, but is not permitted to ambulate past the open double doors at unit entrance.

## 2017-03-25 NOTE — Progress Notes (Signed)
Progress Note  Patient Name: Craig Harmon Date of Encounter: 03/25/2017  Primary Cardiologist: new, Izik Bingman  Subjective   66 yo with CP,  Hx of HTN, Hyperlipidemia,  Hx of CABG   Pt is off the floor , smoking   Inpatient Medications    Scheduled Meds: . aspirin EC  81 mg Oral Daily  . atorvastatin  40 mg Oral q1800  . hydrochlorothiazide  25 mg Oral Daily  . lisinopril  20 mg Oral Daily  . metoprolol succinate  50 mg Oral QHS  . nicotine  21 mg Transdermal Daily  . pantoprazole  40 mg Oral Daily  . sodium chloride flush  3 mL Intravenous Q12H  . umeclidinium bromide  1 puff Inhalation Daily   Continuous Infusions: . heparin 1,500 Units/hr (03/24/17 2215)   PRN Meds: acetaminophen **OR** acetaminophen, albuterol, morphine injection, ondansetron **OR** ondansetron (ZOFRAN) IV   Vital Signs    Vitals:   03/24/17 2118 03/25/17 0059 03/25/17 0536 03/25/17 0802  BP: 124/74 133/77 115/68   Pulse: 67 64 62   Resp: 18 18 18    Temp: 97.9 F (36.6 C) (!) 97.4 F (36.3 C) (!) 97.4 F (36.3 C)   TempSrc: Oral Oral Oral   SpO2: 98% 98% 97% 98%  Weight:   193 lb 11.2 oz (87.9 kg)   Height:        Intake/Output Summary (Last 24 hours) at 03/25/17 0948 Last data filed at 03/25/17 1610  Gross per 24 hour  Intake             1200 ml  Output             2975 ml  Net            -1775 ml   Filed Weights   03/23/17 2138 03/24/17 0143 03/25/17 0536  Weight: 195 lb (88.5 kg) 192 lb 9.6 oz (87.4 kg) 193 lb 11.2 oz (87.9 kg)    Telemetry    NSR  - Personally Reviewed  ECG     NSR  - Personally Reviewed  Physical Exam    Labs    Chemistry Recent Labs Lab 03/23/17 2142  NA 136  K 3.5  CL 104  CO2 22  GLUCOSE 87  BUN 22*  CREATININE 0.89  CALCIUM 8.9  GFRNONAA >60  GFRAA >60  ANIONGAP 10     Hematology Recent Labs Lab 03/23/17 2142 03/24/17 1257 03/25/17 0519  WBC 9.3 7.1 7.2  RBC 4.31 4.19* 4.30  HGB 13.7 13.0 13.3  HCT 38.9* 38.6* 39.1    MCV 90.3 92.1 90.9  MCH 31.8 31.0 30.9  MCHC 35.2 33.7 34.0  RDW 13.2 13.6 13.6  PLT 221 214 215    Cardiac Enzymes Recent Labs Lab 03/24/17 0210 03/24/17 0718 03/24/17 1257  TROPONINI <0.03 <0.03 <0.03    Recent Labs Lab 03/23/17 2204  TROPIPOC 0.00     BNPNo results for input(s): BNP, PROBNP in the last 168 hours.   DDimer No results for input(s): DDIMER in the last 168 hours.   Radiology    Dg Chest 2 View  Result Date: 03/23/2017 CLINICAL DATA:  Left-sided chest pain radiating to the shoulder and arm since 2000 hours. EXAM: CHEST  2 VIEW COMPARISON:  01/08/2014 FINDINGS: The heart size and mediastinal contours are within normal limits. Moderate-sized hiatal hernia is superimposed on the normal sized heart. The patient is status post median sternotomy. Both lungs are clear. Chronic stable mild degenerative change  along the dorsal spine. IMPRESSION: No active cardiopulmonary disease.  Moderate-sized hiatal hernia. Electronically Signed   By: Tollie Ethavid  Kwon M.D.   On: 03/23/2017 22:31    Cardiac Studies     Patient Profile     66 y.o. male with CAD  Assessment & Plan    1.    CAD :  Likely has worsening  CAD.    He is currently off the floor ( likely smoking)  He is tentatively set up for cath tomorrow   advised smoking cessation   Discussed risks, benefits, options .  He understands and  Agrees to proceed with cath   2.  Aortic stenosis.  Moderate by echo. Will plan on doing a right heart cath also     Signed, Kristeen MissPhilip Amee Boothe, MD  03/25/2017, 9:48 AM

## 2017-03-26 ENCOUNTER — Encounter (HOSPITAL_COMMUNITY)
Admission: EM | Disposition: A | Discharge status: Home / Self Care | Payer: Self-pay | Source: Home / Self Care | Attending: Nephrology

## 2017-03-26 DIAGNOSIS — I25110 Atherosclerotic heart disease of native coronary artery with unstable angina pectoris: Principal | ICD-10-CM

## 2017-03-26 HISTORY — PX: RIGHT/LEFT HEART CATH AND CORONARY/GRAFT ANGIOGRAPHY: CATH118267

## 2017-03-26 HISTORY — PX: CORONARY BALLOON ANGIOPLASTY: CATH118233

## 2017-03-26 HISTORY — PX: CORONARY STENT INTERVENTION: CATH118234

## 2017-03-26 LAB — CBC
HCT: 39.5 % (ref 39.0–52.0)
Hemoglobin: 13.6 g/dL (ref 13.0–17.0)
MCH: 31.1 pg (ref 26.0–34.0)
MCHC: 34.4 g/dL (ref 30.0–36.0)
MCV: 90.2 fL (ref 78.0–100.0)
PLATELETS: 228 10*3/uL (ref 150–400)
RBC: 4.38 MIL/uL (ref 4.22–5.81)
RDW: 13.5 % (ref 11.5–15.5)
WBC: 7.4 10*3/uL (ref 4.0–10.5)

## 2017-03-26 LAB — POCT I-STAT 3, ART BLOOD GAS (G3+)
Acid-Base Excess: 4 mmol/L — ABNORMAL HIGH (ref 0.0–2.0)
Bicarbonate: 27.5 mmol/L (ref 20.0–28.0)
O2 Saturation: 99 %
PH ART: 7.493 — AB (ref 7.350–7.450)
PO2 ART: 145 mmHg — AB (ref 83.0–108.0)
TCO2: 29 mmol/L (ref 0–100)
pCO2 arterial: 35.8 mmHg (ref 32.0–48.0)

## 2017-03-26 LAB — POCT I-STAT 3, VENOUS BLOOD GAS (G3P V)
Acid-Base Excess: 1 mmol/L (ref 0.0–2.0)
Bicarbonate: 26 mmol/L (ref 20.0–28.0)
O2 Saturation: 66 %
PCO2 VEN: 40.5 mmHg — AB (ref 44.0–60.0)
TCO2: 27 mmol/L (ref 0–100)
pH, Ven: 7.416 (ref 7.250–7.430)
pO2, Ven: 34 mmHg (ref 32.0–45.0)

## 2017-03-26 LAB — HEPARIN LEVEL (UNFRACTIONATED): HEPARIN UNFRACTIONATED: 0.37 [IU]/mL (ref 0.30–0.70)

## 2017-03-26 LAB — POCT ACTIVATED CLOTTING TIME
ACTIVATED CLOTTING TIME: 268 s
ACTIVATED CLOTTING TIME: 131 s

## 2017-03-26 SURGERY — RIGHT/LEFT HEART CATH AND CORONARY/GRAFT ANGIOGRAPHY
Anesthesia: LOCAL

## 2017-03-26 MED ORDER — HEPARIN (PORCINE) IN NACL 2-0.9 UNIT/ML-% IJ SOLN
INTRAMUSCULAR | Status: AC | PRN
  Administered 2017-03-26: 1000 mL

## 2017-03-26 MED ORDER — HEPARIN SODIUM (PORCINE) 1000 UNIT/ML IJ SOLN
INTRAMUSCULAR | Status: AC
  Filled 2017-03-26: qty 1

## 2017-03-26 MED ORDER — SODIUM CHLORIDE 0.9% FLUSH: 3.0000 mL | Freq: Two times a day (BID) | INTRAVENOUS | Status: DC

## 2017-03-26 MED ORDER — IOPAMIDOL (ISOVUE-370) INJECTION 76%
INTRAVENOUS | Status: AC
  Filled 2017-03-26: qty 100

## 2017-03-26 MED ORDER — MIDAZOLAM HCL 2 MG/2ML IJ SOLN
INTRAMUSCULAR | Status: AC
  Filled 2017-03-26: qty 2

## 2017-03-26 MED ORDER — CLOPIDOGREL BISULFATE 300 MG PO TABS
ORAL_TABLET | ORAL | Status: AC
  Filled 2017-03-26: qty 1

## 2017-03-26 MED ORDER — NITROGLYCERIN 1 MG/10 ML FOR IR/CATH LAB
INTRA_ARTERIAL | Status: DC | PRN
  Administered 2017-03-26 (×2): 150 ug via INTRACORONARY

## 2017-03-26 MED ORDER — LABETALOL HCL 5 MG/ML IV SOLN: 10.0000 mg | INTRAVENOUS | Status: AC | PRN

## 2017-03-26 MED ORDER — FENTANYL CITRATE (PF) 100 MCG/2ML IJ SOLN
INTRAMUSCULAR | Status: DC | PRN
  Administered 2017-03-26: 25 ug via INTRAVENOUS
  Administered 2017-03-26: 50 ug via INTRAVENOUS
  Administered 2017-03-26: 25 ug via INTRAVENOUS

## 2017-03-26 MED ORDER — SODIUM CHLORIDE 0.9 % WEIGHT BASED INFUSION: 1.0000 mL/kg/h | INTRAVENOUS | Status: AC

## 2017-03-26 MED ORDER — HYDRALAZINE HCL 20 MG/ML IJ SOLN: 5.0000 mg | INTRAMUSCULAR | Status: AC | PRN

## 2017-03-26 MED ORDER — MORPHINE SULFATE (PF) 4 MG/ML IV SOLN: 2.0000 mg | INTRAVENOUS | Status: DC | PRN

## 2017-03-26 MED ORDER — SODIUM CHLORIDE 0.9 % IV SOLN: 250.0000 mL | INTRAVENOUS | Status: DC | PRN

## 2017-03-26 MED ORDER — CLOPIDOGREL BISULFATE 75 MG PO TABS
75.0000 mg | ORAL_TABLET | Freq: Every day | ORAL | Status: DC
  Administered 2017-03-27: 75 mg via ORAL
  Filled 2017-03-26: qty 1

## 2017-03-26 MED ORDER — LIDOCAINE HCL (PF) 1 % IJ SOLN
INTRAMUSCULAR | Status: DC | PRN
  Administered 2017-03-26 (×2): 2 mL

## 2017-03-26 MED ORDER — HEPARIN (PORCINE) IN NACL 2-0.9 UNIT/ML-% IJ SOLN
INTRAMUSCULAR | Status: AC
  Filled 2017-03-26: qty 1000

## 2017-03-26 MED ORDER — VERAPAMIL HCL 2.5 MG/ML IV SOLN
INTRAVENOUS | Status: DC | PRN
  Administered 2017-03-26: 10 mL via INTRA_ARTERIAL

## 2017-03-26 MED ORDER — FENTANYL CITRATE (PF) 100 MCG/2ML IJ SOLN
INTRAMUSCULAR | Status: AC
  Filled 2017-03-26: qty 2

## 2017-03-26 MED ORDER — LIDOCAINE HCL (PF) 1 % IJ SOLN
INTRAMUSCULAR | Status: AC
  Filled 2017-03-26: qty 30

## 2017-03-26 MED ORDER — VERAPAMIL HCL 2.5 MG/ML IV SOLN
INTRAVENOUS | Status: AC
  Filled 2017-03-26: qty 2

## 2017-03-26 MED ORDER — IOPAMIDOL (ISOVUE-370) INJECTION 76%
INTRAVENOUS | Status: DC | PRN
  Administered 2017-03-26: 170 mL via INTRA_ARTERIAL

## 2017-03-26 MED ORDER — METOPROLOL SUCCINATE ER 50 MG PO TB24
50.0000 mg | ORAL_TABLET | Freq: Every day | ORAL | Status: DC
  Administered 2017-03-26: 50 mg via ORAL
  Filled 2017-03-26 (×2): qty 1

## 2017-03-26 MED ORDER — SODIUM CHLORIDE 0.9% FLUSH: 3.0000 mL | INTRAVENOUS | Status: DC | PRN

## 2017-03-26 MED ORDER — HEPARIN SODIUM (PORCINE) 1000 UNIT/ML IJ SOLN
INTRAMUSCULAR | Status: DC | PRN
  Administered 2017-03-26: 3000 [IU] via INTRAVENOUS
  Administered 2017-03-26 (×2): 5000 [IU] via INTRAVENOUS

## 2017-03-26 MED ORDER — CLOPIDOGREL BISULFATE 300 MG PO TABS
ORAL_TABLET | ORAL | Status: DC | PRN
  Administered 2017-03-26: 600 mg via ORAL

## 2017-03-26 MED ORDER — MIDAZOLAM HCL 2 MG/2ML IJ SOLN
INTRAMUSCULAR | Status: DC | PRN
  Administered 2017-03-26: 1 mg via INTRAVENOUS
  Administered 2017-03-26: 2 mg via INTRAVENOUS

## 2017-03-26 SURGICAL SUPPLY — 22 items
BALLN SAPPHIRE ~~LOC~~ 4.0X15 (BALLOONS) ×2 IMPLANT
BALLN WOLVERINE 2.50X15 (BALLOONS) ×2
BALLN ~~LOC~~ EMERGE MR 2.5X15 (BALLOONS) ×2
BALLOON WOLVERINE 2.50X15 (BALLOONS) ×1 IMPLANT
BALLOON ~~LOC~~ EMERGE MR 2.5X15 (BALLOONS) ×1 IMPLANT
CATH BALLN WEDGE 5F 110CM (CATHETERS) ×2 IMPLANT
CATH INFINITI 5 FR MPA2 (CATHETERS) ×2 IMPLANT
CATH INFINITI 5FR AL1 (CATHETERS) ×2 IMPLANT
CATH INFINITI 5FR MULTPACK ANG (CATHETERS) ×2 IMPLANT
CATH LAUNCHER 6FR EBU3.5 (CATHETERS) ×2 IMPLANT
GLIDESHEATH SLEND SS 6F .021 (SHEATH) ×2 IMPLANT
GUIDEWIRE INQWIRE 1.5J.035X260 (WIRE) ×1 IMPLANT
INQWIRE 1.5J .035X260CM (WIRE) ×2
KIT ENCORE 26 ADVANTAGE (KITS) ×2 IMPLANT
KIT HEART LEFT (KITS) ×2 IMPLANT
PACK CARDIAC CATHETERIZATION (CUSTOM PROCEDURE TRAY) ×2 IMPLANT
SHEATH GLIDE SLENDER 4/5FR (SHEATH) ×2 IMPLANT
STENT SYNERGY DES 3.5X20 (Permanent Stent) ×2 IMPLANT
TRANSDUCER W/STOPCOCK (MISCELLANEOUS) ×2 IMPLANT
TUBING CIL FLEX 10 FLL-RA (TUBING) ×2 IMPLANT
WIRE COUGAR XT STRL 190CM (WIRE) ×2 IMPLANT
WIRE MICROINTRODUCER 60CM (WIRE) ×2 IMPLANT

## 2017-03-26 NOTE — Interval H&P Note (Signed)
Cath Lab Visit (complete for each Cath Lab visit)  Clinical Evaluation Leading to the Procedure:   ACS: Yes.    Non-ACS:    Anginal Classification: CCS IV  Anti-ischemic medical therapy: Minimal Therapy (1 class of medications)  Non-Invasive Test Results: No non-invasive testing performed  Prior CABG: Previous CABG  History and Physical Interval Note:  03/26/2017 3:03 PM  Craig Harmon  has presented today for surgery, with the diagnosis of unstable angina  The various methods of treatment have been discussed with the patient and family. After consideration of risks, benefits and other options for treatment, the patient has consented to  Procedure(s): Right/Left Heart Cath and Coronary/Graft Angiography (N/A) as a surgical intervention .  The patient's history has been reviewed, patient examined, no change in status, stable for surgery.  I have reviewed the patient's chart and labs.  Questions were answered to the patient's satisfaction.     Tonny Bollmanooper, Kristeena Meineke

## 2017-03-26 NOTE — Progress Notes (Addendum)
Progress Note  Patient Name: Craig Harmon Date of Encounter: 03/26/2017  Primary Cardiologist: Dr Elease Hashimoto  Subjective   No chest pain overnight  Inpatient Medications    Scheduled Meds: . aspirin EC  81 mg Oral Daily  . atorvastatin  40 mg Oral q1800  . hydrochlorothiazide  25 mg Oral Daily  . lisinopril  20 mg Oral Daily  . metoprolol succinate  50 mg Oral QHS  . nicotine  21 mg Transdermal Daily  . pantoprazole  40 mg Oral Daily  . sodium chloride flush  3 mL Intravenous Q12H  . sodium chloride flush  3 mL Intravenous Q12H  . umeclidinium bromide  1 puff Inhalation Daily   Continuous Infusions: . sodium chloride    . sodium chloride 1 mL/kg/hr (03/26/17 0740)  . heparin 1,500 Units/hr (03/26/17 0333)   PRN Meds: sodium chloride, acetaminophen **OR** acetaminophen, albuterol, morphine injection, ondansetron **OR** ondansetron (ZOFRAN) IV, sodium chloride flush   Vital Signs    Vitals:   03/25/17 2133 03/26/17 0526 03/26/17 0533 03/26/17 0808  BP: 102/61 (!) 91/50    Pulse: 63 (!) 54    Resp: 18 14    Temp: (!) 97.5 F (36.4 C) 97.7 F (36.5 C)    TempSrc: Oral Oral    SpO2: 95% 98%  97%  Weight:   193 lb 8 oz (87.8 kg)   Height:        Intake/Output Summary (Last 24 hours) at 03/26/17 0836 Last data filed at 03/26/17 0836  Gross per 24 hour  Intake          4471.27 ml  Output             3725 ml  Net           746.27 ml   Filed Weights   03/24/17 0143 03/25/17 0536 03/26/17 0533  Weight: 192 lb 9.6 oz (87.4 kg) 193 lb 11.2 oz (87.9 kg) 193 lb 8 oz (87.8 kg)    Telemetry    NSR, PVCs - Personally Reviewed  ECG    NSR - Personally Reviewed  Physical Exam   GEN: No acute distress.   Neck: No JVD, no bruit Cardiac: RRR, 2/6 systolic murmur AOV, diminished S2, rubs, or gallops.  Extrem: decreased femoral and distal pulses, no RFA bruit noted Respiratory: Clear to auscultation bilaterally. MS: No edema; No deformity. Neuro:  Nonfocal    Psych: Normal affect   Labs    Chemistry Recent Labs Lab 03/23/17 2142  NA 136  K 3.5  CL 104  CO2 22  GLUCOSE 87  BUN 22*  CREATININE 0.89  CALCIUM 8.9  GFRNONAA >60  GFRAA >60  ANIONGAP 10     Hematology Recent Labs Lab 03/24/17 1257 03/25/17 0519 03/26/17 0354  WBC 7.1 7.2 7.4  RBC 4.19* 4.30 4.38  HGB 13.0 13.3 13.6  HCT 38.6* 39.1 39.5  MCV 92.1 90.9 90.2  MCH 31.0 30.9 31.1  MCHC 33.7 34.0 34.4  RDW 13.6 13.6 13.5  PLT 214 215 228    Cardiac Enzymes Recent Labs Lab 03/24/17 0210 03/24/17 0718 03/24/17 1257  TROPONINI <0.03 <0.03 <0.03    Recent Labs Lab 03/23/17 2204  TROPIPOC 0.00     BNPNo results for input(s): BNP, PROBNP in the last 168 hours.   DDimer No results for input(s): DDIMER in the last 168 hours.   Radiology    CXR-2V- 03/23/17-  IMPRESSION: No active cardiopulmonary disease.  Moderate-sized hiatal hernia.  Cardiac Studies   Echo 03/24/17- Study Conclusions  - Left ventricle: The cavity size was normal. Wall thickness was   increased in a pattern of mild LVH. There was focal basal   hypertrophy. Systolic function was normal. The estimated ejection   fraction was in the range of 60% to 65%. Wall motion was normal;   there were no regional wall motion abnormalities. Features are   consistent with a pseudonormal left ventricular filling pattern,   with concomitant abnormal relaxation and increased filling   pressure (grade 2 diastolic dysfunction). - Aortic valve: Mildly to moderately calcified annulus. Moderately   thickened, moderately calcified leaflets. Valve mobility was   moderately restricted. There was moderate stenosis. Valve area   (VTI): 1.14 cm^2. Valve area (Vmax): 1.05 cm^2. Valve area   (Vmean): 1.06 cm^2. - Mitral valve: Mildly calcified annulus.  Patient Profile     66 y.o. 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 Echo  showed normal LVF with moderate AS.   Assessment & Plan    1. Unstable Angina:  Known hx of CAD with [facility] about 8 years ago.  -- IV heparin -- BB, ASA, statin, ACEi -- Rt and Lt heart cath today  2. Moderate AS: documented by echo this admission  3. HCVD: normal LVF with LVH and grade 2 DD by echo. His B/P is actually running low- will hold ACE today pre cath.   4. HL:  not on statin prior to admission- LDL 90  5. Tobacco Use: cessation advised  Plan: Hold ACE today-B/P low-? If he was taking this prior to admission, pt says he was taking "two B/P medications"  Signed, Corine ShelterLuke Kilroy, PA-C  03/26/2017, 8:36 AM     Patient seen and examined. Agree with assessment and plan. No recurrent chest pain. No JVD. No rales, Harsh 2/6 systolic murmur c/w AS. For right and left heart cath today. Hold ACE-I and HCTZ today. The risks and benefits of a cardiac catheterization including, but not limited to, death, stroke, MI, kidney damage and bleeding were discussed with the patient who indicates understanding and agrees to proceed.    Lennette Biharihomas A. Kelly, MD, [facility] 03/26/2017 9:32 AM

## 2017-03-26 NOTE — Progress Notes (Signed)
PROGRESS NOTE    Craig AntuRichard W Harmon  ZOX:[id number] DOB: October 25, 1950 DOA: 03/23/2017 PCP: Pearson GrippeKim, James, MD   Brief Narrative: 66 yo male with PMH of HTN, HL, GERD, Tobacco Use, and CABG 3v 8 years ago per patient presented with exertional chest pain associated with shortness of breath. Patient initially presented to [facility] and [facility] the evaluation of unstable angina. IV heparin started and cardiology consult.  Assessment & Plan:  # chest pain/unstable angina: -Admitted with exertional chest pain. He was ambulating in the hallway today. Denied chest pain shortness of breath.  -Planned for cardiac cath by cardiology today. Continue aspirin, Lipitor, IV heparin and metoprolol. Holding lisinopril and hydrochlorothiazide because of hypotension. Denied chest pain or shortness of breath today. Cardio is a consult appreciated. -Continue telemetry monitoring.  -Troponin and EKG unremarkable.  #Aortic stenosis/aortic insufficiency: Patient has heart murmur. Echocardiogram with moderate aortic stenosis. Also with a grade 2 diastolic dysfunction.  #Hypertension: Low blood pressure today. Asymptomatic. Holding hydrochlorothiazide and lisinopril.  #Dyslipidemia: Continue statin. LDL 90.  #Tobacco dependence: Patient was educated on smoking cessation. On nicotine patch.  DVT prophylaxis: IV heparin Code Status: Full code Family Communication: No family at bedside. Disposition Plan: Likely discharge home in 1-2 days, waiting for cath    Consultants:   Cardiology  Procedures:  echo Antimicrobials: None  Subjective: Seen and examined at bedside. Denied headache, dizziness, nausea vomiting chest pain or shortness of breath.   Objective: Vitals:   03/25/17 2133 03/26/17 0526 03/26/17 0533 03/26/17 0808  BP: 102/61 (!) 91/50    Pulse: 63 (!) 54    Resp: 18 14    Temp: (!) 97.5 F (36.4 C) 97.7 F (36.5 C)    TempSrc: Oral Oral    SpO2: 95% 98%  97%  Weight:   87.8 kg  (193 lb 8 oz)   Height:        Intake/Output Summary (Last 24 hours) at 03/26/17 1031 Last data filed at 03/26/17 0954  Gross per 24 hour  Intake          4589.27 ml  Output             3425 ml  Net          1164.27 ml   Filed Weights   03/24/17 0143 03/25/17 0536 03/26/17 0533  Weight: 87.4 kg (192 lb 9.6 oz) 87.9 kg (193 lb 11.2 oz) 87.8 kg (193 lb 8 oz)    Examination:  General exam: Lying on bed comfortable, not in distress Respiratory system: Clear bilateral, respiratory effort normal Cardiovascular system: Regular rate and rhythm, S1-S2 normal. Systolic murmur. No pedal edema. Gastrointestinal system: Abdomen is nondistended, soft and nontender. Normal bowel sounds heard. Central nervous system: Alert and oriented. No focal neurological deficits. Extremities: Symmetric 5 x 5 power. Skin: No rashes, lesions or ulcers    Data Reviewed: I have personally reviewed following labs and imaging studies  CBC:  Recent Labs Lab 03/23/17 2142 03/24/17 1257 03/25/17 0519 03/26/17 0354  WBC 9.3 7.1 7.2 7.4  HGB 13.7 13.0 13.3 13.6  HCT 38.9* 38.6* 39.1 39.5  MCV 90.3 92.1 90.9 90.2  PLT 221 214 215 228   Basic Metabolic Panel:  Recent Labs Lab 03/23/17 2142  NA 136  K 3.5  CL 104  CO2 22  GLUCOSE 87  BUN 22*  CREATININE 0.89  CALCIUM 8.9   GFR: Estimated Creatinine Clearance: 90.4 mL/min (by C-G formula based on SCr of 0.89  mg/dL). Liver Function Tests: No results for input(s): AST, ALT, ALKPHOS, BILITOT, PROT, ALBUMIN in the last 168 hours. No results for input(s): LIPASE, AMYLASE in the last 168 hours. No results for input(s): AMMONIA in the last 168 hours. Coagulation Profile:  Recent Labs Lab 03/23/17 2142  INR 1.01   Cardiac Enzymes:  Recent Labs Lab 03/24/17 0210 03/24/17 0718 03/24/17 1257  TROPONINI <0.03 <0.03 <0.03   BNP (last 3 results) No results for input(s): PROBNP in the last 8760 hours. HbA1C: No results for input(s): HGBA1C  in the last 72 hours. CBG: No results for input(s): GLUCAP in the last 168 hours. Lipid Profile:  Recent Labs  03/25/17 0519  CHOL 168  HDL 66  LDLCALC 90  TRIG 62  CHOLHDL 2.5   Thyroid Function Tests: No results for input(s): TSH, T4TOTAL, FREET4, T3FREE, THYROIDAB in the last 72 hours. Anemia Panel: No results for input(s): VITAMINB12, FOLATE, FERRITIN, TIBC, IRON, RETICCTPCT in the last 72 hours. Sepsis Labs: No results for input(s): PROCALCITON, LATICACIDVEN in the last 168 hours.  No results found for this or any previous visit (from the past 240 hour(s)).       Radiology Studies: No results found.      Scheduled Meds: . aspirin EC  81 mg Oral Daily  . atorvastatin  40 mg Oral q1800  . metoprolol succinate  50 mg Oral QHS  . nicotine  21 mg Transdermal Daily  . pantoprazole  40 mg Oral Daily  . sodium chloride flush  3 mL Intravenous Q12H  . sodium chloride flush  3 mL Intravenous Q12H  . umeclidinium bromide  1 puff Inhalation Daily   Continuous Infusions: . sodium chloride    . sodium chloride 1 mL/kg/hr (03/26/17 0740)  . heparin 1,500 Units/hr (03/26/17 0333)     LOS: 3 days    Dayzee Trower Jaynie Collins, MD [facility] Pager [phone]  If 7PM-7AM, please contact night-coverage [email or web address] Password [facility] 03/26/2017, 10:31 AM

## 2017-03-26 NOTE — Progress Notes (Signed)
Site area:  L Brachial  Site Prior to Removal:  Level  0  Pressure Applied For 15 Minutes   Beginning at 21:20  Manual:   Yes  Patient Status During Pull:  Stable  Post Pull Groin Site:  Level 0  Post Pull Instructions Given:  Yes  Post Pull Pulses Present:  Yes  Dressing Applied:  Yes  Comments:   Pt tolerated procedure well. VSS

## 2017-03-26 NOTE — Progress Notes (Signed)
ANTICOAGULATION CONSULT NOTE - Follow Up Consult  Pharmacy Consult for heparin Indication: chest pain/ACS  No Known Allergies  Patient Measurements: Height: 5\' 8"  (172.7 cm) Weight: 193 lb 8 oz (87.8 kg) (a scale) IBW/kg (Calculated) : 68.4 Heparin Dosing Weight: 86.1 Kg  Vital Signs: Temp: 97.7 F (36.5 C) (07/23 0526) Temp Source: Oral (07/23 0526) BP: 91/50 (07/23 0526) Pulse Rate: 54 (07/23 0526)  Labs:  Recent Labs  03/23/17 2142 03/24/17 0210  03/24/17 0718 03/24/17 1257 03/24/17 2059 03/25/17 0519 03/26/17 0354  HGB 13.7  --   --   --  13.0  --  13.3 13.6  HCT 38.9*  --   --   --  38.6*  --  39.1 39.5  PLT 221  --   --   --  214  --  215 228  APTT 32  --   --   --   --   --   --   --   LABPROT 13.3  --   --   --   --   --   --   --   INR 1.01  --   --   --   --   --   --   --   HEPARINUNFRC  --   --   < >  --  <0.10* 0.35 0.55 0.37  CREATININE 0.89  --   --   --   --   --   --   --   TROPONINI  --  <0.03  --  <0.03 <0.03  --   --   --   < > = values in this interval not displayed.  Estimated Creatinine Clearance: 90.4 mL/min (by C-G formula based on SCr of 0.89 mg/dL).   Medications:  Prescriptions Prior to Admission  Medication Sig Dispense Refill Last Dose  . albuterol (PROVENTIL HFA;VENTOLIN HFA) 108 (90 BASE) MCG/ACT inhaler Inhale 2 puffs into the lungs every 6 (six) hours as needed for wheezing or shortness of breath. 1 Inhaler 5 03/23/2017 at Unknown time  . Cholecalciferol (VITAMIN D3) 1000 units CAPS Take 1 capsule by mouth daily.    03/23/2017 at Unknown time  . esomeprazole (NEXIUM) 20 MG capsule Take 20 mg by mouth daily at 12 noon.   03/23/2017 at Unknown time  . fluticasone (FLONASE) 50 MCG/ACT nasal spray Place 1 spray into both nostrils daily.   03/23/2017 at Unknown time  . lisinopril-hydrochlorothiazide (PRINZIDE,ZESTORETIC) 20-25 MG per tablet Take 1 tablet by mouth daily. 30 tablet 2 03/23/2017 at Unknown time  . metoprolol succinate  (TOPROL-XL) 50 MG 24 hr tablet Take 1 tablet (50 mg total) by mouth daily. (Patient taking differently: Take 50 mg by mouth at bedtime. ) 30 tablet 2 03/22/2017 at 2100  . umeclidinium bromide (INCRUSE ELLIPTA) 62.5 MCG/INH AEPB Inhale 1 puff into the lungs daily.    unknown at Unknown time    Assessment: 66 [facility] for [facility] in patient with know CAD s/p CABG ~8 years ago. No anticoagulation prior to admission. Baseline CBC WNL. No bleeding or infusion issues documented.  Heparin level therapeutic CBC stable  Goal of Therapy:  Heparin level 0.3-0.7 units/ml Monitor platelets by anticoagulation protocol: Yes   Plan:  Continue heparin at 1500 units / hr Follow up after cath  Thank you Okey RegalLisa Keaghan Staton, PharmD [phone] 03/26/2017 8:41 AM

## 2017-03-26 NOTE — H&P (View-Only) (Signed)
Progress Note  Patient Name: Craig Harmon Date of Encounter: 03/26/2017  Primary Cardiologist: Dr Elease Hashimoto  Subjective   No chest pain overnight  Inpatient Medications    Scheduled Meds: . aspirin EC  81 mg Oral Daily  . atorvastatin  40 mg Oral q1800  . hydrochlorothiazide  25 mg Oral Daily  . lisinopril  20 mg Oral Daily  . metoprolol succinate  50 mg Oral QHS  . nicotine  21 mg Transdermal Daily  . pantoprazole  40 mg Oral Daily  . sodium chloride flush  3 mL Intravenous Q12H  . sodium chloride flush  3 mL Intravenous Q12H  . umeclidinium bromide  1 puff Inhalation Daily   Continuous Infusions: . sodium chloride    . sodium chloride 1 mL/kg/hr (03/26/17 0740)  . heparin 1,500 Units/hr (03/26/17 0333)   PRN Meds: sodium chloride, acetaminophen **OR** acetaminophen, albuterol, morphine injection, ondansetron **OR** ondansetron (ZOFRAN) IV, sodium chloride flush   Vital Signs    Vitals:   03/25/17 2133 03/26/17 0526 03/26/17 0533 03/26/17 0808  BP: 102/61 (!) 91/50    Pulse: 63 (!) 54    Resp: 18 14    Temp: (!) 97.5 F (36.4 C) 97.7 F (36.5 C)    TempSrc: Oral Oral    SpO2: 95% 98%  97%  Weight:   193 lb 8 oz (87.8 kg)   Height:        Intake/Output Summary (Last 24 hours) at 03/26/17 0836 Last data filed at 03/26/17 0836  Gross per 24 hour  Intake          4471.27 ml  Output             3725 ml  Net           746.27 ml   Filed Weights   03/24/17 0143 03/25/17 0536 03/26/17 0533  Weight: 192 lb 9.6 oz (87.4 kg) 193 lb 11.2 oz (87.9 kg) 193 lb 8 oz (87.8 kg)    Telemetry    NSR, PVCs - Personally Reviewed  ECG    NSR - Personally Reviewed  Physical Exam   GEN: No acute distress.   Neck: No JVD, no bruit Cardiac: RRR, 2/6 systolic murmur AOV, diminished S2, rubs, or gallops.  Extrem: decreased femoral and distal pulses, no RFA bruit noted Respiratory: Clear to auscultation bilaterally. MS: No edema; No deformity. Neuro:  Nonfocal    Psych: Normal affect   Labs    Chemistry Recent Labs Lab 03/23/17 2142  NA 136  K 3.5  CL 104  CO2 22  GLUCOSE 87  BUN 22*  CREATININE 0.89  CALCIUM 8.9  GFRNONAA >60  GFRAA >60  ANIONGAP 10     Hematology Recent Labs Lab 03/24/17 1257 03/25/17 0519 03/26/17 0354  WBC 7.1 7.2 7.4  RBC 4.19* 4.30 4.38  HGB 13.0 13.3 13.6  HCT 38.6* 39.1 39.5  MCV 92.1 90.9 90.2  MCH 31.0 30.9 31.1  MCHC 33.7 34.0 34.4  RDW 13.6 13.6 13.5  PLT 214 215 228    Cardiac Enzymes Recent Labs Lab 03/24/17 0210 03/24/17 0718 03/24/17 1257  TROPONINI <0.03 <0.03 <0.03    Recent Labs Lab 03/23/17 2204  TROPIPOC 0.00     BNPNo results for input(s): BNP, PROBNP in the last 168 hours.   DDimer No results for input(s): DDIMER in the last 168 hours.   Radiology    CXR-2V- 03/23/17-  IMPRESSION: No active cardiopulmonary disease.  Moderate-sized hiatal hernia.  Cardiac Studies   Echo 03/24/17- Study Conclusions  - Left ventricle: The cavity size was normal. Wall thickness was   increased in a pattern of mild LVH. There was focal basal   hypertrophy. Systolic function was normal. The estimated ejection   fraction was in the range of 60% to 65%. Wall motion was normal;   there were no regional wall motion abnormalities. Features are   consistent with a pseudonormal left ventricular filling pattern,   with concomitant abnormal relaxation and increased filling   pressure (grade 2 diastolic dysfunction). - Aortic valve: Mildly to moderately calcified annulus. Moderately   thickened, moderately calcified leaflets. Valve mobility was   moderately restricted. There was moderate stenosis. Valve area   (VTI): 1.14 cm^2. Valve area (Vmax): 1.05 cm^2. Valve area   (Vmean): 1.06 cm^2. - Mitral valve: Mildly calcified annulus.  Patient Profile     66 y.o. 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 Echo  showed normal LVF with moderate AS.   Assessment & Plan    1. Unstable Angina:  Known hx of CAD with [facility] about 8 years ago.  -- IV heparin -- BB, ASA, statin, ACEi -- Rt and Lt heart cath today  2. Moderate AS: documented by echo this admission  3. HCVD: normal LVF with LVH and grade 2 DD by echo. His B/P is actually running low- will hold ACE today pre cath.   4. HL:  not on statin prior to admission- LDL 90  5. Tobacco Use: cessation advised  Plan: Hold ACE today-B/P low-? If he was taking this prior to admission, pt says he was taking "two B/P medications"  Signed, Corine ShelterLuke Kilroy, PA-C  03/26/2017, 8:36 AM     Patient seen and examined. Agree with assessment and plan. No recurrent chest pain. No JVD. No rales, Harsh 2/6 systolic murmur c/w AS. For right and left heart cath today. Hold ACE-I and HCTZ today. The risks and benefits of a cardiac catheterization including, but not limited to, death, stroke, MI, kidney damage and bleeding were discussed with the patient who indicates understanding and agrees to proceed.    Lennette Biharihomas A. Kelly, MD, [facility] 03/26/2017 9:32 AM

## 2017-03-27 ENCOUNTER — Encounter (HOSPITAL_COMMUNITY): Payer: Self-pay | Admitting: Cardiovascular Disease

## 2017-03-27 DIAGNOSIS — I350 Nonrheumatic aortic (valve) stenosis: Secondary | ICD-10-CM

## 2017-03-27 DIAGNOSIS — Z955 Presence of coronary angioplasty implant and graft: Secondary | ICD-10-CM

## 2017-03-27 LAB — BASIC METABOLIC PANEL
Anion gap: 8 (ref 5–15)
BUN: 15 mg/dL (ref 6–20)
CALCIUM: 8.9 mg/dL (ref 8.9–10.3)
CO2: 26 mmol/L (ref 22–32)
CREATININE: 0.82 mg/dL (ref 0.61–1.24)
Chloride: 102 mmol/L (ref 101–111)
GFR calc Af Amer: >60 mL/min (ref >60)
GLUCOSE: 111 mg/dL — AB (ref 65–99)
Potassium: 3.4 mmol/L — ABNORMAL LOW (ref 3.5–5.1)
Sodium: 136 mmol/L (ref 135–145)

## 2017-03-27 LAB — CBC
HEMATOCRIT: 37.8 % — AB (ref 39.0–52.0)
Hemoglobin: 13.2 g/dL (ref 13.0–17.0)
MCH: 31.1 pg (ref 26.0–34.0)
MCHC: 34.9 g/dL (ref 30.0–36.0)
MCV: 88.9 fL (ref 78.0–100.0)
Platelets: 216 10*3/uL (ref 150–400)
RBC: 4.25 MIL/uL (ref 4.22–5.81)
RDW: 13.2 % (ref 11.5–15.5)
WBC: 8.1 10*3/uL (ref 4.0–10.5)

## 2017-03-27 MED ORDER — CLOPIDOGREL BISULFATE 75 MG PO TABS: 75.0000 mg | ORAL_TABLET | Freq: Every day | ORAL | 6 refills | Status: DC

## 2017-03-27 MED ORDER — ATORVASTATIN CALCIUM 40 MG PO TABS: 40.0000 mg | ORAL_TABLET | Freq: Every day | ORAL | 0 refills | Status: DC

## 2017-03-27 MED ORDER — NICOTINE 21 MG/24HR TD PT24: 21.0000 mg | MEDICATED_PATCH | Freq: Every day | TRANSDERMAL | 1 refills | Status: DC

## 2017-03-27 MED ORDER — METOPROLOL SUCCINATE ER 50 MG PO TB24: 50.0000 mg | ORAL_TABLET | Freq: Every day | ORAL | 6 refills | Status: DC

## 2017-03-27 MED ORDER — POTASSIUM CHLORIDE CRYS ER 20 MEQ PO TBCR
40.0000 meq | EXTENDED_RELEASE_TABLET | Freq: Every day | ORAL | Status: DC
  Administered 2017-03-27: 40 meq via ORAL
  Filled 2017-03-27: qty 2

## 2017-03-27 MED ORDER — ASPIRIN 81 MG PO TBEC: 81.0000 mg | DELAYED_RELEASE_TABLET | Freq: Every day | ORAL | 6 refills | Status: DC

## 2017-03-27 MED ORDER — ANGIOPLASTY BOOK
Freq: Once | Status: AC
  Administered 2017-03-27: 10:00:00
  Filled 2017-03-27: qty 1

## 2017-03-27 NOTE — Progress Notes (Signed)
Progress Note  Patient Name: Craig Harmon Date of Encounter: 03/27/2017  Primary Cardiologist: new - pt will f/u in [place]  Subjective   No chest pain or sob.  Walking without difficulty.  Eager to go home.  Inpatient Medications    Scheduled Meds: . angioplasty book   Does not apply Once  . aspirin EC  81 mg Oral Daily  . atorvastatin  40 mg Oral q1800  . clopidogrel  75 mg Oral Q breakfast  . metoprolol succinate  50 mg Oral QHS  . nicotine  21 mg Transdermal Daily  . pantoprazole  40 mg Oral Daily  . potassium chloride  40 mEq Oral Daily  . sodium chloride flush  3 mL Intravenous Q12H  . sodium chloride flush  3 mL Intravenous Q12H  . umeclidinium bromide  1 puff Inhalation Daily   Continuous Infusions: . sodium chloride     PRN Meds: sodium chloride, acetaminophen **OR** acetaminophen, albuterol, morphine injection, ondansetron **OR** ondansetron (ZOFRAN) IV, sodium chloride flush   Vital Signs    Vitals:   03/26/17 2325 03/27/17 0250 03/27/17 0410 03/27/17 0700  BP: 100/66 (!) 93/54 106/65 106/70  Pulse: 65 65 68 62  Resp: 18 15 15 19   Temp: 97.7 F (36.5 C) 98.1 F (36.7 C)  97.6 F (36.4 C)  TempSrc: Oral Oral  Oral  SpO2: 96% 95% 95% 97%  Weight:  194 lb 14.2 oz (88.4 kg)    Height:        Intake/Output Summary (Last 24 hours) at 03/27/17 0937 Last data filed at 03/27/17 0700  Gross per 24 hour  Intake          3048.04 ml  Output             1650 ml  Net          1398.04 ml   Filed Weights   03/25/17 0536 03/26/17 0533 03/27/17 0250  Weight: 193 lb 11.2 oz (87.9 kg) 193 lb 8 oz (87.8 kg) 194 lb 14.2 oz (88.4 kg)    Physical Exam   GEN: Well nourished, well developed, in no acute distress.  HEENT: Grossly normal.  Neck: Supple, no JVD, carotid bruits, or masses. Cardiac: RRR, 3/6 SEM throughout - loudest @ USB bilat, no rubs, or gallops. No clubbing, cyanosis, edema.  Radials/DP/PT 2+ and equal bilaterally. L wrist cath site mildly  swollen w/o bleeding/bruit/hematoma. Respiratory:  Respirations regular and unlabored, diminished breath sounds bilaterally. GI: Soft, nontender, nondistended, BS + x 4. MS: no deformity or atrophy. Skin: warm and dry, no rash. Neuro:  Strength and sensation are intact. Psych: AAOx3.  Normal affect.  Labs    Chemistry Recent Labs Lab 03/23/17 2142 03/27/17 0222  NA 136 136  K 3.5 3.4*  CL 104 102  CO2 22 26  GLUCOSE 87 111*  BUN 22* 15  CREATININE 0.89 0.82  CALCIUM 8.9 8.9  GFRNONAA >60 >60  GFRAA >60 >60  ANIONGAP 10 8     Hematology Recent Labs Lab 03/25/17 0519 03/26/17 0354 03/27/17 0222  WBC 7.2 7.4 8.1  RBC 4.30 4.38 4.25  HGB 13.3 13.6 13.2  HCT 39.1 39.5 37.8*  MCV 90.9 90.2 88.9  MCH 30.9 31.1 31.1  MCHC 34.0 34.4 34.9  RDW 13.6 13.5 13.2  PLT 215 228 216    Cardiac Enzymes Recent Labs Lab 03/24/17 0210 03/24/17 0718 03/24/17 1257  TROPONINI <0.03 <0.03 <0.03    Recent Labs Lab 03/23/17 2204  TROPIPOC 0.00     Radiology    No results found.  Telemetry    Rsr, sinus brady, pvc's, 4 beats nsvt - Personally Reviewed  ECG    RSR, 66, no acute st/t changes - Personally Reviewed  Cardiac Studies    Echo 03/24/17- Study Conclusions   - Left ventricle: The cavity size was normal. Wall thickness was   increased in a pattern of mild LVH. There was focal basal   hypertrophy. Systolic function was normal. The estimated ejection   fraction was in the range of 60% to 65%. Wall motion was normal;   there were no regional wall motion abnormalities. Features are   consistent with a pseudonormal left ventricular filling pattern,   with concomitant abnormal relaxation and increased filling   pressure (grade 2 diastolic dysfunction). - Aortic valve: Mildly to moderately calcified annulus. Moderately   thickened, moderately calcified leaflets. Valve mobility was   moderately restricted. There was moderate stenosis. Valve area   (VTI): 1.14  cm^2. Valve area (Vmax): 1.05 cm^2. Valve area   (Vmean): 1.06 cm^2. - Mitral valve: Mildly calcified annulus. _____________  Cardiac Catheterization 03/26/2017 1. Severe three-vessel coronary artery disease with total occlusion of the proximal LAD, total occlusion proximal RCA, and severe stenosis of the left circumflex  2. Status post aortocoronary bypass surgery with continued patency of the LIMA to LAD and total occlusion of saphenous vein graft presumably to the distal RCA and OM branches 3. 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 Moderate aortic stenosis with mean transaortic gradient of 24 mmHg, peak to peak gradient 34 mmHg, and calculated aortic valve area 0.96 cm 5. Successful PCI of the proximal left circumflex and OM branches as detailed   Recommend: Dual antiplatelet therapy with aspirin and clopidogrel for 12 months, aggressive risk reduction and tobacco cessation, continued surveillance of aortic stenosis by serial echo assessment. If no complications arise should be ready for discharge tomorrow.   Patient Profile     66 y.o. 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 Echo showed normal LVF with moderate AS.  Cath 7/23 revealed severe native LCX and OM dzs with 2/3 patent grafts, now s/p PCI DES of the LCX and CBA of the OM1.  Assessment & Plan    1.  USA/CAD:  Now s/p PCI/DES of the prox LCX and CBA of the OM1 2/2 ISR.  No chest pain or dyspnea overnight.  Ambulated with cardiac rehab this am - no issues.  L radial site looks good.  Plan d/c today.  I will arrange for f/u in our [place] office in 2 wks.  Cont asa, statin,  blocker.  Cardiac rehab @ [facility].  2.  Essential HTN:  BP stable/soft.  Cont  blocker.  Cont to hold acei/hctz in setting of soft bps.  3.  HL:  LDL 90.  Cont statin Rx.  4.  Tob Abuse:  Cessation advised.  Was smoking 1.5 ppd.   Wishes to quit and would like Rx for nicotine patch.    5.  Moderate Ao Stenosis:  Loud syst murmur with mod AS by echo.  F/u as outpt.  6.  Hypokalemia:  Supplementation ordered.  Signed, Nicolasa Duckinghristopher Berge, NP  03/27/2017, 9:38 AM   Patient seen and examined. Agree with assessment and plan. Angios reviewed in lab yesterday with Dr. Excell Seltzerooper and decision made to stent LM/Prox  Lcx and cutting balloon to distal LCX ISR. Excellent result. Moderate AS with peak to peak gradient of 34 mm Hg.  No recurrent chest pain. Feels well. L radial and brachial cath sites stable.  OK to dc today.  F/U in [place] office; will need f/u echos in future to re-assess AS progression.   Lennette Bihari, MD, [facility] 03/27/2017 10:16 AM

## 2017-03-27 NOTE — Discharge Summary (Signed)
Physician Discharge Summary  Craig AntuRichard W Raabe WUJ:[id number] DOB: 1951/03/03 DOA: 03/23/2017  PCP: Pearson GrippeKim, James, MD  Admit date: 03/23/2017 Discharge date: 03/27/2017  Admitted From:home Disposition:home  Recommendations for Outpatient Follow-up:  1. Follow up with PCP in 1-2 weeks 2. Please obtain BMP/CBC in one week  Home Health:no Equipment/Devices:no Discharge Condition:stable CODE STATUS:full code Diet recommendation:heart healthy  Brief/Interim Summary: 66 yo male with PMH of HTN, HL, GERD, Tobacco Use, and CABG 3v 8 years ago per patient presented with exertional chest pain associated with shortness of breath. Patient initially presented to [facility] and [facility] for the evaluation of unstable angina.  # chest pain/unstable angina: -Admitted with exertional chest pain. Treated with IV heparin. Evaluated by cardiologist and patient underwent cardiac cath. Patient with history of CABG in the past. Patient had angioplasty and coronary stent placed. Recommended aspirin and Plavix for a year. I discussed with the patient regarding importance of continuing medications. Current medications adjusted. Also on a statin. He denied chest pain, shortness of breath, nausea or vomiting. Discussed with the cardiologist today. Outpatient follow-up arranged.   #Aortic stenosis/aortic insufficiency: Patient has heart murmur. Echocardiogram with moderate aortic stenosis. Also with a grade 2 diastolic dysfunction. Recommended repeat echo as an outpatient and follow-up with cardiologist.  #Hypertension: Monitor blood pressure.  #Dyslipidemia: Continue statin. LDL 90.  #Tobacco dependence: Patient was educated on smoking cessation. On nicotine patch.   Discharge Diagnoses:  Principal Problem:   Unstable angina (HCC) Active Problems:   HTN (hypertension)   COPD (chronic obstructive pulmonary disease) (HCC)   HLD (hyperlipidemia)   Tobacco use disorder   Malnutrition of moderate  degree   Nonrheumatic aortic valve stenosis   Status post coronary artery stent placement    Discharge Instructions  Discharge Instructions    Amb Referral to Cardiac Rehabilitation    Complete by:  As directed    Diagnosis:   Coronary Stents PTCA     Call MD for:  difficulty breathing, headache or visual disturbances    Complete by:  As directed    Call MD for:  extreme fatigue    Complete by:  As directed    Call MD for:  hives    Complete by:  As directed    Call MD for:  persistant dizziness or light-headedness    Complete by:  As directed    Call MD for:  persistant nausea and vomiting    Complete by:  As directed    Call MD for:  severe uncontrolled pain    Complete by:  As directed    Call MD for:  temperature >100.4    Complete by:  As directed    Diet - low sodium heart healthy    Complete by:  As directed    Increase activity slowly    Complete by:  As directed      Allergies as of 03/27/2017   No Known Allergies     Medication List    STOP taking these medications   lisinopril-hydrochlorothiazide 20-25 MG tablet Commonly known as:  PRINZIDE,ZESTORETIC     TAKE these medications   albuterol 108 (90 Base) MCG/ACT inhaler Commonly known as:  PROVENTIL HFA;VENTOLIN HFA Inhale 2 puffs into the lungs every 6 (six) hours as needed for wheezing or shortness of breath.   aspirin 81 MG EC tablet Take 1 tablet (81 mg total) by mouth daily.   atorvastatin 40 MG tablet Commonly known as:  LIPITOR Take 1 tablet (40  mg total) by mouth daily at 6 PM.   clopidogrel 75 MG tablet Commonly known as:  PLAVIX Take 1 tablet (75 mg total) by mouth daily with breakfast.   esomeprazole 20 MG capsule Commonly known as:  NEXIUM Take 20 mg by mouth daily at 12 noon.   fluticasone 50 MCG/ACT nasal spray Commonly known as:  FLONASE Place 1 spray into both nostrils daily.   metoprolol succinate 50 MG 24 hr tablet Commonly known as:  TOPROL-XL Take 1 tablet (50 mg  total) by mouth at bedtime.   nicotine 21 mg/24hr patch Commonly known as:  NICODERM CQ - dosed in mg/24 hours Place 1 patch (21 mg total) onto the skin daily.   umeclidinium bromide 62.5 MCG/INH Aepb Commonly known as:  INCRUSE ELLIPTA Inhale 1 puff into the lungs daily.   Vitamin D3 1000 units Caps Take 1 capsule by mouth daily.      Follow-up Information    Dyann Kief, PA-C Follow up on 04/18/2017.   Specialty:  Cardiology Why:  11:00 AM Contact information: [address] [place] [address] [phone]        Pearson Grippe, MD. Schedule an appointment as soon as possible for a visit in 1 week(s).   Specialty:  Internal Medicine Contact information: [address] [place] [address] [phone]          No Known Allergies  Consultations: Cardiology  Procedures/Studies: Cardiac cath Echo  Subjective: Seen and examined at bedside. Denied headache, dizziness, nausea vomiting chest pain or shortness of breath. He reported feeling good.  Discharge Exam: Vitals:   03/27/17 0700 03/27/17 1057  BP: 106/70 132/76  Pulse: 62 66  Resp: 19 19  Temp: 97.6 F (36.4 C) 98 F (36.7 C)   Vitals:   03/27/17 0250 03/27/17 0410 03/27/17 0700 03/27/17 1057  BP: (!) 93/54 106/65 106/70 132/76  Pulse: 65 68 62 66  Resp: 15 15 19 19   Temp: 98.1 F (36.7 C)  97.6 F (36.4 C) 98 F (36.7 C)  TempSrc: Oral  Oral Oral  SpO2: 95% 95% 97% 98%  Weight: 88.4 kg (194 lb 14.2 oz)     Height:        General: Pt is alert, awake, not in acute distress Cardiovascular: RRR, S1/S2 +, no rubs, no gallops Respiratory: CTA bilaterally, no wheezing, no rhonchi Abdominal: Soft, NT, ND, bowel sounds + Extremities: no edema, no cyanosis    The results of significant diagnostics from this hospitalization (including imaging, microbiology, ancillary and laboratory) are listed below for reference.     Microbiology: No results found for this or any  previous visit (from the past 240 hour(s)).   Labs: BNP (last 3 results) No results for input(s): BNP in the last 8760 hours. Basic Metabolic Panel:  Recent Labs Lab 03/23/17 2142 03/27/17 0222  NA 136 136  K 3.5 3.4*  CL 104 102  CO2 22 26  GLUCOSE 87 111*  BUN 22* 15  CREATININE 0.89 0.82  CALCIUM 8.9 8.9   Liver Function Tests: No results for input(s): AST, ALT, ALKPHOS, BILITOT, PROT, ALBUMIN in the last 168 hours. No results for input(s): LIPASE, AMYLASE in the last 168 hours. No results for input(s): AMMONIA in the last 168 hours. CBC:  Recent Labs Lab 03/23/17 2142 03/24/17 1257 03/25/17 0519 03/26/17 0354 03/27/17 0222  WBC 9.3 7.1 7.2 7.4 8.1  HGB 13.7 13.0 13.3 13.6 13.2  HCT 38.9* 38.6* 39.1 39.5 37.8*  MCV 90.3 92.1 90.9 90.2 88.9  PLT 221 214 215 228 216   Cardiac Enzymes:  Recent Labs Lab 03/24/17 0210 03/24/17 0718 03/24/17 1257  TROPONINI <0.03 <0.03 <0.03   BNP: Invalid input(s): POCBNP CBG: No results for input(s): GLUCAP in the last 168 hours. D-Dimer No results for input(s): DDIMER in the last 72 hours. Hgb A1c No results for input(s): HGBA1C in the last 72 hours. Lipid Profile  Recent Labs  03/25/17 0519  CHOL 168  HDL 66  LDLCALC 90  TRIG 62  CHOLHDL 2.5   Thyroid function studies No results for input(s): TSH, T4TOTAL, T3FREE, THYROIDAB in the last 72 hours.  Invalid input(s): FREET3 Anemia work up No results for input(s): VITAMINB12, FOLATE, FERRITIN, TIBC, IRON, RETICCTPCT in the last 72 hours. Urinalysis No results found for: COLORURINE, APPEARANCEUR, LABSPEC, PHURINE, GLUCOSEU, HGBUR, BILIRUBINUR, KETONESUR, PROTEINUR, UROBILINOGEN, NITRITE, LEUKOCYTESUR Sepsis Labs Invalid input(s): PROCALCITONIN,  WBC,  LACTICIDVEN Microbiology No results found for this or any previous visit (from the past 240 hour(s)).   Time coordinating discharge: 30 minutes  SIGNED:   Maxie Barb, MD  [facility] 03/27/2017, 11:26 AM  If 7PM-7AM, please contact night-coverage [email or web address] Password [email or web address]

## 2017-03-27 NOTE — Progress Notes (Signed)
CARDIAC REHAB PHASE I   MODE:  Ambulation: 500 ft   POST:  Rate/Rhythm: 63 SR  BP:  Sitting: 111/66         SaO2: 97 RA  Pt ambulated 500 ft independently, no complaints. Completed PCI/stent education with pt and significant other at bedside.  Reviewed risk factors, tobacco cessation (gave pt fake cigarette), anti-platelet therapy, stent card, activity restrictions, ntg, exercise, heart healthy diet and phase 2 cardiac rehab. Pt verbalized understanding, receptive to education. Pt requesting prescription for nicotine patch, RN aware. Pt agrees to phase 2 cardiac rehab referral, will send to [place] per pt request. Pt up ad lib in room, hopeful for discharge.   [id number] Joylene GrapesEmily C Rosibel Giacobbe, RN, BSN 03/27/2017 9:22 AM

## 2017-03-27 NOTE — Care Management Note (Signed)
Case Management Note  Patient Details  Name: Craig Harmon MRN: [id number] Date of Birth: 1951-02-17  Subjective/Objective:   From home, s/p Coronary Stent Intervention , will be on plavix.              Action/Plan: NCM will follow for dc needs.   Expected Discharge Date:                  Expected Discharge Plan:  Home/Self Care  In-House Referral:     Discharge planning Services  CM Consult  Post Acute Care Choice:    Choice offered to:     DME Arranged:    DME Agency:     HH Arranged:    HH Agency:     Status of Service:  Completed, signed off  If discussed at [employer] Length of Stay Meetings, dates discussed:    Additional Comments:  Leone Havenaylor, Nathaly Dawkins [place], RN 03/27/2017, 7:51 AM

## 2017-03-27 NOTE — Progress Notes (Signed)
TR BAND REMOVAL  LOCATION:    L radial  DEFLATED PER PROTOCOL:    Yes  TIME BAND OFF / DRESSING APPLIED:   23:25  SITE UPON ARRIVAL:    Level 0  SITE AFTER BAND REMOVAL:    Level  0  CIRCULATION SENSATION AND MOVEMENT:    Within Normal Limits   Yes  COMMENTS: Pt tolerated procedure well. VSS Post removal instructions reviewed with pt and his wife.

## 2017-03-27 NOTE — Progress Notes (Signed)
Pt and family members(sisters) given all discharge instructions and questions were discussed and pt and fm's verbalized understanding. Pt is aware that prescriptions have been called into his pharmacy and pt knows where to pick them up. Pt's lt radial and lt brachial sites are level 0 and pt is not having any type of pain.  Pt discharged via wc home with family.

## 2017-03-28 LAB — HIV ANTIBODY (ROUTINE TESTING W REFLEX): HIV Screen 4th Generation wRfx: NONREACTIVE

## 2017-04-17 ENCOUNTER — Ambulatory Visit: Payer: Medicare Other | Admitting: Orthopaedic Surgery

## 2017-04-17 ENCOUNTER — Encounter: Payer: Self-pay | Admitting: Orthopaedic Surgery

## 2017-04-17 NOTE — Progress Notes (Deleted)
Cardiology Office Note    Date:  04/17/2017   ID:  Craig Harmon, DOB December 19, 1950, MRN [id number]  PCP:  Pearson GrippeKim, James, MD  Cardiologist: new seen in hospital  No chief complaint on file.   History of Present Illness:  Craig Harmon is a 66 y.o. male with history of CAD status post CABG [facility] years ago at [facility]. He was admitted 03/24/17 with chest pain consistent with unstable angina and troponins were negative. 2-D echo showed normal LVEF with moderate aortic stenosis. Cardiac cath 03/26/17 revealed severe native circumflex and OM disease with 2 of 3 patent grafts status post DES to the circumflex and CBA of OM1. Smoking cessation recommended.    Past Medical History:  Diagnosis Date  . Arthritis   . CAD (coronary artery disease)    a. s/p prior CABG x 3 9LIMA->LAD, VG->RPDA, VG->OM;  b. s/p prior stenting of OM1;  c. 03/2017 Cath/PCI: LAD 100, LCX 90p (3.5x20 Synergy DES), OM1 90 ISR (CBA), RCA 100p, LIMA->LAD ok, VG->OM 100, VG->RPDA 100.  Marland Kitchen. GERD (gastroesophageal reflux disease)   . HLD (hyperlipidemia)   . HTN (hypertension)   . Moderate aortic stenosis    a. 03/2017 Echo: EF 60-65%, Gr2 DD, mod AS.  . Tobacco abuse     Past Surgical History:  Procedure Laterality Date  . BACK SURGERY    . CORONARY ARTERY BYPASS GRAFT  2008  . CORONARY BALLOON ANGIOPLASTY N/A 03/26/2017   Procedure: Coronary Balloon Angioplasty;  Surgeon: Tonny Bollmanooper, Michael, MD;  [facility];  Service: Cardiovascular;  Laterality: N/A;  . CORONARY STENT INTERVENTION N/A 03/26/2017   Procedure: Coronary Stent Intervention;  Surgeon: Tonny Bollmanooper, Michael, MD;  [facility];  Service: Cardiovascular;  Laterality: N/A;  . RIGHT/LEFT HEART CATH AND CORONARY/GRAFT ANGIOGRAPHY N/A 03/26/2017   Procedure: Right/Left Heart Cath and Coronary/Graft Angiography;  Surgeon: Tonny Bollmanooper, Michael, MD;  [facility];  Service: Cardiovascular;  Laterality: N/A;    Current Medications: No  outpatient prescriptions have been marked as taking for the 04/18/17 encounter (Appointment) with Dyann KiefLenze, Cela Newcom M, PA-C.     Allergies:   Patient has no known allergies.   Social History   Social History  . Marital status: Divorced    Spouse name: N/A  . Number of children: N/A  . Years of education: N/A   Social History Main Topics  . Smoking status: Current Every Day Smoker    Packs/day: 1.00    Years: 45.00    Types: Cigarettes  . Smokeless tobacco: Never Used  . Alcohol use Yes     Comment: occasionally  . Drug use: No  . Sexual activity: Not on file   Other Topics Concern  . Not on file   Social History Narrative  . No narrative on file     Family History:  The patient's ***family history includes Alcohol abuse in his father; CAD in his brother; Heart attack in his mother.   ROS:   Please see the history of present illness.    ROS All other systems reviewed and are negative.   PHYSICAL EXAM:   VS:  There were no vitals taken for this visit.  Physical Exam  GEN: Well nourished, well developed, in no acute distress  HEENT: normal  Neck: no JVD, carotid bruits, or masses Cardiac:RRR; no murmurs, rubs, or gallops  Respiratory:  clear to auscultation bilaterally, normal work of breathing GI: soft, nontender, nondistended, + BS Ext: without cyanosis,  clubbing, or edema, Good distal pulses bilaterally MS: no deformity or atrophy  Skin: warm and dry, no rash Neuro:  Alert and Oriented x 3, Strength and sensation are intact Psych: euthymic mood, full affect  Wt Readings from Last 3 Encounters:  03/27/17 194 lb 14.2 oz (88.4 kg)  03/20/17 195 lb (88.5 kg)  01/17/17 194 lb (88 kg)      Studies/Labs Reviewed:   EKG:  EKG is*** ordered today.  The ekg ordered today demonstrates ***  Recent Labs: 03/27/2017: BUN 15; Creatinine, Ser 0.82; Hemoglobin 13.2; Platelets 216; Potassium 3.4; Sodium 136   Lipid Panel    Component Value Date/Time   CHOL 168  03/25/2017 0519   TRIG 62 03/25/2017 0519   HDL 66 03/25/2017 0519   CHOLHDL 2.5 03/25/2017 0519   VLDL 12 03/25/2017 0519   LDLCALC 90 03/25/2017 0519    Additional studies/ records that were reviewed today include:  Cardiac catheterization 7/23/18Conclusion  1. Severe three-vessel coronary artery disease with total occlusion of the proximal LAD, total occlusion proximal RCA, and severe stenosis of the left circumflex  2. Status post aortocoronary bypass surgery with continued patency of the LIMA to LAD and total occlusion of saphenous vein graft presumably to the distal RCA and OM branches 3. 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 Moderate aortic stenosis with mean transaortic gradient of 24 mmHg, peak to peak gradient 34 mmHg, and calculated aortic valve area 0.96 cm 5. Successful PCI of the proximal left circumflex and OM branches as detailed   Recommend: Dual antiplatelet therapy with aspirin and clopidogrel for 12 months, aggressive risk reduction and tobacco cessation, continued surveillance of aortic stenosis by serial echo assessment. If no complications arise should be ready for discharge tomorrow.     Conclusion  1. Severe three-vessel coronary artery disease with total occlusion of the proximal LAD, total occlusion proximal RCA, and severe stenosis of the left circumflex  2. Status post aortocoronary bypass surgery with continued patency of the LIMA to LAD and total occlusion of saphenous vein graft presumably to the distal RCA and OM branches 3. 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 Moderate aortic stenosis with mean transaortic gradient of 24 mmHg, peak to peak gradient 34 mmHg, and calculated aortic valve area 0.96 cm 5. Successful PCI of the proximal left circumflex and OM branches as detailed   Recommend: Dual antiplatelet therapy with aspirin and clopidogrel for 12 months, aggressive  risk reduction and tobacco cessation, continued surveillance of aortic stenosis by serial echo assessment. If no complications arise should be ready for discharge tomorrow.    Echo 03/24/17- Study Conclusions   - Left ventricle: The cavity size was normal. Wall thickness was   increased in a pattern of mild LVH. There was focal basal   hypertrophy. Systolic function was normal. The estimated ejection   fraction was in the range of 60% to 65%. Wall motion was normal;   there were no regional wall motion abnormalities. Features are   consistent with a pseudonormal left ventricular filling pattern,   with concomitant abnormal relaxation and increased filling   pressure (grade 2 diastolic dysfunction). - Aortic valve: Mildly to moderately calcified annulus. Moderately   thickened, moderately calcified leaflets. Valve mobility was   moderately restricted. There was moderate stenosis. Valve area   (VTI): 1.14 cm^2. Valve area (Vmax): 1.05 cm^2. Valve area   (Vmean): 1.06 cm^2. - Mitral valve: Mildly  calcified annulus. _____________       ASSESSMENT:    1. 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 Essential hypertension   3. Nonrheumatic aortic valve stenosis   4. Mixed hyperlipidemia   5. Tobacco use disorder      PLAN:  In order of problems listed above:      Medication Adjustments/Labs and Tests Ordered: Current medicines are reviewed at length with the patient today.  Concerns regarding medicines are outlined above.  Medication changes, Labs and Tests ordered today are listed in the Patient Instructions below. There are no Patient Instructions on file for this visit.   Elson Clan, PA-C  04/17/2017 3:09 PM    [facility] [address] [place], [place], [place]  [address] Phone: [phone]; Fax: [phone]

## 2017-04-18 ENCOUNTER — Ambulatory Visit: Payer: Medicare Other | Admitting: Physician Assistant

## 2017-04-18 ENCOUNTER — Encounter: Payer: Self-pay | Admitting: Physician Assistant

## 2017-06-03 ENCOUNTER — Emergency Department (HOSPITAL_COMMUNITY)
Admission: EM | Admit: 2017-06-03 | Discharge: 2017-06-03 | Disposition: A | Discharge status: Home / Self Care | Payer: Medicare Other | Attending: Emergency Medicine | Admitting: Emergency Medicine

## 2017-06-03 ENCOUNTER — Encounter (HOSPITAL_COMMUNITY): Payer: Self-pay

## 2017-06-03 DIAGNOSIS — Z7982 Long term (current) use of aspirin: Secondary | ICD-10-CM | POA: Insufficient documentation

## 2017-06-03 DIAGNOSIS — J449 Chronic obstructive pulmonary disease, unspecified: Secondary | ICD-10-CM | POA: Diagnosis not present

## 2017-06-03 DIAGNOSIS — E785 Hyperlipidemia, unspecified: Secondary | ICD-10-CM | POA: Insufficient documentation

## 2017-06-03 DIAGNOSIS — I2510 Atherosclerotic heart disease of native coronary artery without angina pectoris: Secondary | ICD-10-CM | POA: Insufficient documentation

## 2017-06-03 DIAGNOSIS — Z79899 Other long term (current) drug therapy: Secondary | ICD-10-CM | POA: Insufficient documentation

## 2017-06-03 DIAGNOSIS — K1329 Other disturbances of oral epithelium, including tongue: Secondary | ICD-10-CM | POA: Insufficient documentation

## 2017-06-03 DIAGNOSIS — R200 Anesthesia of skin: Secondary | ICD-10-CM

## 2017-06-03 DIAGNOSIS — F17210 Nicotine dependence, cigarettes, uncomplicated: Secondary | ICD-10-CM | POA: Insufficient documentation

## 2017-06-03 DIAGNOSIS — I10 Essential (primary) hypertension: Secondary | ICD-10-CM | POA: Insufficient documentation

## 2017-06-03 NOTE — ED Triage Notes (Signed)
Reports of approx. 1.5 week ago patient was spraying for bug and possibly inhaled chemical. Reports of tingling and numbness to corners or mouth and tongue. Denies respiratory issues.

## 2017-06-03 NOTE — ED Provider Notes (Signed)
[facility]-EMERGENCY DEPT Provider Note   CSN: [id number] Arrival date & time: 06/03/17  1122     History   Chief Complaint Chief Complaint  Patient presents with  . Tingling    HPI Craig Harmon is a 66 y.o. male.  HPI  66 year old male presents with intermittent tongue tingling. He states that it is in the very middle of his tongue. Has been ongoing for about one week. He states recently he has been applying bug spray to his trailer. Most recently did this about a week and a half ago. Has not reapplied since. He states that the tingling seems to come and go. [employer] and [employer] different to him than normal. He has noticed any swelling.He does not know why exactly the tongue becomes tingly but it does seem to come and go. He has not noticed any swelling or unilateral symptoms. There is facial swelling or numbness/tingling.  Past Medical History:  Diagnosis Date  . Arthritis   . CAD (coronary artery disease)    a. s/p prior CABG x 3 9LIMA->LAD, VG->RPDA, VG->OM;  b. s/p prior stenting of OM1;  c. 03/2017 Cath/PCI: LAD 100, LCX 90p (3.5x20 Synergy DES), OM1 90 ISR (CBA), RCA 100p, LIMA->LAD ok, VG->OM 100, VG->RPDA 100.  Marland Kitchen GERD (gastroesophageal reflux disease)   . HLD (hyperlipidemia)   . HTN (hypertension)   . Moderate aortic stenosis    a. 03/2017 Echo: EF 60-65%, Gr2 DD, mod AS.  . Tobacco abuse     Patient Active Problem List   Diagnosis Date Noted  . Nonrheumatic aortic valve stenosis   . Status post coronary artery stent placement   . Malnutrition of moderate degree 03/24/2017  . Unstable angina (HCC) 03/23/2017  . Fall at home 12/24/2013  . Left hip pain 12/24/2013  . Back pain, lumbosacral 12/24/2013  . Loss of weight 12/24/2013  . Right ear pain 12/24/2013  . Lump in neck 12/24/2013  . Tobacco abuse 12/24/2013  . Hearing loss in right ear 12/10/2013  . Ear drainage right 12/10/2013  . Chronic left hip pain 12/10/2013  . Otitis externa of right ear  12/10/2013  . Submandibular sialoadenitis 10/09/2013  . Mass of right submandibular region 10/09/2013  . COPD bronchitis 10/09/2013  . Difficulty swallowing 10/09/2013  . Unintentional weight loss of more than 10 pounds in 90 days 10/09/2013  . 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 HTN (hypertension) 06/24/2013  . COPD (chronic obstructive pulmonary disease) (HCC) 06/24/2013  . CAD (coronary artery disease), bypass graft transplanted heart 06/24/2013  . HLD (hyperlipidemia) 06/24/2013  . Tobacco use disorder 06/24/2013    Past Surgical History:  Procedure Laterality Date  . BACK SURGERY    . CORONARY ARTERY BYPASS GRAFT  2008  . CORONARY BALLOON ANGIOPLASTY N/A 03/26/2017   Procedure: Coronary Balloon Angioplasty;  Surgeon: Tonny Bollman, MD;  Location: [facility] INVASIVE CV LAB;  Service: Cardiovascular;  Laterality: N/A;  . CORONARY STENT INTERVENTION N/A 03/26/2017   Procedure: Coronary Stent Intervention;  Surgeon: Tonny Bollman, MD;  Location: [facility] INVASIVE CV LAB;  Service: Cardiovascular;  Laterality: N/A;  . RIGHT/LEFT HEART CATH AND CORONARY/GRAFT ANGIOGRAPHY N/A 03/26/2017   Procedure: Right/Left Heart Cath and Coronary/Graft Angiography;  Surgeon: Tonny Bollman, MD;  Location: [facility] INVASIVE CV LAB;  Service: Cardiovascular;  Laterality: N/A;       Home Medications    Prior to Admission medications   Medication Sig Start Date End Date Taking? Authorizing Provider  albuterol (PROVENTIL HFA;VENTOLIN HFA) 108 (90  BASE) MCG/ACT inhaler Inhale 2 puffs into the lungs every 6 (six) hours as needed for wheezing or shortness of breath. 10/09/13   Kela Millin, MD  aspirin EC 81 MG EC tablet Take 1 tablet (81 mg total) by mouth daily. 03/28/17   Ok Anis, NP  atorvastatin (LIPITOR) 40 MG tablet Take 1 tablet (40 mg total) by mouth daily at 6 PM. 03/27/17   Maxie Barb, MD  Cholecalciferol (VITAMIN D3) 1000 units CAPS Take 1 capsule by mouth daily.     [provider]  clopidogrel (PLAVIX) 75 MG tablet Take 1 tablet (75 mg total) by mouth daily with breakfast. 03/28/17   Ok Anis, NP  esomeprazole (NEXIUM) 20 MG capsule Take 20 mg by mouth daily at 12 noon.    [provider]  fluticasone (FLONASE) 50 MCG/ACT nasal spray Place 1 spray into both nostrils daily.    [provider]  metoprolol succinate (TOPROL-XL) 50 MG 24 hr tablet Take 1 tablet (50 mg total) by mouth at bedtime. 03/27/17   Ok Anis, NP  nicotine (NICODERM CQ - DOSED IN MG/24 HOURS) 21 mg/24hr patch Place 1 patch (21 mg total) onto the skin daily. 03/28/17   Ok Anis, NP  umeclidinium bromide (INCRUSE ELLIPTA) 62.5 MCG/INH AEPB Inhale 1 puff into the lungs daily.  08/12/15   [provider]    Family History Family History  Problem Relation Age of Onset  . Heart attack Mother   . Alcohol abuse Father   . CAD Brother     Social History Social History  Substance Use Topics  . Smoking status: Current Every Day Smoker    Packs/day: 1.00    Years: 45.00    Types: Cigarettes  . Smokeless tobacco: Never Used  . Alcohol use Yes     Comment: occasionally     Allergies   Patient has no known allergies.   Review of Systems Review of Systems  Gastrointestinal: Negative for nausea and vomiting.  Neurological: Positive for numbness. Negative for dizziness, weakness and headaches.     Physical Exam Updated Vital Signs BP 117/83 (BP Location: Right Arm)   Pulse 68   Temp 98 F (36.7 C) (Oral)   Resp 16   Ht  (1.727 m)   Wt 89.8 kg (198 lb)   SpO2 98%   BMI 30.11 kg/m   Physical Exam  Constitutional: He is oriented to person, place, and time. He appears well-developed and well-nourished.  HENT:  Head: Normocephalic and atraumatic.  Right Ear: External ear normal.  Left Ear: External ear normal.  Nose: Nose normal.  Mouth/Throat: Oropharynx is clear and moist. No oropharyngeal exudate.    Normal-appearing tongue, no swelling noted. He has normal sensation over his tongue diffusely. Normal movement. No oral swelling.  Eyes: Right eye exhibits no discharge. Left eye exhibits no discharge.  Neck: Neck supple.  Cardiovascular: Normal rate and regular rhythm.   Murmur heard. Pulmonary/Chest: Effort normal and breath sounds normal.  Abdominal: Soft. There is no tenderness.  Musculoskeletal: He exhibits no edema.  Neurological: He is alert and oriented to person, place, and time.  CN 3-12 grossly intact. 5/5 strength in all 4 extremities. Grossly normal sensation. Normal finger to nose.   Skin: Skin is warm and dry.  Nursing note and vitals reviewed.    ED Treatments / Results  Labs (all labs ordered are listed, but only abnormal results are displayed) Labs Reviewed - No  data to display  EKG  EKG Interpretation None       Radiology No results found.  Procedures Procedures (including critical care time)  Medications Ordered in ED Medications - No data to display   Initial Impression / Assessment and Plan / ED Course  I have reviewed the triage vital signs and the nursing notes.  Pertinent labs & imaging results that were available during my care of the patient were reviewed by me and considered in my medical decision making (see chart for details).     Unclear why patient is having intermittent tingling of his tongue. While it originally could've been from contact and possibly inhalation of the bug spray, this has not been present for the last week or so. Given he has been ventilating his home I doubt he is getting reexposed. He also has no other symptoms that would correlate with systemic toxicity. Thus he will be referred to his primary care doctor. The rest of his neuro exam is unremarkable. His symptoms are in the midline and highly unlikely to be stroke or acute CNS pathology.  Final Clinical Impressions(s) / ED Diagnoses   Final diagnoses:  Numbness of  tongue    New Prescriptions Discharge Medication List as of 06/03/2017 12:18 PM       Pricilla Loveless, MD 06/03/17 1531

## 2017-06-25 ENCOUNTER — Encounter (HOSPITAL_COMMUNITY): Admission: RE | Admit: 2017-06-25 | Payer: Medicare Other | Source: Ambulatory Visit

## 2017-07-17 ENCOUNTER — Ambulatory Visit: Payer: Medicare Other | Admitting: Orthopaedic Surgery

## 2017-07-17 ENCOUNTER — Encounter: Payer: Self-pay | Admitting: Orthopaedic Surgery

## 2017-07-22 ENCOUNTER — Other Ambulatory Visit: Payer: Self-pay

## 2017-07-22 ENCOUNTER — Encounter (HOSPITAL_COMMUNITY): Payer: Self-pay | Admitting: Emergency Medicine

## 2017-07-22 ENCOUNTER — Emergency Department (HOSPITAL_COMMUNITY): Payer: Medicare Other

## 2017-07-22 ENCOUNTER — Emergency Department (HOSPITAL_COMMUNITY)
Admission: EM | Admit: 2017-07-22 | Discharge: 2017-07-22 | Disposition: A | Discharge status: Home / Self Care | Payer: Medicare Other | Attending: Emergency Medicine | Admitting: Emergency Medicine

## 2017-07-22 DIAGNOSIS — T148XXA Other injury of unspecified body region, initial encounter: Secondary | ICD-10-CM

## 2017-07-22 DIAGNOSIS — R222 Localized swelling, mass and lump, trunk: Secondary | ICD-10-CM | POA: Diagnosis present

## 2017-07-22 DIAGNOSIS — Z79899 Other long term (current) drug therapy: Secondary | ICD-10-CM | POA: Diagnosis not present

## 2017-07-22 DIAGNOSIS — F17210 Nicotine dependence, cigarettes, uncomplicated: Secondary | ICD-10-CM | POA: Insufficient documentation

## 2017-07-22 DIAGNOSIS — I2510 Atherosclerotic heart disease of native coronary artery without angina pectoris: Secondary | ICD-10-CM | POA: Diagnosis not present

## 2017-07-22 DIAGNOSIS — Z7982 Long term (current) use of aspirin: Secondary | ICD-10-CM | POA: Diagnosis not present

## 2017-07-22 DIAGNOSIS — J449 Chronic obstructive pulmonary disease, unspecified: Secondary | ICD-10-CM | POA: Diagnosis not present

## 2017-07-22 DIAGNOSIS — Z955 Presence of coronary angioplasty implant and graft: Secondary | ICD-10-CM | POA: Diagnosis not present

## 2017-07-22 DIAGNOSIS — L03317 Cellulitis of buttock: Secondary | ICD-10-CM | POA: Insufficient documentation

## 2017-07-22 DIAGNOSIS — L0390 Cellulitis, unspecified: Secondary | ICD-10-CM

## 2017-07-22 DIAGNOSIS — I10 Essential (primary) hypertension: Secondary | ICD-10-CM | POA: Diagnosis not present

## 2017-07-22 LAB — CBC WITH DIFFERENTIAL/PLATELET
BASOS ABS: 0 10*3/uL (ref 0.0–0.1)
BASOS PCT: 0 %
EOS ABS: 0.1 10*3/uL (ref 0.0–0.7)
EOS PCT: 1 %
HEMATOCRIT: 41.5 % (ref 39.0–52.0)
Hemoglobin: 14.2 g/dL (ref 13.0–17.0)
Lymphocytes Relative: 28 %
Lymphs Abs: 1.8 10*3/uL (ref 0.7–4.0)
MCH: 30.5 pg (ref 26.0–34.0)
MCHC: 34.2 g/dL (ref 30.0–36.0)
MCV: 89.1 fL (ref 78.0–100.0)
MONO ABS: 0.7 10*3/uL (ref 0.1–1.0)
MONOS PCT: 11 %
NEUTROS ABS: 4.1 10*3/uL (ref 1.7–7.7)
Neutrophils Relative %: 60 %
PLATELETS: 254 10*3/uL (ref 150–400)
RBC: 4.66 MIL/uL (ref 4.22–5.81)
RDW: 13.7 % (ref 11.5–15.5)
WBC: 6.7 10*3/uL (ref 4.0–10.5)

## 2017-07-22 LAB — I-STAT CHEM 8, ED
BUN: 22 mg/dL — AB (ref 6–20)
CALCIUM ION: 1.16 mmol/L (ref 1.15–1.40)
CREATININE: 1 mg/dL (ref 0.61–1.24)
Chloride: 107 mmol/L (ref 101–111)
GLUCOSE: 93 mg/dL (ref 65–99)
HCT: 39 % (ref 39.0–52.0)
HEMOGLOBIN: 13.3 g/dL (ref 13.0–17.0)
Potassium: 4.1 mmol/L (ref 3.5–5.1)
Sodium: 138 mmol/L (ref 135–145)
TCO2: 22 mmol/L (ref 22–32)

## 2017-07-22 MED ORDER — IOPAMIDOL (ISOVUE-300) INJECTION 61%
100.0000 mL | Freq: Once | INTRAVENOUS | Status: AC | PRN
  Administered 2017-07-22: 100 mL via INTRAVENOUS

## 2017-07-22 MED ORDER — ACETAMINOPHEN 325 MG PO TABS
650.0000 mg | ORAL_TABLET | Freq: Once | ORAL | Status: AC
  Administered 2017-07-22: 650 mg via ORAL
  Filled 2017-07-22: qty 2

## 2017-07-22 MED ORDER — BACITRACIN ZINC 500 UNIT/GM EX OINT
TOPICAL_OINTMENT | Freq: Once | CUTANEOUS | Status: AC
  Administered 2017-07-22: 1 via TOPICAL
  Filled 2017-07-22: qty 0.9

## 2017-07-22 MED ORDER — CEPHALEXIN 500 MG PO CAPS: 500.0000 mg | ORAL_CAPSULE | Freq: Four times a day (QID) | ORAL | 0 refills | Status: DC

## 2017-07-22 NOTE — ED Notes (Signed)
Pt to CT

## 2017-07-22 NOTE — ED Provider Notes (Signed)
[facility] EMERGENCY DEPARTMENT Provider Note   CSN: [id number] Arrival date & time: 07/22/17  1134     History   Chief Complaint Chief Complaint  Patient presents with  . Abscess    HPI Craig Harmon is a 66 y.o. male 3 weeks of pain and swelling noted to the pilonidal/buttock area.  Patient reports that symptoms have progressively worsened over the last 3 weeks.  Patient reports difficulty sitting down secondary to pain. He states that whenever he sits down it is very painful to the pilonidal area.  He also reports noticing some superficial abrasions to the area.  He has not tried any medication for the symptoms.  Patient denies any purulent drainage from the area.  Patient also reports two small bumps to the left thigh. Unsure of when they appeared. Patient reports some very mild associated pain. Patient denies any fevers, chills, difficulty with bowel movements.  The history is provided by the patient.    Past Medical History:  Diagnosis Date  . Arthritis   . CAD (coronary artery disease)    a. s/p prior CABG x 3 9LIMA->LAD, VG->RPDA, VG->OM;  b. s/p prior stenting of OM1;  c. 03/2017 Cath/PCI: LAD 100, LCX 90p (3.5x20 Synergy DES), OM1 90 ISR (CBA), RCA 100p, LIMA->LAD ok, VG->OM 100, VG->RPDA 100.  Marland Kitchen. GERD (gastroesophageal reflux disease)   . HLD (hyperlipidemia)   . HTN (hypertension)   . Moderate aortic stenosis    a. 03/2017 Echo: EF 60-65%, Gr2 DD, mod AS.  . Tobacco abuse     Patient Active Problem List   Diagnosis Date Noted  . Nonrheumatic aortic valve stenosis   . Status post coronary artery stent placement   . Malnutrition of moderate degree 03/24/2017  . Unstable angina (HCC) 03/23/2017  . Fall at home 12/24/2013  . Left hip pain 12/24/2013  . Back pain, lumbosacral 12/24/2013  . Loss of weight 12/24/2013  . Right ear pain 12/24/2013  . Lump in neck 12/24/2013  . Tobacco abuse 12/24/2013  . Hearing loss in right ear 12/10/2013  . Ear drainage right  12/10/2013  . Chronic left hip pain 12/10/2013  . Otitis externa of right ear 12/10/2013  . Submandibular sialoadenitis 10/09/2013  . Mass of right submandibular region 10/09/2013  . COPD bronchitis 10/09/2013  . Difficulty swallowing 10/09/2013  . Unintentional weight loss of more than 10 pounds in 90 days 10/09/2013  . 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 HTN (hypertension) 06/24/2013  . COPD (chronic obstructive pulmonary disease) (HCC) 06/24/2013  . CAD (coronary artery disease), bypass graft transplanted heart 06/24/2013  . HLD (hyperlipidemia) 06/24/2013  . Tobacco use disorder 06/24/2013    Past Surgical History:  Procedure Laterality Date  . BACK SURGERY    . CORONARY ARTERY BYPASS GRAFT  2008  . Coronary Balloon Angioplasty N/A 03/26/2017   Performed by Tonny Bollmanooper, Michael, [facility]  . Coronary Stent Intervention N/A 03/26/2017   [facility]  . Right/Left Heart Cath and Coronary/Graft Angiography N/A 03/26/2017   Performed by Tonny Bollmanooper, Michael, [facility]       Home Medications    Prior to Admission medications   Medication Sig Start Date End Date Taking? Authorizing Provider  albuterol (PROVENTIL HFA;VENTOLIN HFA) 108 (90 BASE) MCG/ACT inhaler Inhale 2 puffs into the lungs every 6 (six) hours as needed for wheezing or shortness of breath. 10/09/13   Kela MillinBarrino, Alethea Y,  MD  aspirin EC 81 MG EC tablet Take 1 tablet (81 mg total) by mouth daily. 03/28/17   Ok Anis, NP  atorvastatin (LIPITOR) 40 MG tablet Take 1 tablet (40 mg total) by mouth daily at 6 PM. 03/27/17   Maxie Barb, MD  cephALEXin (KEFLEX) 500 MG capsule Take 1 capsule (500 mg total) 4 (four) times daily by mouth. 07/22/17   Maxwell Caul, PA-C  Cholecalciferol (VITAMIN D3) 1000 units CAPS Take 1 capsule by mouth daily.     [provider]  clopidogrel (PLAVIX) 75 MG tablet Take 1 tablet (75 mg total) by  mouth daily with breakfast. 03/28/17   Ok Anis, NP  esomeprazole (NEXIUM) 20 MG capsule Take 20 mg by mouth daily at 12 noon.    [provider]  fluticasone (FLONASE) 50 MCG/ACT nasal spray Place 1 spray into both nostrils daily.    [provider]  metoprolol succinate (TOPROL-XL) 50 MG 24 hr tablet Take 1 tablet (50 mg total) by mouth at bedtime. 03/27/17   Ok Anis, NP  nicotine (NICODERM CQ - DOSED IN MG/24 HOURS) 21 mg/24hr patch Place 1 patch (21 mg total) onto the skin daily. 03/28/17   Ok Anis, NP  umeclidinium bromide (INCRUSE ELLIPTA) 62.5 MCG/INH AEPB Inhale 1 puff into the lungs daily.  08/12/15   [provider]    Family History Family History  Problem Relation Age of Onset  . Heart attack Mother   . Alcohol abuse Father   . CAD Brother     Social History Social History   Tobacco Use  . Smoking status: Current Every Day Smoker    Packs/day: 1.00    Years: 45.00    Pack years: 45.00    Types: Cigarettes  . Smokeless tobacco: Never Used  Substance Use Topics  . Alcohol use: Yes    Comment: occasionally  . Drug use: No     Allergies   Patient has no known allergies.   Review of Systems Review of Systems  Constitutional: Negative for fever.  Skin: Positive for wound.     Physical Exam Updated Vital Signs BP (!) 111/92 (BP Location: Right Arm)   Pulse 78   Temp 97.8 F (36.6 C) (Oral)   Resp 18   Ht 5\' 8"  (1.727 m)   Wt 89.8 kg (198 lb)   SpO2 99%   BMI 30.11 kg/m   Physical Exam  Constitutional: He is oriented to person, place, and time. He appears well-developed and well-nourished.  Appears uncomfortable but no acute distress   HENT:  Head: Normocephalic and atraumatic.  Mouth/Throat: Oropharynx is clear and moist and mucous membranes are normal.  Eyes: Conjunctivae, EOM and lids are normal. Pupils are equal, round, and reactive to light.  Neck: Full passive range of motion without  pain.  Cardiovascular: Normal rate, regular rhythm and normal pulses.  Pulmonary/Chest: Effort normal.  Abdominal: Soft. Normal appearance. There is no tenderness. There is no rigidity and no guarding.  Genitourinary: Rectum normal and prostate normal. Rectal exam shows no external hemorrhoid, no internal hemorrhoid, no fissure and no mass.     Genitourinary Comments: The exam was performed with a chaperone present.  Musculoskeletal: Normal range of motion.  Neurological: He is alert and oriented to person, place, and time.  Skin: Skin is warm and dry. Capillary refill takes less than 2 seconds.  Psychiatric: He has a normal mood and affect. His speech is normal.  Nursing  note and vitals reviewed.    ED Treatments / Results  Labs (all labs ordered are listed, but only abnormal results are displayed) Labs Reviewed  I-STAT CHEM 8, ED - Abnormal; Notable for the following components:      Result Value   BUN 22 (*)    All other components within normal limits  CBC WITH DIFFERENTIAL/PLATELET    EKG  EKG Interpretation None       Radiology Ct Pelvis W Contrast  Result Date: 07/22/2017 CLINICAL DATA:  Possible subcutaneous abscess EXAM: CT PELVIS WITH CONTRAST TECHNIQUE: Multidetector CT imaging of the pelvis was performed using the standard protocol following the bolus administration of intravenous contrast. CONTRAST:  100mL ISOVUE-300 IOPAMIDOL (ISOVUE-300) INJECTION 61% COMPARISON:  None. FINDINGS: Urinary Tract: Bladder is well distended. No filling defect is identified. Bowel: No obstructive or inflammatory changes are seen. The appendix is not well visualized although no inflammatory changes are noted. Correlation with any possible surgical history is recommended. Visualized small bowel is within normal limits. Vascular/Lymphatic: Diffuse atherosclerotic calcifications are noted. No sizable lymphadenopathy is noted. Reproductive: Prostate demonstrates diffuse calcifications  although no other focal abnormality is seen. Other: Minimal skin thickening is noted along the superior aspect of the gluteal cleft although no focal fluid collection or abscess is identified. This may represent some localized skin inflammation. No perirectal or perianal abscess is noted. Musculoskeletal: Degenerative changes of lumbar spine are noted. No acute bony abnormality is seen. IMPRESSION: Minimal skin thickening at the superior aspect of the gluteal cleft. No definitive abscess is seen. No other acute abnormality is noted. Electronically Signed   By: Alcide CleverMark  Lukens M.D.   On: 07/22/2017 15:40    Procedures Procedures (including critical care time)  Medications Ordered in ED Medications  acetaminophen (TYLENOL) tablet 650 mg (650 mg Oral Given 07/22/17 1340)  iopamidol (ISOVUE-300) 61 % injection 100 mL (100 mLs Intravenous Contrast Given 07/22/17 1514)  bacitracin ointment (1 application Topical Given 07/22/17 1552)     Initial Impression / Assessment and Plan / ED Course  I have reviewed the triage vital signs and the nursing notes.  Pertinent labs & imaging results that were available during my care of the patient were reviewed by me and considered in my medical decision making (see chart for details).     66 year old male who presents with 3 weeks of pilonidal pain/buttock pain.  Difficulty sitting secondary to symptoms.  No fevers, chills.  Has noted superficial abrasions to the area but denies any purulent drainage. Patient is afebrile, non-toxic appearing. He exibits some difficulty sitting secondary to symptoms. Vital signs reviewed and stable.  Physical exam does show some mild superficial abrasions noted to the gluteal cleft region.  There is some surrounding induration and erythema extends down toward the perianal area. There is no palpable fluctuance.  Consider abrasion versus cellulitis vs vs early pressure ulcer.  Low suspicion for perirectal abscess but given surrounding  induration and difficulty sitting, possible early abscess. History/physical exam are not concerning for prostatitis. Will obtain basic labs and CT pelvis for evaluation of perirectal abscess.   Labs and imaging reviewed.  BMP unremarkable.  CBC unremarkable.  CT pelvis shows no evidence of abscess.  There is mention of some skin thickening to the superior gluteal cleft that may be concerning for skin inflammation.  Discussed results with patient.  Given surrounding warmth and induration, will plan to treat for cellulitis.  Patient encouraged on the use of bacitracin, Vaseline or zinc ointment as barrier  cream for further skin breakdown.  Wound care instructions discussed with patient.  Patient instructed to follow-up with his primary care doctor in the next 24-48 hours for further evaluation. Strict return precautions discussed. Patient expresses understanding and agreement to plan.   Final Clinical Impressions(s) / ED Diagnoses   Final diagnoses:  Cellulitis, unspecified cellulitis site  Abrasion    ED Discharge Orders        Ordered    cephALEXin (KEFLEX) 500 MG capsule  4 times daily     07/22/17 1550       Rosana Hoes 07/22/17 1649    Jacalyn Lefevre, MD 07/23/17 [phone]

## 2017-07-22 NOTE — Discharge Instructions (Signed)
You can take Tylenol or Ibuprofen as directed for pain. You can alternate Tylenol and Ibuprofen every 4 hours. If you take Tylenol at 1pm, then you can take Ibuprofen at 5pm. Then you can take Tylenol again at 9pm.   Take antibiotics as directed. Please take all of your antibiotics until finished.  Soaking in a warm bath may help symptoms.  Make sure that you are completely dry before putting any type of bandage or clothing on.  As we discussed, apply Vaseline or bacitracin or Neosporin ointment to provide a protective barrier to the spot.  Follow-up with your primary care doctor in the next 2-4 days.  Return the emergency department for any worsening pain, drainage from the area, fevers or any other worsening or concerning symptoms.

## 2017-07-22 NOTE — ED Notes (Signed)
Pt presents with an abrasion midline of his buttocks on the superior aspect where the two butt cheeks meet. Minor signs of bleeding and tearing of skin.

## 2017-07-22 NOTE — ED Notes (Signed)
Pt also presents with two nodules behind his left thigh in the later stages of healing. Pt states they cause minimal pain.

## 2017-07-22 NOTE — ED Triage Notes (Signed)
Abscess to upper buttocks for three weeks.

## 2017-10-11 DIAGNOSIS — M25562 Pain in left knee: Secondary | ICD-10-CM | POA: Diagnosis not present

## 2017-10-11 DIAGNOSIS — I10 Essential (primary) hypertension: Secondary | ICD-10-CM | POA: Diagnosis not present

## 2017-10-11 DIAGNOSIS — G43009 Migraine without aura, not intractable, without status migrainosus: Secondary | ICD-10-CM | POA: Diagnosis not present

## 2017-10-11 DIAGNOSIS — Z79899 Other long term (current) drug therapy: Secondary | ICD-10-CM | POA: Diagnosis not present

## 2017-10-11 DIAGNOSIS — R202 Paresthesia of skin: Secondary | ICD-10-CM | POA: Diagnosis not present

## 2017-10-19 DIAGNOSIS — E559 Vitamin D deficiency, unspecified: Secondary | ICD-10-CM | POA: Diagnosis not present

## 2017-10-19 DIAGNOSIS — I10 Essential (primary) hypertension: Secondary | ICD-10-CM | POA: Diagnosis not present

## 2017-10-19 DIAGNOSIS — E785 Hyperlipidemia, unspecified: Secondary | ICD-10-CM | POA: Diagnosis not present

## 2017-10-25 DIAGNOSIS — E559 Vitamin D deficiency, unspecified: Secondary | ICD-10-CM | POA: Diagnosis not present

## 2017-10-25 DIAGNOSIS — R202 Paresthesia of skin: Secondary | ICD-10-CM | POA: Diagnosis not present

## 2017-10-25 DIAGNOSIS — E785 Hyperlipidemia, unspecified: Secondary | ICD-10-CM | POA: Diagnosis not present

## 2017-10-25 DIAGNOSIS — M25562 Pain in left knee: Secondary | ICD-10-CM | POA: Diagnosis not present

## 2017-10-25 DIAGNOSIS — M25561 Pain in right knee: Secondary | ICD-10-CM | POA: Diagnosis not present

## 2017-11-05 ENCOUNTER — Telehealth: Payer: Self-pay | Admitting: Orthopedic Surgery

## 2017-11-05 NOTE — Telephone Encounter (Signed)
Patient was previously scheduled to see Dr. Hilda LiasKeeling back in November.  He was a no show for this appointment which was for left hand pain per the referral from Dr. Elmyra RicksKim's office.  Patient came in today requesting an appointment for his right hand.  I reminded him that he missed the other appointment that we had previously set for him.  I asked him if Dr. Selena BattenKim had evaluated this right hand pain and he said no it had not been looked at.  I told him that since we did not have any open appointments for a couple of weeks, he may want to see Dr. Selena BattenKim and get this hand evaluated.  I told him that if they think he needs to be referred to our office after the evaluation, we would be glad to schedule him.  He was fine with this.

## 2017-11-09 DIAGNOSIS — Z951 Presence of aortocoronary bypass graft: Secondary | ICD-10-CM | POA: Diagnosis not present

## 2017-11-09 DIAGNOSIS — M533 Sacrococcygeal disorders, not elsewhere classified: Secondary | ICD-10-CM | POA: Diagnosis not present

## 2017-11-09 DIAGNOSIS — Z7951 Long term (current) use of inhaled steroids: Secondary | ICD-10-CM | POA: Diagnosis not present

## 2017-11-09 DIAGNOSIS — M545 Low back pain: Secondary | ICD-10-CM | POA: Diagnosis not present

## 2017-11-09 DIAGNOSIS — L89151 Pressure ulcer of sacral region, stage 1: Secondary | ICD-10-CM | POA: Diagnosis not present

## 2017-11-09 DIAGNOSIS — K219 Gastro-esophageal reflux disease without esophagitis: Secondary | ICD-10-CM | POA: Diagnosis not present

## 2017-11-09 DIAGNOSIS — J449 Chronic obstructive pulmonary disease, unspecified: Secondary | ICD-10-CM | POA: Diagnosis not present

## 2017-11-09 DIAGNOSIS — Z79899 Other long term (current) drug therapy: Secondary | ICD-10-CM | POA: Diagnosis not present

## 2017-11-09 DIAGNOSIS — I10 Essential (primary) hypertension: Secondary | ICD-10-CM | POA: Diagnosis not present

## 2017-11-09 DIAGNOSIS — E7800 Pure hypercholesterolemia, unspecified: Secondary | ICD-10-CM | POA: Diagnosis not present

## 2017-11-14 ENCOUNTER — Encounter: Payer: Self-pay | Admitting: Cardiovascular Disease

## 2017-11-14 ENCOUNTER — Ambulatory Visit (INDEPENDENT_AMBULATORY_CARE_PROVIDER_SITE_OTHER): Payer: Medicare Other | Admitting: Cardiovascular Disease

## 2017-11-14 VITALS — BP 148/98 | HR 67 | Ht 68.0 in | Wt 199.0 lb

## 2017-11-14 DIAGNOSIS — I10 Essential (primary) hypertension: Secondary | ICD-10-CM | POA: Diagnosis not present

## 2017-11-14 DIAGNOSIS — I25708 Atherosclerosis of coronary artery bypass graft(s), unspecified, with other forms of angina pectoris: Secondary | ICD-10-CM | POA: Diagnosis not present

## 2017-11-14 DIAGNOSIS — M79604 Pain in right leg: Secondary | ICD-10-CM | POA: Diagnosis not present

## 2017-11-14 DIAGNOSIS — E785 Hyperlipidemia, unspecified: Secondary | ICD-10-CM | POA: Diagnosis not present

## 2017-11-14 DIAGNOSIS — I350 Nonrheumatic aortic (valve) stenosis: Secondary | ICD-10-CM | POA: Diagnosis not present

## 2017-11-14 DIAGNOSIS — M79605 Pain in left leg: Secondary | ICD-10-CM

## 2017-11-14 MED ORDER — NITROGLYCERIN 0.4 MG SL SUBL: 0.4000 mg | SUBLINGUAL_TABLET | SUBLINGUAL | 3 refills | Status: DC | PRN

## 2017-11-14 MED ORDER — LOSARTAN POTASSIUM 25 MG PO TABS: 25.0000 mg | ORAL_TABLET | Freq: Every day | ORAL | 3 refills | Status: DC

## 2017-11-14 MED ORDER — ATORVASTATIN CALCIUM 80 MG PO TABS: 80.0000 mg | ORAL_TABLET | Freq: Every day | ORAL | 3 refills | Status: DC

## 2017-11-14 NOTE — Patient Instructions (Signed)
Medication Instructions:  INCREASE LIPITOR TO 80 MG DAILY    START LOSARTAN 25 MG DAILY   Labwork: NONE  Testing/Procedures: Your physician has requested that you have an ankle brachial index (ABI). During this test an ultrasound and blood pressure cuff are used to evaluate the arteries that supply the arms and legs with blood. Allow thirty minutes for this exam. There are no restrictions or special instructions.    Follow-Up: Your physician wants you to follow-up in: 6 MONTHS.  You will receive a reminder letter in the mail two months in advance. If you don't receive a letter, please call our office to schedule the follow-up appointment.   Any Other Special Instructions Will Be Listed Below (If Applicable).     If you need a refill on your cardiac medications before your next appointment, please call your pharmacy.

## 2017-11-14 NOTE — Progress Notes (Signed)
SUBJECTIVE: The patient presents to establish care in our [place] office.  He was hospitalized for unstable angina in July 2018 and underwent drug-eluting stent placement of the proximal left circumflex and balloon angioplasty of obtuse marginal branches.  He has a history of CABG and coronary angiogram demonstrated patency of the LIMA to LAD and total occlusion of the saphenous vein graft to the distal RCA and OM branches.  The right PDA branch collateralized from the LAD visualized on the LIMA injections.  He was also found to have moderate aortic stenosis with a mean gradient of 24 mmHg and a peak to peak gradient of 34 mill meters mercury, with a calculated aortic valve area of 0.96 cm.  Echocardiogram on 03/24/17 demonstrated normal left ventricular systolic function and regional wall motion, LVEF 60-65%, mild LVH, grade 2 diastolic dysfunction, and moderate aortic stenosis.  He seldom has chest pains.  He does not have much shortness of breath so long as he uses his inhalers.  He is trying to quit smoking and is using nicotine patches.  Upon further discussion, he has cramping in his quadriceps and calves with walking.  He denies palpitations and leg swelling.  ECG performed in the office today which I ordered and personally interpreted demonstrates normal sinus rhythm with no ischemic ST segment or T-wave abnormalities, nor any arrhythmias.    Review of Systems: As per "subjective", otherwise negative.  No Known Allergies  Current Outpatient Medications  Medication Sig Dispense Refill  . albuterol (PROVENTIL HFA;VENTOLIN HFA) 108 (90 BASE) MCG/ACT inhaler Inhale 2 puffs into the lungs every 6 (six) hours as needed for wheezing or shortness of breath. 1 Inhaler 5  . aspirin EC 81 MG EC tablet Take 1 tablet (81 mg total) by mouth daily. 30 tablet 6  . atorvastatin (LIPITOR) 40 MG tablet Take 1 tablet (40 mg total) by mouth daily at 6 PM. 30 tablet 0  . cephALEXin (KEFLEX) 500  MG capsule Take 1 capsule (500 mg total) 4 (four) times daily by mouth. 28 capsule 0  . Cholecalciferol (VITAMIN D3) 1000 units CAPS Take 1 capsule by mouth daily.     . clopidogrel (PLAVIX) 75 MG tablet Take 1 tablet (75 mg total) by mouth daily with breakfast. 30 tablet 6  . esomeprazole (NEXIUM) 20 MG capsule Take 20 mg by mouth daily at 12 noon.    . fluticasone (FLONASE) 50 MCG/ACT nasal spray Place 1 spray into both nostrils daily.    . metoprolol succinate (TOPROL-XL) 50 MG 24 hr tablet Take 1 tablet (50 mg total) by mouth at bedtime. 30 tablet 6  . nicotine (NICODERM CQ - DOSED IN MG/24 HOURS) 21 mg/24hr patch Place 1 patch (21 mg total) onto the skin daily. 30 patch 1  . umeclidinium bromide (INCRUSE ELLIPTA) 62.5 MCG/INH AEPB Inhale 1 puff into the lungs daily.      No current facility-administered medications for this visit.     Past Medical History:  Diagnosis Date  . Arthritis   . CAD (coronary artery disease)    a. s/p prior CABG x 3 9LIMA->LAD, VG->RPDA, VG->OM;  b. s/p prior stenting of OM1;  c. 03/2017 Cath/PCI: LAD 100, LCX 90p (3.5x20 Synergy DES), OM1 90 ISR (CBA), RCA 100p, LIMA->LAD ok, VG->OM 100, VG->RPDA 100.  Marland Kitchen GERD (gastroesophageal reflux disease)   . HLD (hyperlipidemia)   . HTN (hypertension)   . Moderate aortic stenosis    a. 03/2017 Echo: EF 60-65%, Gr2 DD, mod  AS.  . Tobacco abuse     Past Surgical History:  Procedure Laterality Date  . BACK SURGERY    . CORONARY ARTERY BYPASS GRAFT  2008  . CORONARY BALLOON ANGIOPLASTY N/A 03/26/2017   Procedure: Coronary Balloon Angioplasty;  Surgeon: Tonny Bollman, MD;  Location: [facility] INVASIVE CV LAB;  Service: Cardiovascular;  Laterality: N/A;  . CORONARY STENT INTERVENTION N/A 03/26/2017   Procedure: Coronary Stent Intervention;  Surgeon: Tonny Bollman, MD;  Location: [facility] INVASIVE CV LAB;  Service: Cardiovascular;  Laterality: N/A;  . RIGHT/LEFT HEART CATH AND CORONARY/GRAFT ANGIOGRAPHY N/A 03/26/2017   Procedure:  Right/Left Heart Cath and Coronary/Graft Angiography;  Surgeon: Tonny Bollman, MD;  Location: [facility] INVASIVE CV LAB;  Service: Cardiovascular;  Laterality: N/A;    Social History   Socioeconomic History  . Marital status: Divorced    Spouse name: Not on file  . Number of children: Not on file  . Years of education: Not on file  . Highest education level: Not on file  Social Needs  . Financial resource strain: Not on file  . Food insecurity - worry: Not on file  . Food insecurity - inability: Not on file  . Transportation needs - medical: Not on file  . Transportation needs - non-medical: Not on file  Occupational History  . Not on file  Tobacco Use  . Smoking status: Current Every Day Smoker    Packs/day: 2.00    Years: 45.00    Pack years: 90.00    Types: Cigarettes  . Smokeless tobacco: Never Used  Substance and Sexual Activity  . Alcohol use: Yes    Comment: occasionally  . Drug use: No  . Sexual activity: No  Other Topics Concern  . Not on file  Social History Narrative  . Not on file     Vitals:   11/14/17 0909 11/14/17 0913  BP: (!) 148/92 (!) 148/98  Pulse: 67   SpO2: 98%   Weight: 199 lb (90.3 kg)   Height: 5\' 8"  (1.727 m)     Wt Readings from Last 3 Encounters:  11/14/17 199 lb (90.3 kg)  07/22/17 198 lb (89.8 kg)  06/03/17 198 lb (89.8 kg)     PHYSICAL EXAM General: NAD HEENT: Normal. Neck: No JVD, no thyromegaly. Lungs: Clear to auscultation bilaterally with normal respiratory effort. CV: Regular rate and rhythm, normal S1/S2, no S3/S4, 3/6 ejection systolic murmur heard throughout precordium but loudest over right upper sternal border. No pretibial or periankle edema.  Diminished pedal pulses bilaterally.  No carotid bruit.   Abdomen: Soft, nontender, no distention.  Neurologic: Alert and oriented.  Psych: Normal affect. Skin: Normal. Musculoskeletal: No gross deformities.    ECG: Most recent ECG reviewed.   Labs: Lab Results    Component Value Date/Time   K 4.1 07/22/2017 01:17 PM   BUN 22 (H) 07/22/2017 01:17 PM   CREATININE 1.00 07/22/2017 01:17 PM   CREATININE 0.68 12/24/2013 09:59 AM   ALT 9 06/24/2013 11:19 AM   TSH 0.758 12/24/2013 09:59 AM   HGB 13.3 07/22/2017 01:17 PM     Lipids: Lab Results  Component Value Date/Time   LDLCALC 90 03/25/2017 05:19 AM   CHOL 168 03/25/2017 05:19 AM   TRIG 62 03/25/2017 05:19 AM   HDL 66 03/25/2017 05:19 AM       ASSESSMENT AND PLAN: 1.  Coronary artery disease with PCI in July 2018: Symptomatically stable.  Coronary angiogram details noted above.  Continue aspirin, Plavix, Lipitor, and metoprolol  succinate.  Plavix will be discontinued in August 2019.  I will provide a prescription for sublingual nitroglycerin.  I will aim to control blood pressure with the addition of losartan.  2.  Moderate aortic stenosis: Symptomatically stable.  I will continue to monitor clinically and with surveillance echocardiography.  3.  Hyperlipidemia: I reviewed a lipid panel performed on 06/21/17 which showed total cholesterol 188, triglycerides 92, HDL 71, LDL 99.  I will increase Lipitor to 80 mg daily.  4.  Hypertension: Blood pressure is mildly elevated.  I will start losartan 25 mg daily.  5.  Bilateral leg pain: Given his long history of tobacco abuse and concomitant history of coronary disease, I am concerned he may have peripheral vascular disease.  I will obtain ankle-brachial indices.    Disposition: Follow up 6 months.  Time spent: 40 minutes, of which greater than 50% was spent reviewing symptoms, relevant blood tests and studies, and discussing management plan with the patient.    Prentice DockerSuresh Addalyn Speedy, M.D., F.A.C.C.

## 2017-11-19 ENCOUNTER — Ambulatory Visit (HOSPITAL_COMMUNITY)
Admission: RE | Admit: 2017-11-19 | Discharge: 2017-11-19 | Disposition: A | Discharge status: Home / Self Care | Payer: Medicare Other | Source: Ambulatory Visit | Attending: Cardiovascular Disease | Admitting: Cardiovascular Disease

## 2017-11-19 DIAGNOSIS — M79605 Pain in left leg: Secondary | ICD-10-CM | POA: Insufficient documentation

## 2017-11-19 DIAGNOSIS — M79604 Pain in right leg: Secondary | ICD-10-CM

## 2017-11-19 DIAGNOSIS — M79661 Pain in right lower leg: Secondary | ICD-10-CM | POA: Diagnosis not present

## 2017-11-19 DIAGNOSIS — M79662 Pain in left lower leg: Secondary | ICD-10-CM | POA: Diagnosis not present

## 2017-11-20 DIAGNOSIS — H00015 Hordeolum externum left lower eyelid: Secondary | ICD-10-CM | POA: Diagnosis not present

## 2017-11-21 ENCOUNTER — Telehealth: Payer: Self-pay | Admitting: Cardiovascular Disease

## 2017-11-21 NOTE — Telephone Encounter (Signed)
Spoke with Victorino DikeJennifer, clarified that pt should be on lipitor 80 mg daily.

## 2017-11-21 NOTE — Telephone Encounter (Signed)
Pharmacist w/ [facility] Victorino Dike(Jennifer) needing clarification on patient's medications. / tg

## 2017-11-27 ENCOUNTER — Ambulatory Visit (INDEPENDENT_AMBULATORY_CARE_PROVIDER_SITE_OTHER): Payer: Medicare Other

## 2017-11-27 ENCOUNTER — Ambulatory Visit (INDEPENDENT_AMBULATORY_CARE_PROVIDER_SITE_OTHER): Payer: Medicare Other | Admitting: Orthopedic Surgery

## 2017-11-27 ENCOUNTER — Encounter: Payer: Self-pay | Admitting: Orthopedic Surgery

## 2017-11-27 VITALS — BP 142/94 | HR 62 | Ht 68.0 in | Wt 195.0 lb

## 2017-11-27 DIAGNOSIS — M79641 Pain in right hand: Secondary | ICD-10-CM | POA: Diagnosis not present

## 2017-11-27 DIAGNOSIS — M19049 Primary osteoarthritis, unspecified hand: Secondary | ICD-10-CM

## 2017-11-27 MED ORDER — TRAMADOL-ACETAMINOPHEN 37.5-325 MG PO TABS: 1.0000 | ORAL_TABLET | ORAL | 0 refills | Status: DC | PRN

## 2017-11-27 NOTE — Progress Notes (Signed)
NEW PATIENT OFFICE VISIT   Chief Complaint  Patient presents with  . Hand Pain    Right hand pain, no injury.    67 year old male presents from Dr. Elmyra Ricks clinic with pain in his right thumb  The patient presents with moderate to severe dull aching pain right thumb at the [facility] joint for several months associated with decreased range of motion and trouble with pinching activities   Review of Systems  Constitutional: Negative for fever.  Respiratory: Negative.   Cardiovascular: Positive for chest pain.  Skin: Negative.   Neurological: Negative for tingling, sensory change and weakness.     Past Medical History:  Diagnosis Date  . Arthritis   . CAD (coronary artery disease)    a. s/p prior CABG x 3 9LIMA->LAD, VG->RPDA, VG->OM;  b. s/p prior stenting of OM1;  c. 03/2017 Cath/PCI: LAD 100, LCX 90p (3.5x20 Synergy DES), OM1 90 ISR (CBA), RCA 100p, LIMA->LAD ok, VG->OM 100, VG->RPDA 100.  Marland Kitchen GERD (gastroesophageal reflux disease)   . HLD (hyperlipidemia)   . HTN (hypertension)   . Moderate aortic stenosis    a. 03/2017 Echo: EF 60-65%, Gr2 DD, mod AS.  . Tobacco abuse     Past Surgical History:  Procedure Laterality Date  . BACK SURGERY    . CORONARY ARTERY BYPASS GRAFT  2008  . CORONARY BALLOON ANGIOPLASTY N/A 03/26/2017   Procedure: Coronary Balloon Angioplasty;  Surgeon: Tonny Bollman, MD;  Location: [facility] INVASIVE CV LAB;  Service: Cardiovascular;  Laterality: N/A;  . CORONARY STENT INTERVENTION N/A 03/26/2017   Procedure: Coronary Stent Intervention;  Surgeon: Tonny Bollman, MD;  Location: [facility] INVASIVE CV LAB;  Service: Cardiovascular;  Laterality: N/A;  . RIGHT/LEFT HEART CATH AND CORONARY/GRAFT ANGIOGRAPHY N/A 03/26/2017   Procedure: Right/Left Heart Cath and Coronary/Graft Angiography;  Surgeon: Tonny Bollman, MD;  Location: [facility] INVASIVE CV LAB;  Service: Cardiovascular;  Laterality: N/A;    Family History  Problem Relation Age of Onset  . Heart attack Mother   .  Alcohol abuse Father   . CAD Brother    Social History   Tobacco Use  . Smoking status: Current Every Day Smoker    Packs/day: 2.00    Years: 45.00    Pack years: 90.00    Types: Cigarettes  . Smokeless tobacco: Never Used  Substance Use Topics  . Alcohol use: Yes    Comment: occasionally  . Drug use: No    No Known Allergies   No outpatient medications have been marked as taking for the 11/27/17 encounter (Office Visit) with Vickki Hearing, MD.    BP (!) 142/94   Pulse 62   Ht 5\' 8"  (1.727 m)   Wt 195 lb (88.5 kg)   BMI 29.65 kg/m   Physical Exam  Ortho Exam  MEDICAL DECISION SECTION  xrays ordered?  Yes  My independent reading of xrays: See dictated report the patient has CMC arthrosis with subluxation of the [facility] joint right hand right thumb   Encounter Diagnoses  Name Primary?  . Hand pain, right   . CMC arthritis Yes     PLAN:   He is on a blood thinner Plavix so no NSAIDs were ordered I did put him on Ultracet for pain relief and placed him in a Rhino thumb splint for 6 weeks  He will come back for reevaluation if no improvement we can refer him to the hand specialist  Meds ordered this encounter  Medications  . traMADol-acetaminophen (ULTRACET) 37.5-325 MG  tablet    Sig: Take 1 tablet by mouth every 4 (four) hours as needed.    Dispense:  30 tablet    Refill:  0   Injection? no MRI/CT/? no

## 2018-01-07 ENCOUNTER — Encounter: Payer: Self-pay | Admitting: Orthopedic Surgery

## 2018-01-07 ENCOUNTER — Ambulatory Visit (INDEPENDENT_AMBULATORY_CARE_PROVIDER_SITE_OTHER): Payer: Medicare Other | Admitting: Orthopedic Surgery

## 2018-01-07 VITALS — BP 126/86 | HR 62 | Ht 68.0 in | Wt 196.0 lb

## 2018-01-07 DIAGNOSIS — M19049 Primary osteoarthritis, unspecified hand: Secondary | ICD-10-CM | POA: Diagnosis not present

## 2018-01-07 DIAGNOSIS — I25708 Atherosclerosis of coronary artery bypass graft(s), unspecified, with other forms of angina pectoris: Secondary | ICD-10-CM

## 2018-01-07 NOTE — Progress Notes (Signed)
Progress Note   Patient ID: JOVEN MOM, male   DOB: July 07, 1951, 67 y.o.   MRN: [id number]  Chief Complaint  Patient presents with  . Hand Pain    right      Medical decision-making Encounter Diagnosis  Name Primary?  . CMC arthritis Yes   PLAN:  I treated this patient with splinting and Tylenol (on Plavix no NSAIDs) for 6 weeks he did not improve with increasing pain  I am sending him to a hand specialist for further definitive management       Chief Complaint  Patient presents with  . Hand Pain    right     As we note below the patient has CMC joint pain for over 6 months with decreased range of motion and pinching difficulty he is dropping things he has not improved with splinting  His review of systems has not changed the patient presents with moderate to severe dull aching pain right thumb at the [facility] joint for several months associated with decreased range of motion and trouble with pinching activities   Review of Systems  Constitutional: Negative for fever.  Respiratory: Negative.   Cardiovascular: Positive for chest pain.  Skin: Negative.   Neurological: Negative for tingling, sensory change and weakness.      Review of Systems  Constitutional:       No change in review of systems from November 27, 2017   Current Meds  Medication Sig  . albuterol (PROVENTIL HFA;VENTOLIN HFA) 108 (90 BASE) MCG/ACT inhaler Inhale 2 puffs into the lungs every 6 (six) hours as needed for wheezing or shortness of breath.  Marland Kitchen aspirin EC 81 MG EC tablet Take 1 tablet (81 mg total) by mouth daily.  Marland Kitchen atorvastatin (LIPITOR) 80 MG tablet Take 1 tablet (80 mg total) by mouth daily.  . Cholecalciferol (VITAMIN D3) 1000 units CAPS Take 1 capsule by mouth daily.   . clopidogrel (PLAVIX) 75 MG tablet Take 1 tablet (75 mg total) by mouth daily with breakfast.  . esomeprazole (NEXIUM) 20 MG capsule Take 20 mg by mouth daily at 12 noon.  . fluticasone (FLONASE) 50 MCG/ACT nasal spray  Place 1 spray into both nostrils daily.  Marland Kitchen losartan (COZAAR) 25 MG tablet Take 1 tablet (25 mg total) by mouth daily.  . metoprolol succinate (TOPROL-XL) 50 MG 24 hr tablet Take 1 tablet (50 mg total) by mouth at bedtime.  . nicotine (NICODERM CQ - DOSED IN MG/24 HOURS) 21 mg/24hr patch Place 1 patch (21 mg total) onto the skin daily.  . nitroGLYCERIN (NITROSTAT) 0.4 MG SL tablet Place 1 tablet (0.4 mg total) under the tongue every 5 (five) minutes as needed for chest pain.  . traMADol-acetaminophen (ULTRACET) 37.5-325 MG tablet Take 1 tablet by mouth every 4 (four) hours as needed.  . umeclidinium bromide (INCRUSE ELLIPTA) 62.5 MCG/INH AEPB Inhale 1 puff into the lungs daily.     No Known Allergies   BP 126/86   Pulse 62   Ht  (1.727 m)   Wt 196 lb (88.9 kg)   BMI 29.80 kg/m   Physical Exam  Musculoskeletal:       Arms:      Fuller Canada, MD 01/07/2018 10:05 AM

## 2018-01-08 DIAGNOSIS — Z008 Encounter for other general examination: Secondary | ICD-10-CM | POA: Diagnosis not present

## 2018-01-08 DIAGNOSIS — Z125 Encounter for screening for malignant neoplasm of prostate: Secondary | ICD-10-CM | POA: Diagnosis not present

## 2018-01-08 DIAGNOSIS — Z1389 Encounter for screening for other disorder: Secondary | ICD-10-CM | POA: Diagnosis not present

## 2018-01-08 DIAGNOSIS — I10 Essential (primary) hypertension: Secondary | ICD-10-CM | POA: Diagnosis not present

## 2018-01-08 DIAGNOSIS — R739 Hyperglycemia, unspecified: Secondary | ICD-10-CM | POA: Diagnosis not present

## 2018-01-08 DIAGNOSIS — E785 Hyperlipidemia, unspecified: Secondary | ICD-10-CM | POA: Diagnosis not present

## 2018-01-10 DIAGNOSIS — I10 Essential (primary) hypertension: Secondary | ICD-10-CM | POA: Diagnosis not present

## 2018-01-10 DIAGNOSIS — Z23 Encounter for immunization: Secondary | ICD-10-CM | POA: Diagnosis not present

## 2018-01-10 DIAGNOSIS — E785 Hyperlipidemia, unspecified: Secondary | ICD-10-CM | POA: Diagnosis not present

## 2018-01-10 DIAGNOSIS — Z0000 Encounter for general adult medical examination without abnormal findings: Secondary | ICD-10-CM | POA: Diagnosis not present

## 2018-01-14 ENCOUNTER — Ambulatory Visit (INDEPENDENT_AMBULATORY_CARE_PROVIDER_SITE_OTHER): Payer: Medicare Other | Admitting: Orthopaedic Surgery

## 2018-01-14 ENCOUNTER — Encounter (INDEPENDENT_AMBULATORY_CARE_PROVIDER_SITE_OTHER): Payer: Self-pay | Admitting: Orthopaedic Surgery

## 2018-01-14 DIAGNOSIS — M1811 Unilateral primary osteoarthritis of first carpometacarpal joint, right hand: Secondary | ICD-10-CM

## 2018-01-14 MED ORDER — METHYLPREDNISOLONE ACETATE 40 MG/ML IJ SUSP
13.3300 mg | INTRAMUSCULAR | Status: AC | PRN
  Administered 2018-01-14: 13.33 mg via INTRA_ARTICULAR

## 2018-01-14 MED ORDER — LIDOCAINE HCL 1 % IJ SOLN
0.3000 mL | INTRAMUSCULAR | Status: AC | PRN
  Administered 2018-01-14: .3 mL

## 2018-01-14 MED ORDER — BUPIVACAINE HCL 0.5 % IJ SOLN
0.3300 mL | INTRAMUSCULAR | Status: AC | PRN
  Administered 2018-01-14: .33 mL via INTRA_ARTICULAR

## 2018-01-14 NOTE — Progress Notes (Signed)
Office Visit Note   Patient: Craig Harmon           Date of Birth: Jan 24, 1951           MRN: [id number] Visit Date: 01/14/2018              Requested by: Vickki Hearing, MD [address] [place], [place] [address] PCP: Pearson Grippe, MD   Assessment & Plan: Visit Diagnoses:  1. Primary osteoarthritis of first carpometacarpal joint of right hand     Plan: Impression is right thumb basal joint arthritis.  Injection was performed today into the [facility] joint.  Patient tolerates well.  Questions encouraged and answered.  Follow-up as needed.  Follow-Up Instructions: Return if symptoms worsen or fail to improve.   Orders:  No orders of the defined types were placed in this encounter.  No orders of the defined types were placed in this encounter.     Procedures: Small Joint Inj: R thumb CMC on 01/14/2018 9:00 PM Indications: pain Details: 22 G needle Medications: 0.3 mL lidocaine 1 %; 0.33 mL bupivacaine 0.5 %; 13.33 mg methylPREDNISolone acetate 40 MG/ML Outcome: tolerated well, no immediate complications Patient was prepped and draped in the usual sterile fashion.       Clinical Data: No additional findings.   Subjective: Chief Complaint  Patient presents with  . Right Hand - Pain    [name] is a right-hand-dominant gentleman who comes in with right thumb pain at the base of his hand that is worse with use of the hand and grasping.  He had a previous CMC injection which did give him good relief.  Denies any numbness and tingling.  He is tried an abduction orthotic without any significant relief.  Denies any injuries.   Review of Systems  Constitutional: Negative.   All other systems reviewed and are negative.    Objective: Vital Signs: There were no vitals taken for this visit.  Physical Exam  Constitutional: He is oriented to person, place, and time. He appears well-developed and well-nourished.  HENT:  Head: Normocephalic and atraumatic.  Eyes:  Pupils are equal, round, and reactive to light.  Neck: Neck supple.  Pulmonary/Chest: Effort normal.  Abdominal: Soft.  Musculoskeletal: Normal range of motion.  Neurological: He is alert and oriented to person, place, and time.  Skin: Skin is warm.  Psychiatric: He has a normal mood and affect. His behavior is normal. Judgment and thought content normal.  Nursing note and vitals reviewed.   Ortho Exam Right hand exam shows positive grind test.  Thumb CMC joint is slightly prominent.  There is no triggering.  Negative Finkelstein's. Specialty Comments:  No specialty comments available.  Imaging: No results found.   PMFS History: Patient Active Problem List   Diagnosis Date Noted  . Nonrheumatic aortic valve stenosis   . Status post coronary artery stent placement   . Malnutrition of moderate degree 03/24/2017  . Unstable angina (HCC) 03/23/2017  . Fall at home 12/24/2013  . Left hip pain 12/24/2013  . Back pain, lumbosacral 12/24/2013  . Loss of weight 12/24/2013  . Right ear pain 12/24/2013  . Lump in neck 12/24/2013  . Tobacco abuse 12/24/2013  . Hearing loss in right ear 12/10/2013  . Ear drainage right 12/10/2013  . Chronic left hip pain 12/10/2013  . Otitis externa of right ear 12/10/2013  . Submandibular sialoadenitis 10/09/2013  . Mass of right submandibular region 10/09/2013  . COPD bronchitis 10/09/2013  . Difficulty  swallowing 10/09/2013  . Unintentional weight loss of more than 10 pounds in 90 days 10/09/2013  . 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 HTN (hypertension) 06/24/2013  . COPD (chronic obstructive pulmonary disease) (HCC) 06/24/2013  . CAD (coronary artery disease), bypass graft transplanted heart 06/24/2013  . HLD (hyperlipidemia) 06/24/2013  . Tobacco use disorder 06/24/2013   Past Medical History:  Diagnosis Date  . Arthritis   . CAD (coronary artery disease)    a. s/p prior CABG x 3 9LIMA->LAD, VG->RPDA, VG->OM;  b. s/p prior stenting  of OM1;  c. 03/2017 Cath/PCI: LAD 100, LCX 90p (3.5x20 Synergy DES), OM1 90 ISR (CBA), RCA 100p, LIMA->LAD ok, VG->OM 100, VG->RPDA 100.  Marland Kitchen GERD (gastroesophageal reflux disease)   . HLD (hyperlipidemia)   . HTN (hypertension)   . Moderate aortic stenosis    a. 03/2017 Echo: EF 60-65%, Gr2 DD, mod AS.  . Tobacco abuse     Family History  Problem Relation Age of Onset  . Heart attack Mother   . Alcohol abuse Father   . CAD Brother     Past Surgical History:  Procedure Laterality Date  . BACK SURGERY    . CORONARY ARTERY BYPASS GRAFT  2008  . CORONARY BALLOON ANGIOPLASTY N/A 03/26/2017   Procedure: Coronary Balloon Angioplasty;  Surgeon: Tonny Bollman, MD;  Location: [facility] INVASIVE CV LAB;  Service: Cardiovascular;  Laterality: N/A;  . CORONARY STENT INTERVENTION N/A 03/26/2017   Procedure: Coronary Stent Intervention;  Surgeon: Tonny Bollman, MD;  Location: [facility] INVASIVE CV LAB;  Service: Cardiovascular;  Laterality: N/A;  . RIGHT/LEFT HEART CATH AND CORONARY/GRAFT ANGIOGRAPHY N/A 03/26/2017   Procedure: Right/Left Heart Cath and Coronary/Graft Angiography;  Surgeon: Tonny Bollman, MD;  Location: [facility] INVASIVE CV LAB;  Service: Cardiovascular;  Laterality: N/A;   Social History   Occupational History  . Not on file  Tobacco Use  . Smoking status: Current Every Day Smoker    Packs/day: 2.00    Years: 45.00    Pack years: 90.00    Types: Cigarettes  . Smokeless tobacco: Never Used  Substance and Sexual Activity  . Alcohol use: Yes    Comment: occasionally  . Drug use: No  . Sexual activity: Never

## 2018-02-08 ENCOUNTER — Other Ambulatory Visit: Payer: Self-pay | Admitting: Nurse Practitioner

## 2018-03-31 DIAGNOSIS — Z7951 Long term (current) use of inhaled steroids: Secondary | ICD-10-CM | POA: Diagnosis not present

## 2018-03-31 DIAGNOSIS — J449 Chronic obstructive pulmonary disease, unspecified: Secondary | ICD-10-CM | POA: Diagnosis not present

## 2018-03-31 DIAGNOSIS — I10 Essential (primary) hypertension: Secondary | ICD-10-CM | POA: Diagnosis not present

## 2018-03-31 DIAGNOSIS — H538 Other visual disturbances: Secondary | ICD-10-CM | POA: Diagnosis not present

## 2018-03-31 DIAGNOSIS — H00012 Hordeolum externum right lower eyelid: Secondary | ICD-10-CM | POA: Diagnosis not present

## 2018-03-31 DIAGNOSIS — H40052 Ocular hypertension, left eye: Secondary | ICD-10-CM | POA: Diagnosis not present

## 2018-03-31 DIAGNOSIS — E7800 Pure hypercholesterolemia, unspecified: Secondary | ICD-10-CM | POA: Diagnosis not present

## 2018-03-31 DIAGNOSIS — K219 Gastro-esophageal reflux disease without esophagitis: Secondary | ICD-10-CM | POA: Diagnosis not present

## 2018-03-31 DIAGNOSIS — Z79899 Other long term (current) drug therapy: Secondary | ICD-10-CM | POA: Diagnosis not present

## 2018-03-31 DIAGNOSIS — H00014 Hordeolum externum left upper eyelid: Secondary | ICD-10-CM | POA: Diagnosis not present

## 2018-03-31 DIAGNOSIS — Z9049 Acquired absence of other specified parts of digestive tract: Secondary | ICD-10-CM | POA: Diagnosis not present

## 2018-03-31 DIAGNOSIS — H53142 Visual discomfort, left eye: Secondary | ICD-10-CM | POA: Diagnosis not present

## 2018-03-31 DIAGNOSIS — H5712 Ocular pain, left eye: Secondary | ICD-10-CM | POA: Diagnosis not present

## 2018-03-31 DIAGNOSIS — Z951 Presence of aortocoronary bypass graft: Secondary | ICD-10-CM | POA: Diagnosis not present

## 2018-04-11 DIAGNOSIS — E785 Hyperlipidemia, unspecified: Secondary | ICD-10-CM | POA: Diagnosis not present

## 2018-04-11 DIAGNOSIS — E559 Vitamin D deficiency, unspecified: Secondary | ICD-10-CM | POA: Diagnosis not present

## 2018-04-11 DIAGNOSIS — I10 Essential (primary) hypertension: Secondary | ICD-10-CM | POA: Diagnosis not present

## 2018-04-12 ENCOUNTER — Other Ambulatory Visit: Payer: Self-pay | Admitting: Cardiovascular Disease

## 2018-04-12 DIAGNOSIS — E785 Hyperlipidemia, unspecified: Secondary | ICD-10-CM | POA: Diagnosis not present

## 2018-04-12 DIAGNOSIS — E559 Vitamin D deficiency, unspecified: Secondary | ICD-10-CM | POA: Diagnosis not present

## 2018-04-12 DIAGNOSIS — R202 Paresthesia of skin: Secondary | ICD-10-CM | POA: Diagnosis not present

## 2018-04-12 DIAGNOSIS — I10 Essential (primary) hypertension: Secondary | ICD-10-CM | POA: Diagnosis not present

## 2018-04-12 DIAGNOSIS — G2581 Restless legs syndrome: Secondary | ICD-10-CM | POA: Diagnosis not present

## 2018-05-15 ENCOUNTER — Encounter: Payer: Self-pay | Admitting: Cardiovascular Disease

## 2018-05-15 ENCOUNTER — Ambulatory Visit (INDEPENDENT_AMBULATORY_CARE_PROVIDER_SITE_OTHER): Payer: Medicare Other | Admitting: Cardiovascular Disease

## 2018-05-15 VITALS — BP 142/72 | HR 86 | Ht 69.0 in | Wt 197.0 lb

## 2018-05-15 DIAGNOSIS — E785 Hyperlipidemia, unspecified: Secondary | ICD-10-CM

## 2018-05-15 DIAGNOSIS — I10 Essential (primary) hypertension: Secondary | ICD-10-CM

## 2018-05-15 DIAGNOSIS — I25708 Atherosclerosis of coronary artery bypass graft(s), unspecified, with other forms of angina pectoris: Secondary | ICD-10-CM | POA: Diagnosis not present

## 2018-05-15 DIAGNOSIS — R0609 Other forms of dyspnea: Secondary | ICD-10-CM | POA: Diagnosis not present

## 2018-05-15 DIAGNOSIS — I350 Nonrheumatic aortic (valve) stenosis: Secondary | ICD-10-CM | POA: Diagnosis not present

## 2018-05-15 MED ORDER — ISOSORBIDE MONONITRATE ER 30 MG PO TB24: 30.0000 mg | ORAL_TABLET | Freq: Every day | ORAL | 3 refills | Status: DC

## 2018-05-15 MED ORDER — NITROGLYCERIN 0.4 MG SL SUBL: 0.4000 mg | SUBLINGUAL_TABLET | SUBLINGUAL | 3 refills | Status: DC | PRN

## 2018-05-15 NOTE — Progress Notes (Signed)
SUBJECTIVE: The patient presents for routine follow-up for coronary artery disease and aortic stenosis.  ABIs were unremarkable on 11/19/2017.  In summary, he was hospitalized for unstable angina in July 2018 and underwent drug-eluting stent placement of the proximal left circumflex and balloon angioplasty of obtuse marginal branches.  He has a history of CABG and coronary angiogram demonstrated patency of the LIMA to LAD and total occlusion of the saphenous vein graft to the distal RCA and OM branches.  The right PDA branch collateralized from the LAD visualized on the LIMA injections.  He was also found to have moderate aortic stenosis with a mean gradient of 24 mmHg and a peak to peak gradient of 34 mill meters mercury, with a calculated aortic valve area of 0.96 cm.  Echocardiogram on 03/24/17 demonstrated normal left ventricular systolic function and regional wall motion, LVEF 60-65%, mild LVH, grade 2 diastolic dysfunction, and moderate aortic stenosis.  He has been bothered by the hot and humid temperatures lately and has a difficult time being outside due to increasing shortness of breath.  He said he now smokes very little.  He has been using his inhalers more frequently.  He said there is been some confusion about his medications.  He has not stopped taking Plavix.  He does not have his nitroglycerin patch.  He said he reads and writes very little.  About a month ago he called EMS for chest pain.  He refused to go to the hospital.  He said he lost his nitroglycerin after a recent move.   Review of Systems: As per "subjective", otherwise negative.  No Known Allergies  Current Outpatient Medications  Medication Sig Dispense Refill  . albuterol (PROVENTIL HFA;VENTOLIN HFA) 108 (90 BASE) MCG/ACT inhaler Inhale 2 puffs into the lungs every 6 (six) hours as needed for wheezing or shortness of breath. 1 Inhaler 5  . aspirin EC 81 MG EC tablet Take 1 tablet (81 mg total) by  mouth daily. 30 tablet 6  . atorvastatin (LIPITOR) 80 MG tablet Take 1 tablet (80 mg total) by mouth daily. 90 tablet 3  . cephALEXin (KEFLEX) 500 MG capsule Take 1 capsule (500 mg total) 4 (four) times daily by mouth. 28 capsule 0  . Cholecalciferol (VITAMIN D3) 1000 units CAPS Take 1 capsule by mouth daily.     . clopidogrel (PLAVIX) 75 MG tablet TAKE 1 TABLET BY MOUTH EACH MORNING WITH BREAKFAST. 30 tablet 6  . esomeprazole (NEXIUM) 20 MG capsule Take 20 mg by mouth daily at 12 noon.    . fluticasone (FLONASE) 50 MCG/ACT nasal spray Place 1 spray into both nostrils daily.    . metoprolol succinate (TOPROL-XL) 50 MG 24 hr tablet TAKE ONE TABLET BY MOUTH ONCE DAILY. 30 tablet 6  . nicotine (NICODERM CQ - DOSED IN MG/24 HOURS) 21 mg/24hr patch Place 1 patch (21 mg total) onto the skin daily. 30 patch 1  . traMADol-acetaminophen (ULTRACET) 37.5-325 MG tablet Take 1 tablet by mouth every 4 (four) hours as needed. 30 tablet 0  . umeclidinium bromide (INCRUSE ELLIPTA) 62.5 MCG/INH AEPB Inhale 1 puff into the lungs daily.     Marland Kitchen losartan (COZAAR) 25 MG tablet Take 1 tablet (25 mg total) by mouth daily. 90 tablet 3  . nitroGLYCERIN (NITROSTAT) 0.4 MG SL tablet Place 1 tablet (0.4 mg total) under the tongue every 5 (five) minutes as needed for chest pain. 90 tablet 3   No current facility-administered medications for this visit.  Past Medical History:  Diagnosis Date  . Arthritis   . CAD (coronary artery disease)    a. s/p prior CABG x 3 9LIMA->LAD, VG->RPDA, VG->OM;  b. s/p prior stenting of OM1;  c. 03/2017 Cath/PCI: LAD 100, LCX 90p (3.5x20 Synergy DES), OM1 90 ISR (CBA), RCA 100p, LIMA->LAD ok, VG->OM 100, VG->RPDA 100.  Marland Kitchen GERD (gastroesophageal reflux disease)   . HLD (hyperlipidemia)   . HTN (hypertension)   . Moderate aortic stenosis    a. 03/2017 Echo: EF 60-65%, Gr2 DD, mod AS.  . Tobacco abuse     Past Surgical History:  Procedure Laterality Date  . BACK SURGERY    . CORONARY  ARTERY BYPASS GRAFT  2008  . CORONARY BALLOON ANGIOPLASTY N/A 03/26/2017   Procedure: Coronary Balloon Angioplasty;  Surgeon: Tonny Bollman, MD;  Location: [facility] INVASIVE CV LAB;  Service: Cardiovascular;  Laterality: N/A;  . CORONARY STENT INTERVENTION N/A 03/26/2017   Procedure: Coronary Stent Intervention;  Surgeon: Tonny Bollman, MD;  Location: [facility] INVASIVE CV LAB;  Service: Cardiovascular;  Laterality: N/A;  . RIGHT/LEFT HEART CATH AND CORONARY/GRAFT ANGIOGRAPHY N/A 03/26/2017   Procedure: Right/Left Heart Cath and Coronary/Graft Angiography;  Surgeon: Tonny Bollman, MD;  Location: [facility] INVASIVE CV LAB;  Service: Cardiovascular;  Laterality: N/A;    Social History   Socioeconomic History  . Marital status: Divorced    Spouse name: Not on file  . Number of children: Not on file  . Years of education: Not on file  . Highest education level: Not on file  Occupational History  . Not on file  Social Needs  . Financial resource strain: Not on file  . Food insecurity:    Worry: Not on file    Inability: Not on file  . Transportation needs:    Medical: Not on file    Non-medical: Not on file  Tobacco Use  . Smoking status: Current Every Day Smoker    Packs/day: 2.00    Years: 45.00    Pack years: 90.00    Types: Cigarettes  . Smokeless tobacco: Never Used  Substance and Sexual Activity  . Alcohol use: Yes    Comment: occasionally  . Drug use: No  . Sexual activity: Never  Lifestyle  . Physical activity:    Days per week: Not on file    Minutes per session: Not on file  . Stress: Not on file  Relationships  . Social connections:    Talks on phone: Not on file    Gets together: Not on file    Attends religious service: Not on file    Active member of club or organization: Not on file    Attends meetings of clubs or organizations: Not on file    Relationship status: Not on file  . Intimate partner violence:    Fear of current or ex partner: Not on file    Emotionally  abused: Not on file    Physically abused: Not on file    Forced sexual activity: Not on file  Other Topics Concern  . Not on file  Social History Narrative  . Not on file     Vitals:   05/15/18 1450  BP: (!) 142/72  Pulse: 86  SpO2: 97%  Weight: 197 lb (89.4 kg)  Height: 5\' 9"  (1.753 m)    Wt Readings from Last 3 Encounters:  05/15/18 197 lb (89.4 kg)  01/07/18 196 lb (88.9 kg)  11/27/17 195 lb (88.5 kg)     PHYSICAL  EXAM General: NAD HEENT: Normal. Neck: No JVD, no thyromegaly. Lungs: Clear to auscultation bilaterally with diminished respiratory effort. CV: Regular rate and rhythm, normal S1/S2, no S3/S4, 3/6 ejection systolic murmur heard throughout precordium but loudest over right upper sternal border. No pretibial or periankle edema.   Abdomen: Soft, nontender, no distention.  Neurologic: Alert and oriented.  Psych: Normal affect. Skin: Normal. Musculoskeletal: No gross deformities.    ECG: Reviewed above under Subjective   Labs: Lab Results  Component Value Date/Time   K 4.1 07/22/2017 01:17 PM   BUN 22 (H) 07/22/2017 01:17 PM   CREATININE 1.00 07/22/2017 01:17 PM   CREATININE 0.68 12/24/2013 09:59 AM   ALT 9 06/24/2013 11:19 AM   TSH 0.758 12/24/2013 09:59 AM   HGB 13.3 07/22/2017 01:17 PM     Lipids: Lab Results  Component Value Date/Time   LDLCALC 90 03/25/2017 05:19 AM   CHOL 168 03/25/2017 05:19 AM   TRIG 62 03/25/2017 05:19 AM   HDL 66 03/25/2017 05:19 AM       ASSESSMENT AND PLAN:  1.  Coronary artery disease with PCI in July 2018: He has been having more anginal symptoms.  Coronary angiogram details noted above.  Continue aspirin, Lipitor, and metoprolol succinate.  I will make sure Plavix is discontinued.  I will confirm with his pharmacy.  He lost his nitroglycerin so I will make certain he has a bottle.  I will also start Imdur 30 mg daily.  2.  Moderate aortic stenosis with increasing exertional dyspnea: He has had some worsening  shortness of breath which may be due to COPD and hot and humid temperatures.  I will obtain an echocardiogram to evaluate for a progression in the severity of aortic stenosis.  3.  Hyperlipidemia: I reviewed a lipid panel performed on 06/21/17 which showed total cholesterol 188, triglycerides 92, HDL 71, LDL 99.  I will check to see if a more recent lipid panel was obtained by PCP.   Continue Lipitor 80 mg daily.  4.  Hypertension: Blood pressure is mildly elevated.  I will monitor given the addition of Imdur 30 g. m  Disposition: Follow up 6 months   Prentice Docker, M.D., F.A.C.C.

## 2018-05-15 NOTE — Patient Instructions (Addendum)
Your physician wants you to follow-up in:6 months with Dr.Koneswaran You will receive a reminder letter in the mail two months in advance. If you don't receive a letter, please call our office to schedule the follow-up appointment.    START Imdur 30 mg daily  START Aspirin 81 mg daily  Re-start Nicoderm patches as prescribed      Your physician has requested that you have an echocardiogram. Echocardiography is a painless test that uses sound waves to create images of your heart. It provides your doctor with information about the size and shape of your heart and how well your heart's chambers and valves are working. This procedure takes approximately one hour. There are no restrictions for this procedure.    Thank you for choosing [facility] !

## 2018-05-23 ENCOUNTER — Ambulatory Visit (HOSPITAL_COMMUNITY)
Admission: RE | Admit: 2018-05-23 | Discharge: 2018-05-23 | Disposition: A | Discharge status: Home / Self Care | Payer: Medicare Other | Source: Ambulatory Visit | Attending: Cardiovascular Disease | Admitting: Cardiovascular Disease

## 2018-05-23 DIAGNOSIS — J449 Chronic obstructive pulmonary disease, unspecified: Secondary | ICD-10-CM | POA: Insufficient documentation

## 2018-05-23 DIAGNOSIS — I350 Nonrheumatic aortic (valve) stenosis: Secondary | ICD-10-CM | POA: Diagnosis not present

## 2018-05-23 DIAGNOSIS — E785 Hyperlipidemia, unspecified: Secondary | ICD-10-CM | POA: Insufficient documentation

## 2018-05-23 DIAGNOSIS — Z720 Tobacco use: Secondary | ICD-10-CM | POA: Diagnosis not present

## 2018-05-23 DIAGNOSIS — I10 Essential (primary) hypertension: Secondary | ICD-10-CM | POA: Insufficient documentation

## 2018-05-23 DIAGNOSIS — I2510 Atherosclerotic heart disease of native coronary artery without angina pectoris: Secondary | ICD-10-CM | POA: Insufficient documentation

## 2018-05-23 NOTE — Progress Notes (Signed)
*  PRELIMINARY RESULTS* Echocardiogram 2D Echocardiogram has been performed.  Stacey DrainWhite, Stephinie Battisti J 05/23/2018, 2:42 PM

## 2018-05-27 ENCOUNTER — Telehealth: Payer: Self-pay

## 2018-05-27 NOTE — Telephone Encounter (Signed)
Called pt. Non answer. Left message for pt to return call.  

## 2018-05-28 NOTE — Telephone Encounter (Signed)
LVM RETURNING CALL  [phone]

## 2018-07-23 DIAGNOSIS — E785 Hyperlipidemia, unspecified: Secondary | ICD-10-CM | POA: Diagnosis not present

## 2018-07-23 DIAGNOSIS — I10 Essential (primary) hypertension: Secondary | ICD-10-CM | POA: Diagnosis not present

## 2018-07-25 DIAGNOSIS — J449 Chronic obstructive pulmonary disease, unspecified: Secondary | ICD-10-CM | POA: Diagnosis not present

## 2018-07-25 DIAGNOSIS — Z23 Encounter for immunization: Secondary | ICD-10-CM | POA: Diagnosis not present

## 2018-07-25 DIAGNOSIS — Z79899 Other long term (current) drug therapy: Secondary | ICD-10-CM | POA: Diagnosis not present

## 2018-07-25 DIAGNOSIS — I10 Essential (primary) hypertension: Secondary | ICD-10-CM | POA: Diagnosis not present

## 2018-07-25 DIAGNOSIS — E785 Hyperlipidemia, unspecified: Secondary | ICD-10-CM | POA: Diagnosis not present

## 2018-08-07 DIAGNOSIS — I700 Atherosclerosis of aorta: Secondary | ICD-10-CM | POA: Diagnosis not present

## 2018-08-07 DIAGNOSIS — M47816 Spondylosis without myelopathy or radiculopathy, lumbar region: Secondary | ICD-10-CM | POA: Diagnosis not present

## 2018-08-07 DIAGNOSIS — Z79899 Other long term (current) drug therapy: Secondary | ICD-10-CM | POA: Diagnosis not present

## 2018-08-07 DIAGNOSIS — I10 Essential (primary) hypertension: Secondary | ICD-10-CM | POA: Diagnosis not present

## 2018-08-07 DIAGNOSIS — J449 Chronic obstructive pulmonary disease, unspecified: Secondary | ICD-10-CM | POA: Diagnosis not present

## 2018-08-07 DIAGNOSIS — E7800 Pure hypercholesterolemia, unspecified: Secondary | ICD-10-CM | POA: Diagnosis not present

## 2018-08-07 DIAGNOSIS — M4726 Other spondylosis with radiculopathy, lumbar region: Secondary | ICD-10-CM | POA: Diagnosis not present

## 2018-08-07 DIAGNOSIS — K219 Gastro-esophageal reflux disease without esophagitis: Secondary | ICD-10-CM | POA: Diagnosis not present

## 2018-08-07 DIAGNOSIS — M62830 Muscle spasm of back: Secondary | ICD-10-CM | POA: Diagnosis not present

## 2018-08-07 DIAGNOSIS — Z951 Presence of aortocoronary bypass graft: Secondary | ICD-10-CM | POA: Diagnosis not present

## 2018-08-08 DIAGNOSIS — M5441 Lumbago with sciatica, right side: Secondary | ICD-10-CM | POA: Diagnosis not present

## 2018-08-08 DIAGNOSIS — R202 Paresthesia of skin: Secondary | ICD-10-CM | POA: Diagnosis not present

## 2018-08-08 DIAGNOSIS — M545 Low back pain: Secondary | ICD-10-CM | POA: Diagnosis not present

## 2018-08-09 DIAGNOSIS — E7800 Pure hypercholesterolemia, unspecified: Secondary | ICD-10-CM | POA: Diagnosis not present

## 2018-08-09 DIAGNOSIS — Z79899 Other long term (current) drug therapy: Secondary | ICD-10-CM | POA: Diagnosis not present

## 2018-08-09 DIAGNOSIS — J449 Chronic obstructive pulmonary disease, unspecified: Secondary | ICD-10-CM | POA: Diagnosis not present

## 2018-08-09 DIAGNOSIS — I10 Essential (primary) hypertension: Secondary | ICD-10-CM | POA: Diagnosis not present

## 2018-08-09 DIAGNOSIS — M5441 Lumbago with sciatica, right side: Secondary | ICD-10-CM | POA: Diagnosis not present

## 2018-08-09 DIAGNOSIS — Z7951 Long term (current) use of inhaled steroids: Secondary | ICD-10-CM | POA: Diagnosis not present

## 2018-08-09 DIAGNOSIS — M5431 Sciatica, right side: Secondary | ICD-10-CM | POA: Diagnosis not present

## 2018-08-11 ENCOUNTER — Emergency Department (HOSPITAL_COMMUNITY): Payer: Medicare Other

## 2018-08-11 ENCOUNTER — Other Ambulatory Visit: Payer: Self-pay

## 2018-08-11 ENCOUNTER — Encounter (HOSPITAL_COMMUNITY): Payer: Self-pay | Admitting: Emergency Medicine

## 2018-08-11 ENCOUNTER — Emergency Department (HOSPITAL_COMMUNITY)
Admission: EM | Admit: 2018-08-11 | Discharge: 2018-08-11 | Disposition: A | Discharge status: Home / Self Care | Payer: Medicare Other | Attending: Emergency Medicine | Admitting: Emergency Medicine

## 2018-08-11 DIAGNOSIS — I2510 Atherosclerotic heart disease of native coronary artery without angina pectoris: Secondary | ICD-10-CM | POA: Insufficient documentation

## 2018-08-11 DIAGNOSIS — I10 Essential (primary) hypertension: Secondary | ICD-10-CM | POA: Insufficient documentation

## 2018-08-11 DIAGNOSIS — Z79899 Other long term (current) drug therapy: Secondary | ICD-10-CM | POA: Insufficient documentation

## 2018-08-11 DIAGNOSIS — R509 Fever, unspecified: Secondary | ICD-10-CM | POA: Diagnosis not present

## 2018-08-11 DIAGNOSIS — Z87891 Personal history of nicotine dependence: Secondary | ICD-10-CM | POA: Diagnosis not present

## 2018-08-11 DIAGNOSIS — Z7982 Long term (current) use of aspirin: Secondary | ICD-10-CM | POA: Diagnosis not present

## 2018-08-11 DIAGNOSIS — R609 Edema, unspecified: Secondary | ICD-10-CM | POA: Diagnosis not present

## 2018-08-11 DIAGNOSIS — J449 Chronic obstructive pulmonary disease, unspecified: Secondary | ICD-10-CM | POA: Insufficient documentation

## 2018-08-11 DIAGNOSIS — M79604 Pain in right leg: Secondary | ICD-10-CM | POA: Diagnosis not present

## 2018-08-11 DIAGNOSIS — M79651 Pain in right thigh: Secondary | ICD-10-CM | POA: Diagnosis not present

## 2018-08-11 DIAGNOSIS — R52 Pain, unspecified: Secondary | ICD-10-CM | POA: Diagnosis not present

## 2018-08-11 DIAGNOSIS — R5381 Other malaise: Secondary | ICD-10-CM | POA: Diagnosis not present

## 2018-08-11 MED ORDER — HYDROCODONE-ACETAMINOPHEN 5-325 MG PO TABS: 2.0000 | ORAL_TABLET | Freq: Four times a day (QID) | ORAL | 0 refills | Status: DC | PRN

## 2018-08-11 MED ORDER — HYDROCODONE-ACETAMINOPHEN 5-325 MG PO TABS
1.0000 | ORAL_TABLET | Freq: Once | ORAL | Status: AC
  Administered 2018-08-11: 1 via ORAL
  Filled 2018-08-11: qty 1

## 2018-08-11 NOTE — ED Provider Notes (Addendum)
[facility] EMERGENCY DEPARTMENT Provider Note   CSN: [id number] Arrival date & time: 08/11/18  1604     History   Chief Complaint Chief Complaint  Patient presents with  . Leg Pain    HPI Craig Harmon is a 67 y.o. male.  Patient without any history of injury.  But with complaint of right anterior thigh pain down to the knee but not including the knee.  Patient saw primary care doctor they thought maybe it was related to a femoral nerve radiculopathy and he was started on steroids.  He started those a few days ago.  He had no improvement.  The discomfort is isolated to the anterior thigh.     Past Medical History:  Diagnosis Date  . Arthritis   . CAD (coronary artery disease)    a. s/p prior CABG x 3 9LIMA->LAD, VG->RPDA, VG->OM;  b. s/p prior stenting of OM1;  c. 03/2017 Cath/PCI: LAD 100, LCX 90p (3.5x20 Synergy DES), OM1 90 ISR (CBA), RCA 100p, LIMA->LAD ok, VG->OM 100, VG->RPDA 100.  Marland Kitchen GERD (gastroesophageal reflux disease)   . HLD (hyperlipidemia)   . HTN (hypertension)   . Moderate aortic stenosis    a. 03/2017 Echo: EF 60-65%, Gr2 DD, mod AS.  . Tobacco abuse     Patient Active Problem List   Diagnosis Date Noted  . Nonrheumatic aortic valve stenosis   . Status post coronary artery stent placement   . Malnutrition of moderate degree 03/24/2017  . Unstable angina (HCC) 03/23/2017  . Fall at home 12/24/2013  . Left hip pain 12/24/2013  . Back pain, lumbosacral 12/24/2013  . Loss of weight 12/24/2013  . Right ear pain 12/24/2013  . Lump in neck 12/24/2013  . Tobacco abuse 12/24/2013  . Hearing loss in right ear 12/10/2013  . Ear drainage right 12/10/2013  . Chronic left hip pain 12/10/2013  . Otitis externa of right ear 12/10/2013  . Submandibular sialoadenitis 10/09/2013  . Mass of right submandibular region 10/09/2013  . COPD bronchitis 10/09/2013  . Difficulty swallowing 10/09/2013  . Unintentional weight loss of more than 10 pounds in 90 days  10/09/2013  . 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 HTN (hypertension) 06/24/2013  . COPD (chronic obstructive pulmonary disease) (HCC) 06/24/2013  . CAD (coronary artery disease), bypass graft transplanted heart 06/24/2013  . HLD (hyperlipidemia) 06/24/2013  . Tobacco use disorder 06/24/2013    Past Surgical History:  Procedure Laterality Date  . BACK SURGERY    . CORONARY ARTERY BYPASS GRAFT  2008  . CORONARY BALLOON ANGIOPLASTY N/A 03/26/2017   Procedure: Coronary Balloon Angioplasty;  Surgeon: Tonny Bollman, MD;  Location: [facility] INVASIVE CV LAB;  Service: Cardiovascular;  Laterality: N/A;  . CORONARY STENT INTERVENTION N/A 03/26/2017   Procedure: Coronary Stent Intervention;  Surgeon: Tonny Bollman, MD;  Location: [facility] INVASIVE CV LAB;  Service: Cardiovascular;  Laterality: N/A;  . RIGHT/LEFT HEART CATH AND CORONARY/GRAFT ANGIOGRAPHY N/A 03/26/2017   Procedure: Right/Left Heart Cath and Coronary/Graft Angiography;  Surgeon: Tonny Bollman, MD;  Location: [facility] INVASIVE CV LAB;  Service: Cardiovascular;  Laterality: N/A;        Home Medications    Prior to Admission medications   Medication Sig Start Date End Date Taking? Authorizing Provider  nicotine (NICODERM CQ - DOSED IN MG/24 HOURS) 21 mg/24hr patch Place 1 patch (21 mg total) onto the skin daily. 03/28/17  Yes Creig Hines, NP  albuterol (PROVENTIL HFA;VENTOLIN HFA) 108 (90 BASE) MCG/ACT inhaler Inhale 2 puffs into  the lungs every 6 (six) hours as needed for wheezing or shortness of breath. 10/09/13   Kela Millin, MD  aspirin EC 81 MG EC tablet Take 1 tablet (81 mg total) by mouth daily. 03/28/17   Creig Hines, NP  atorvastatin (LIPITOR) 80 MG tablet Take 1 tablet (80 mg total) by mouth daily. 11/14/17 05/15/18  Laqueta Linden, MD  Cholecalciferol (VITAMIN D3) 1000 units CAPS Take 1 capsule by mouth daily.     [provider]  clopidogrel (PLAVIX) 75 MG tablet TAKE 1 TABLET BY MOUTH  EACH MORNING WITH BREAKFAST. 04/12/18   Laqueta Linden, MD  esomeprazole (NEXIUM) 20 MG capsule Take 20 mg by mouth daily at 12 noon.    [provider]  fluticasone (FLONASE) 50 MCG/ACT nasal spray Place 1 spray into both nostrils daily.    [provider]  HYDROcodone-acetaminophen (NORCO/VICODIN) 5-325 MG tablet Take 2 tablets by mouth every 6 (six) hours as needed. 08/11/18   Vanetta Mulders, MD  isosorbide mononitrate (IMDUR) 30 MG 24 hr tablet Take 1 tablet (30 mg total) by mouth daily. 05/15/18 08/13/18  Laqueta Linden, MD  losartan (COZAAR) 25 MG tablet Take 1 tablet (25 mg total) by mouth daily. 11/14/17 02/12/18  Laqueta Linden, MD  metoprolol succinate (TOPROL-XL) 50 MG 24 hr tablet TAKE ONE TABLET BY MOUTH ONCE DAILY. 04/12/18   Laqueta Linden, MD  nitroGLYCERIN (NITROSTAT) 0.4 MG SL tablet Place 1 tablet (0.4 mg total) under the tongue every 5 (five) minutes as needed for chest pain. 05/15/18 08/13/18  Laqueta Linden, MD  traMADol-acetaminophen (ULTRACET) 37.5-325 MG tablet Take 1 tablet by mouth every 4 (four) hours as needed. 11/27/17   Vickki Hearing, MD  umeclidinium bromide (INCRUSE ELLIPTA) 62.5 MCG/INH AEPB Inhale 1 puff into the lungs daily.  08/12/15   [provider]    Family History Family History  Problem Relation Age of Onset  . Heart attack Mother   . Alcohol abuse Father   . CAD Brother     Social History Social History   Tobacco Use  . Smoking status: Former Smoker    Packs/day: 2.00    Years: 45.00    Pack years: 90.00    Types: Cigarettes    Last attempt to quit: 08/11/2016    Years since quitting: 2.0  . Smokeless tobacco: Never Used  Substance Use Topics  . Alcohol use: Yes    Comment: occasionally  . Drug use: No     Allergies   Patient has no known allergies.   Review of Systems Review of Systems  Constitutional: Negative for fever.  HENT: Negative for congestion.   Eyes: Negative for  visual disturbance.  Respiratory: Negative for shortness of breath.   Cardiovascular: Negative for chest pain.  Gastrointestinal: Negative for abdominal pain.  Genitourinary: Negative for dysuria.  Musculoskeletal: Negative for back pain and neck pain.  Skin: Negative for rash.  Neurological: Negative for syncope.  Hematological: Does not bruise/bleed easily.  Psychiatric/Behavioral: Negative for confusion.     Physical Exam Updated Vital Signs BP 113/86   Pulse 62   Temp 97.6 F (36.4 C) (Oral)   Resp 20   SpO2 99%   Physical Exam  Constitutional: He is oriented to person, place, and time. He appears well-developed and well-nourished. No distress.  HENT:  Head: Normocephalic and atraumatic.  Mouth/Throat: Oropharynx is clear and moist.  Eyes: Pupils are equal, round, and reactive to light. Conjunctivae and  EOM are normal.  Neck: Neck supple.  Cardiovascular: Normal rate, regular rhythm and normal heart sounds.  Pulmonary/Chest: Effort normal and breath sounds normal. No respiratory distress.  Abdominal: Soft. Bowel sounds are normal. There is no tenderness.  Musculoskeletal: Normal range of motion. He exhibits tenderness. He exhibits no edema or deformity.  Patient dorsalis pedis pulse to the right leg is 2+.  No swelling to the knee patellar tendon is tender to palpation proximal to the kneecap itself.  Kneecap itself is not dislocated.  There is no erythema also some tenderness to palpation to the anterior part of the quadriceps muscle.  No erythema.  No swelling no tight compartment.  Neurological: He is alert and oriented to person, place, and time. No cranial nerve deficit or sensory deficit. He exhibits normal muscle tone. Coordination normal.  Skin: Skin is warm.  Nursing note and vitals reviewed.    ED Treatments / Results  Labs (all labs ordered are listed, but only abnormal results are displayed) Labs Reviewed - No data to display  EKG None  Radiology Dg  Femur Min 2 Views Right  Result Date: 08/11/2018 CLINICAL DATA:  Right inner thigh pain.  No injury. EXAM: RIGHT FEMUR 2 VIEWS COMPARISON:  None. FINDINGS: There is no evidence of fracture or other focal bone lesions. Soft tissues are unremarkable. IMPRESSION: Negative. Electronically Signed   By: Kennith CenterEric  Mansell M.D.   On: 08/11/2018 18:15    Procedures Procedures (including critical care time)  Medications Ordered in ED Medications  HYDROcodone-acetaminophen (NORCO/VICODIN) 5-325 MG per tablet 1 tablet (1 tablet Oral Given 08/11/18 1730)     Initial Impression / Assessment and Plan / ED Course  I have reviewed the triage vital signs and the nursing notes.  Pertinent labs & imaging results that were available during my care of the patient were reviewed by me and considered in my medical decision making (see chart for details).     Patient on exam with tenderness to palpation to the distal quadriceps tendon proximal to the patella and also tenderness to palpation to the quadricep muscle.  No skin changes no erythema no swelling no tightness to the compartment.  Dorsalis pedis pulse distally is 2+.  No lower extremity symptoms below the knee.  No posterior thigh discomfort.  I think this may be muscular in nature femoral nerve radiculopathy not completely ruled out.  But is also possible patient could have some injury to the quadriceps proximal tendon or the muscle itself.  We will treat with knee immobilizer pain medicine and follow-up with orthopedics.  Patient nontoxic no acute distress.  Final Clinical Impressions(s) / ED Diagnoses   Final diagnoses:  Right leg pain    ED Discharge Orders         Ordered    HYDROcodone-acetaminophen (NORCO/VICODIN) 5-325 MG tablet  Every 6 hours PRN     08/11/18 1941           Vanetta MuldersZackowski, Rolly Magri, MD 08/11/18 1953    Vanetta MuldersZackowski, Johann Santone, MD 08/11/18 1954

## 2018-08-11 NOTE — ED Triage Notes (Signed)
Pt c/o pain above right knee x 5 days. Denies injury or fall. Pt seen pcp and was told the pain was coming from his back. Pt denies any pain in back.

## 2018-08-11 NOTE — Discharge Instructions (Addendum)
Take the hydrocodone as needed for pain.  Wear the leg immobilizer at all times except for when showering.  Make an appointment to follow-up with orthopedics call tomorrow to set up the appointment.

## 2018-08-12 ENCOUNTER — Ambulatory Visit (INDEPENDENT_AMBULATORY_CARE_PROVIDER_SITE_OTHER): Payer: Medicare Other | Admitting: Orthopedic Surgery

## 2018-08-12 ENCOUNTER — Telehealth: Payer: Self-pay | Admitting: Orthopedic Surgery

## 2018-08-12 ENCOUNTER — Encounter: Payer: Self-pay | Admitting: Orthopedic Surgery

## 2018-08-12 VITALS — BP 98/71 | HR 82 | Ht 68.0 in | Wt 198.0 lb

## 2018-08-12 DIAGNOSIS — M79604 Pain in right leg: Secondary | ICD-10-CM | POA: Diagnosis not present

## 2018-08-12 MED ORDER — HYDROCODONE-ACETAMINOPHEN 5-325 MG PO TABS: 1.0000 | ORAL_TABLET | Freq: Four times a day (QID) | ORAL | 0 refills | Status: AC | PRN

## 2018-08-12 MED ORDER — METHYLPREDNISOLONE ACETATE 40 MG/ML IJ SUSP
40.0000 mg | Freq: Once | INTRAMUSCULAR | Status: AC
  Administered 2018-08-12: 40 mg via INTRAMUSCULAR

## 2018-08-12 MED FILL — Hydrocodone-Acetaminophen Tab 5-325 MG: ORAL | Qty: 6 | Status: AC

## 2018-08-12 NOTE — Progress Notes (Signed)
NEW PROBLEM OFFICE VISIT  Chief Complaint  Patient presents with  . Knee Pain    Rt knee since 08/07/18. No known injury.    67 year old male presented to the ER yesterday with a 4-day history of pain in his right leg he does have a history of lumbar surgery 14 years ago complains of pain in his right thigh radiating to his right knee and also complains of independent right knee pain  He was seen by his primary care physician who placed him on thyroid steroids thinking he had a femoral nerve radiculopathy.  He did not improve hence going to the ER  He has multiple medical problems as listed below he comes in with a knee immobilizer which I believe was given to keep his leg from giving way which she also complains of.  He describes a burning pain   Review of Systems  Constitutional: Negative for chills, fever, malaise/fatigue and weight loss.  Musculoskeletal: Negative for back pain.  Skin: Negative for itching and rash.  Neurological: Negative for sensory change, focal weakness and weakness.     Past Medical History:  Diagnosis Date  . Arthritis   . CAD (coronary artery disease)    a. s/p prior CABG x 3 9LIMA->LAD, VG->RPDA, VG->OM;  b. s/p prior stenting of OM1;  c. 03/2017 Cath/PCI: LAD 100, LCX 90p (3.5x20 Synergy DES), OM1 90 ISR (CBA), RCA 100p, LIMA->LAD ok, VG->OM 100, VG->RPDA 100.  Marland Kitchen GERD (gastroesophageal reflux disease)   . HLD (hyperlipidemia)   . HTN (hypertension)   . Moderate aortic stenosis    a. 03/2017 Echo: EF 60-65%, Gr2 DD, mod AS.  . Tobacco abuse     Past Surgical History:  Procedure Laterality Date  . BACK SURGERY    . CORONARY ARTERY BYPASS GRAFT  2008  . CORONARY BALLOON ANGIOPLASTY N/A 03/26/2017   Procedure: Coronary Balloon Angioplasty;  Surgeon: Tonny Bollman, MD;  Location: [facility] INVASIVE CV LAB;  Service: Cardiovascular;  Laterality: N/A;  . CORONARY STENT INTERVENTION N/A 03/26/2017   Procedure: Coronary Stent Intervention;  Surgeon: Tonny Bollman, MD;  Location: [facility] INVASIVE CV LAB;  Service: Cardiovascular;  Laterality: N/A;  . RIGHT/LEFT HEART CATH AND CORONARY/GRAFT ANGIOGRAPHY N/A 03/26/2017   Procedure: Right/Left Heart Cath and Coronary/Graft Angiography;  Surgeon: Tonny Bollman, MD;  Location: [facility] INVASIVE CV LAB;  Service: Cardiovascular;  Laterality: N/A;    Family History  Problem Relation Age of Onset  . Heart attack Mother   . Alcohol abuse Father   . CAD Brother    Social History   Tobacco Use  . Smoking status: Former Smoker    Packs/day: 2.00    Years: 45.00    Pack years: 90.00    Types: Cigarettes    Last attempt to quit: 08/11/2016    Years since quitting: 2.0  . Smokeless tobacco: Never Used  Substance Use Topics  . Alcohol use: Yes    Comment: occasionally  . Drug use: No    No Known Allergies  Current Meds  Medication Sig  . albuterol (PROVENTIL HFA;VENTOLIN HFA) 108 (90 BASE) MCG/ACT inhaler Inhale 2 puffs into the lungs every 6 (six) hours as needed for wheezing or shortness of breath.  Marland Kitchen aspirin EC 81 MG EC tablet Take 1 tablet (81 mg total) by mouth daily.  . Cholecalciferol (VITAMIN D3) 1000 units CAPS Take 1 capsule by mouth daily.   . clopidogrel (PLAVIX) 75 MG tablet TAKE 1 TABLET BY MOUTH EACH MORNING WITH BREAKFAST.  Marland Kitchen  esomeprazole (NEXIUM) 20 MG capsule Take 20 mg by mouth daily at 12 noon.  . fluticasone (FLONASE) 50 MCG/ACT nasal spray Place 1 spray into both nostrils daily.  . isosorbide mononitrate (IMDUR) 30 MG 24 hr tablet Take 1 tablet (30 mg total) by mouth daily.  . metoprolol succinate (TOPROL-XL) 50 MG 24 hr tablet TAKE ONE TABLET BY MOUTH ONCE DAILY.  . nitroGLYCERIN (NITROSTAT) 0.4 MG SL tablet Place 1 tablet (0.4 mg total) under the tongue every 5 (five) minutes as needed for chest pain.  Marland Kitchen. umeclidinium bromide (INCRUSE ELLIPTA) 62.5 MCG/INH AEPB Inhale 1 puff into the lungs daily.   . [DISCONTINUED] HYDROcodone-acetaminophen (NORCO/VICODIN) 5-325 MG tablet Take 2  tablets by mouth every 6 (six) hours as needed.    BP 98/71   Pulse 82   Ht 5\' 8"  (1.727 m)   Wt 198 lb (89.8 kg)   BMI 30.11 kg/m   Physical Exam  Constitutional: He is oriented to person, place, and time. He appears well-developed and well-nourished.  Neurological: He is alert and oriented to person, place, and time.  Psychiatric: He has a normal mood and affect. His behavior is normal. Judgment and thought content normal.    Ortho Exam  He does not have any back tenderness he has a negative straight leg raise he was seated and I could not do a femoral nerve stretch test  His left lower extremity had normal range of motion stability strength no tenderness swelling neurovascular exam was intact  His right knee had mild tenderness over the joint lines no effusion he could bend and straighten it normally.  There was no instability.  He had no sensory loss distally.  Pulses are intact.  Skin was normal.  MEDICAL DECISION SECTION  X[facility] were done at [facility]  My independent reading of xrays:  Femoral x-rays were normal  Encounter Diagnosis  Name Primary?  . Right leg pain Yes    PLAN: (Rx., injectx, surgery, frx, mri/ct) Unclear diagnosis is this a femoral nerve radicular symptoms from his back is this knee pain his hip exam was normal.  Recommend IM cortisone shot 40 mg Depo-Medrol which was given by the nurse  Procedure sterile technique site confirmation verbal consent 40 mg IM Depo-Medrol injected right hip  Follow-up as needed medication refilled ONCE  Meds ordered this encounter  Medications  . HYDROcodone-acetaminophen (NORCO/VICODIN) 5-325 MG tablet    Sig: Take 1 tablet by mouth every 6 (six) hours as needed for up to 5 days for moderate pain.    Dispense:  20 tablet    Refill:  0    Fuller CanadaStanley Harrison, MD  08/12/2018 2:34 PM

## 2018-08-12 NOTE — Addendum Note (Signed)
Addended by: Baird KayUGLAS, Tifany Hirsch M on: 08/12/2018 02:42 PM   Modules accepted: Orders

## 2018-08-12 NOTE — Patient Instructions (Signed)
Use the cane at all times when walking

## 2018-08-12 NOTE — Telephone Encounter (Signed)
Call received from patient, and also friend, whom he put onto the call, relaying that he was treated at [facility] for right leg/knee problem; states knee feels hot. States difficulty walking but will get ride and make it in. Appointment scheduled in open slot for today; aware.

## 2018-08-13 ENCOUNTER — Other Ambulatory Visit (HOSPITAL_COMMUNITY): Payer: Self-pay | Admitting: Adult Health

## 2018-08-13 DIAGNOSIS — M5441 Lumbago with sciatica, right side: Secondary | ICD-10-CM

## 2018-08-16 ENCOUNTER — Encounter (HOSPITAL_COMMUNITY): Payer: Self-pay

## 2018-08-16 ENCOUNTER — Emergency Department (HOSPITAL_COMMUNITY)
Admission: EM | Admit: 2018-08-16 | Discharge: 2018-08-17 | Disposition: A | Discharge status: Home / Self Care | Payer: Medicare Other | Attending: Emergency Medicine | Admitting: Emergency Medicine

## 2018-08-16 ENCOUNTER — Ambulatory Visit (HOSPITAL_COMMUNITY)
Admission: RE | Admit: 2018-08-16 | Discharge: 2018-08-16 | Disposition: A | Discharge status: Home / Self Care | Payer: Medicare Other | Source: Ambulatory Visit | Attending: Adult Health | Admitting: Adult Health

## 2018-08-16 DIAGNOSIS — Z87891 Personal history of nicotine dependence: Secondary | ICD-10-CM | POA: Diagnosis not present

## 2018-08-16 DIAGNOSIS — J449 Chronic obstructive pulmonary disease, unspecified: Secondary | ICD-10-CM | POA: Insufficient documentation

## 2018-08-16 DIAGNOSIS — E785 Hyperlipidemia, unspecified: Secondary | ICD-10-CM | POA: Insufficient documentation

## 2018-08-16 DIAGNOSIS — Z7982 Long term (current) use of aspirin: Secondary | ICD-10-CM | POA: Insufficient documentation

## 2018-08-16 DIAGNOSIS — M5441 Lumbago with sciatica, right side: Secondary | ICD-10-CM | POA: Insufficient documentation

## 2018-08-16 DIAGNOSIS — Z79899 Other long term (current) drug therapy: Secondary | ICD-10-CM | POA: Insufficient documentation

## 2018-08-16 DIAGNOSIS — I2510 Atherosclerotic heart disease of native coronary artery without angina pectoris: Secondary | ICD-10-CM | POA: Diagnosis not present

## 2018-08-16 DIAGNOSIS — I10 Essential (primary) hypertension: Secondary | ICD-10-CM | POA: Insufficient documentation

## 2018-08-16 DIAGNOSIS — Z7902 Long term (current) use of antithrombotics/antiplatelets: Secondary | ICD-10-CM | POA: Diagnosis not present

## 2018-08-16 DIAGNOSIS — M545 Low back pain: Secondary | ICD-10-CM | POA: Diagnosis present

## 2018-08-16 NOTE — ED Triage Notes (Signed)
Pt complaining of right sided back down to left leg. Says he was suppose to have an MRI today but he could not lay on table in order to receive test today.

## 2018-08-17 ENCOUNTER — Emergency Department (HOSPITAL_COMMUNITY): Payer: Medicare Other

## 2018-08-17 ENCOUNTER — Other Ambulatory Visit: Payer: Self-pay

## 2018-08-17 DIAGNOSIS — M5441 Lumbago with sciatica, right side: Secondary | ICD-10-CM | POA: Diagnosis not present

## 2018-08-17 MED ORDER — LIDOCAINE 5 % EX PTCH
1.0000 | MEDICATED_PATCH | CUTANEOUS | Status: DC
  Administered 2018-08-17: 1 via TRANSDERMAL
  Filled 2018-08-17: qty 1

## 2018-08-17 MED ORDER — LORAZEPAM 1 MG PO TABS
1.0000 mg | ORAL_TABLET | Freq: Once | ORAL | Status: AC
  Administered 2018-08-17: 1 mg via ORAL
  Filled 2018-08-17: qty 1

## 2018-08-17 MED ORDER — OXYCODONE-ACETAMINOPHEN 5-325 MG PO TABS
1.0000 | ORAL_TABLET | Freq: Once | ORAL | Status: AC
  Administered 2018-08-17: 1 via ORAL
  Filled 2018-08-17: qty 1

## 2018-08-17 MED ORDER — METHOCARBAMOL 500 MG PO TABS: 500.0000 mg | ORAL_TABLET | Freq: Two times a day (BID) | ORAL | 0 refills | Status: DC

## 2018-08-17 MED ORDER — LIDOCAINE 5 % EX PTCH: 1.0000 | MEDICATED_PATCH | CUTANEOUS | 0 refills | Status: DC

## 2018-08-17 NOTE — Discharge Instructions (Addendum)
You have been diagnosed today with right-sided lower back pain with sciatica of the right side.  At this time there does not appear to be the presence of an emergent medical condition, however there is always the potential for conditions to change. Please read and follow the below instructions.  Please return to the Emergency Department immediately for any new or worsening symptoms or if your symptoms do not improve within 1 week. Please be sure to follow up with your Primary Care Provider on Monday regarding your visit today; please call their office to schedule an appointment even if you are feeling better for a follow-up visit. You may use the muscle relaxing medication Robaxin as prescribed to help with your symptoms.  Do not drive or operate machinery will take this medication because it may make you drowsy. You may use the Lidoderm patch as prescribed to help with your symptoms. You may use over-the-counter anti-inflammatory medications such as Tylenol/ibuprofen as directed on the packaging to help with your symptoms. You have elected to forego your MRI today to evaluate because of your back/leg pain.  You have elected to pursue conservative medication therapy today to help with your symptoms.  You may seek further evaluation for your back pain/leg pain at any time either by your primary care provider or here at the emergency department. Please call your primary doctor and/or orthopedist Monday for further evaluation.  Get help right away if: You lose control of your bowel or bladder (incontinence).Your pain and other symptoms get worse. Your pain medicine is not helping. Your pain has not improved after a week of home care. You have a fever. You have: Weakness in your lower back, pelvis, buttocks, or legs that gets worse. Redness or swelling of your back. A burning sensation when you urinate.  Please read the additional information packets attached to your discharge summary.  Do not  take your medicine if  develop an itchy rash, swelling in your mouth or lips, or difficulty breathing.

## 2018-08-17 NOTE — ED Provider Notes (Signed)
[facility] EMERGENCY DEPARTMENT Provider Note   CSN: [id number] Arrival date & time: 08/16/18  2258     History   Chief Complaint No chief complaint on file.   HPI Craig Harmon is a 67 y.o. male presenting today for right-sided lower back pain and right thigh pain that has been present for the past 2 weeks.  Patient denies injury or trauma, states the pain began gradually and has worsened since time of onset.  Patient describes the pain as a shooting burning pain that radiates from the right lower back to the right anterior thigh, constant and worsened with laying flat.  Patient has used prednisone Dosepak as well as a methylprednisolone injection without improvement of his pain.  Patient denies bowel/bladder incontinence, saddle area paresthesias, fever, injury/trauma, history of cancer, history of IV drug use or numbness/weakness or tingling of the extremities.  Patient states that aside from his right-sided low back pain and right anterior thigh pain he is without complaint today.  Patient was a scheduled for an outpatient MRI of the lumbar spine yesterday in  however he was unable to complete the study due to pain and inability to lie flat for extended period of time.  Patient states that he was sent here by the MRI technicians because they were unable to provide him with pain medication to tolerate the MRI.  HPI  Past Medical History:  Diagnosis Date  . Arthritis   . CAD (coronary artery disease)    a. s/p prior CABG x 3 9LIMA->LAD, VG->RPDA, VG->OM;  b. s/p prior stenting of OM1;  c. 03/2017 Cath/PCI: LAD 100, LCX 90p (3.5x20 Synergy DES), OM1 90 ISR (CBA), RCA 100p, LIMA->LAD ok, VG->OM 100, VG->RPDA 100.  Marland Kitchen GERD (gastroesophageal reflux disease)   . HLD (hyperlipidemia)   . HTN (hypertension)   . Moderate aortic stenosis    a. 03/2017 Echo: EF 60-65%, Gr2 DD, mod AS.  . Tobacco abuse     Patient Active Problem List   Diagnosis Date Noted    . Nonrheumatic aortic valve stenosis   . Status post coronary artery stent placement   . Malnutrition of moderate degree 03/24/2017  . Unstable angina (HCC) 03/23/2017  . Fall at home 12/24/2013  . Left hip pain 12/24/2013  . Back pain, lumbosacral 12/24/2013  . Loss of weight 12/24/2013  . Right ear pain 12/24/2013  . Lump in neck 12/24/2013  . Tobacco abuse 12/24/2013  . Hearing loss in right ear 12/10/2013  . Ear drainage right 12/10/2013  . Chronic left hip pain 12/10/2013  . Otitis externa of right ear 12/10/2013  . Submandibular sialoadenitis 10/09/2013  . Mass of right submandibular region 10/09/2013  . COPD bronchitis 10/09/2013  . Difficulty swallowing 10/09/2013  . Unintentional weight loss of more than 10 pounds in 90 days 10/09/2013  . 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 HTN (hypertension) 06/24/2013  . COPD (chronic obstructive pulmonary disease) (HCC) 06/24/2013  . CAD (coronary artery disease), bypass graft transplanted heart 06/24/2013  . HLD (hyperlipidemia) 06/24/2013  . Tobacco use disorder 06/24/2013    Past Surgical History:  Procedure Laterality Date  . BACK SURGERY    . CORONARY ARTERY BYPASS GRAFT  2008  . CORONARY BALLOON ANGIOPLASTY N/A 03/26/2017   Procedure: Coronary Balloon Angioplasty;  Surgeon: Tonny Bollman, MD;  Location: [facility] INVASIVE CV LAB;  Service: Cardiovascular;  Laterality: N/A;  . CORONARY STENT INTERVENTION N/A 03/26/2017   Procedure: Coronary Stent Intervention;  Surgeon: Tonny Bollman, MD;  Location: [facility] INVASIVE CV LAB;  Service: Cardiovascular;  Laterality: N/A;  . RIGHT/LEFT HEART CATH AND CORONARY/GRAFT ANGIOGRAPHY N/A 03/26/2017   Procedure: Right/Left Heart Cath and Coronary/Graft Angiography;  Surgeon: Tonny Bollman, MD;  Location: [facility] INVASIVE CV LAB;  Service: Cardiovascular;  Laterality: N/A;        Home Medications    Prior to Admission medications   Medication Sig Start Date End Date Taking? Authorizing  Provider  albuterol (PROVENTIL HFA;VENTOLIN HFA) 108 (90 BASE) MCG/ACT inhaler Inhale 2 puffs into the lungs every 6 (six) hours as needed for wheezing or shortness of breath. 10/09/13   Kela Millin, MD  aspirin EC 81 MG EC tablet Take 1 tablet (81 mg total) by mouth daily. 03/28/17   Creig Hines, NP  atorvastatin (LIPITOR) 80 MG tablet Take 1 tablet (80 mg total) by mouth daily. 11/14/17 05/15/18  Laqueta Linden, MD  Cholecalciferol (VITAMIN D3) 1000 units CAPS Take 1 capsule by mouth daily.     [provider]  clopidogrel (PLAVIX) 75 MG tablet TAKE 1 TABLET BY MOUTH EACH MORNING WITH BREAKFAST. 04/12/18   Laqueta Linden, MD  esomeprazole (NEXIUM) 20 MG capsule Take 20 mg by mouth daily at 12 noon.    [provider]  fluticasone (FLONASE) 50 MCG/ACT nasal spray Place 1 spray into both nostrils daily.    [provider]  HYDROcodone-acetaminophen (NORCO/VICODIN) 5-325 MG tablet Take 1 tablet by mouth every 6 (six) hours as needed for up to 5 days for moderate pain. 08/12/18 08/17/18  Vickki Hearing, MD  isosorbide mononitrate (IMDUR) 30 MG 24 hr tablet Take 1 tablet (30 mg total) by mouth daily. 05/15/18 08/13/18  Laqueta Linden, MD  lidocaine (LIDODERM) 5 % Place 1 patch onto the skin daily. Remove & Discard patch within 12 hours or as directed by MD 08/17/18   Harlene Salts A, PA-C  losartan (COZAAR) 25 MG tablet Take 1 tablet (25 mg total) by mouth daily. 11/14/17 02/12/18  Laqueta Linden, MD  methocarbamol (ROBAXIN) 500 MG tablet Take 1 tablet (500 mg total) by mouth 2 (two) times daily. 08/17/18   Harlene Salts A, PA-C  metoprolol succinate (TOPROL-XL) 50 MG 24 hr tablet TAKE ONE TABLET BY MOUTH ONCE DAILY. 04/12/18   Laqueta Linden, MD  nicotine (NICODERM CQ - DOSED IN MG/24 HOURS) 21 mg/24hr patch Place 1 patch (21 mg total) onto the skin daily. Patient not taking: Reported on 08/12/2018 03/28/17   Creig Hines, NP  nitroGLYCERIN (NITROSTAT) 0.4 MG SL tablet Place 1 tablet (0.4 mg total) under the tongue every 5 (five) minutes as needed for chest pain. 05/15/18 08/13/18  Laqueta Linden, MD  traMADol-acetaminophen (ULTRACET) 37.5-325 MG tablet Take 1 tablet by mouth every 4 (four) hours as needed. Patient not taking: Reported on 08/12/2018 11/27/17   Vickki Hearing, MD  umeclidinium bromide (INCRUSE ELLIPTA) 62.5 MCG/INH AEPB Inhale 1 puff into the lungs daily.  08/12/15   [provider]    Family History Family History  Problem Relation Age of Onset  . Heart attack Mother   . Alcohol abuse Father   . CAD Brother     Social History Social History   Tobacco Use  . Smoking status: Former Smoker    Packs/day: 2.00    Years: 45.00    Pack years: 90.00    Types: Cigarettes    Last attempt to quit: 08/11/2016    Years since quitting: 2.0  .  Smokeless tobacco: Never Used  Substance Use Topics  . Alcohol use: Yes    Comment: occasionally  . Drug use: No     Allergies   Patient has no known allergies.   Review of Systems Review of Systems  Constitutional: Negative.  Negative for chills and fever.  Respiratory: Negative.  Negative for shortness of breath.   Cardiovascular: Negative.  Negative for chest pain.  Gastrointestinal: Negative.  Negative for abdominal pain, nausea and vomiting.  Genitourinary: Negative.  Negative for dysuria and hematuria.  Musculoskeletal: Positive for back pain. Negative for neck pain.  Skin: Negative.  Negative for color change and wound.  Neurological: Negative.  Negative for syncope, weakness, numbness and headaches.       Denies bowel/bladder incontinence Denies saddle area paresthesias   Physical Exam Updated Vital Signs BP (!) 135/99   Pulse 62   Temp 97.8 F (36.6 C) (Oral)   Resp 16   SpO2 100%   Physical Exam Constitutional:      General: He is not in acute distress.    Appearance: He is well-developed. He is not  ill-appearing.  HENT:     Head: Normocephalic and atraumatic.     Right Ear: External ear normal.     Left Ear: External ear normal.     Nose: Nose normal.  Eyes:     Pupils: Pupils are equal, round, and reactive to light.  Neck:     Musculoskeletal: Full passive range of motion without pain, normal range of motion and neck supple. No spinous process tenderness or muscular tenderness.     Trachea: Trachea and phonation normal. No tracheal deviation.  Cardiovascular:     Rate and Rhythm: Normal rate and regular rhythm.     Pulses: Normal pulses.          Dorsalis pedis pulses are 2+ on the right side and 2+ on the left side.       Posterior tibial pulses are 2+ on the right side and 2+ on the left side.     Heart sounds: Normal heart sounds.  Pulmonary:     Effort: Pulmonary effort is normal. No respiratory distress.     Breath sounds: Normal breath sounds and air entry.  Chest:     Chest wall: No deformity, tenderness or crepitus.  Abdominal:     Palpations: Abdomen is soft.     Tenderness: There is no abdominal tenderness. There is no guarding or rebound.  Musculoskeletal: Normal range of motion.     Right knee: Normal.     Left knee: Normal.       Back:     Right lower leg: Normal.     Left lower leg: Normal.       Legs:     Comments: Patient with right-sided lumbar muscular skeletal tenderness to palpation, right-sided gluteal muscular tenderness to palpation and right quadricep muscular tenderness to palpation.  No midline C/T/L spinal tenderness to palpation, no paraspinal muscle tenderness, no deformity, crepitus, or step-off noted. No sign of injury to the neck or back.  Patient is unable to lay flat on his back without increased pain.  Hips stable to compression bilaterally.  Neurovascularly intact to bilateral lower extremities.  Feet:     Right foot:     Protective Sensation: 3 sites tested. 3 sites sensed.     Left foot:     Protective Sensation: 3 sites  tested. 3 sites sensed.  Skin:    General: Skin is  warm and dry.     Capillary Refill: Capillary refill takes less than 2 seconds.  Neurological:     General: No focal deficit present.     Mental Status: He is alert.     GCS: GCS eye subscore is 4. GCS verbal subscore is 5. GCS motor subscore is 6.     Cranial Nerves: No cranial nerve deficit.     Sensory: Sensation is intact. No sensory deficit.     Motor: Motor function is intact.     Coordination: Coordination is intact.     Comments: Speech is clear and goal oriented, follows commands Major Cranial nerves without deficit, no facial droop Normal strength in upper and lower extremities bilaterally including dorsiflexion and plantar flexion, strong and equal grip strength Sensation normal to light touch Moves extremities without ataxia, coordination intact Slow gait due to pain  Psychiatric:        Mood and Affect: Mood normal.        Behavior: Behavior normal.    ED Treatments / Results  Labs (all labs ordered are listed, but only abnormal results are displayed) Labs Reviewed - No data to display  EKG None  Radiology No results found.  Procedures Procedures (including critical care time)  Medications Ordered in ED Medications  lidocaine (LIDODERM) 5 % 1 patch (1 patch Transdermal Patch Applied 08/17/18 1156)  oxyCODONE-acetaminophen (PERCOCET/ROXICET) 5-325 MG per tablet 1 tablet (1 tablet Oral Given 08/17/18 0626)  oxyCODONE-acetaminophen (PERCOCET/ROXICET) 5-325 MG per tablet 1 tablet (1 tablet Oral Given 08/17/18 0911)  LORazepam (ATIVAN) tablet 1 mg (1 mg Oral Given 08/17/18 0954)     Initial Impression / Assessment and Plan / ED Course  I have reviewed the triage vital signs and the nursing notes.  Pertinent labs & imaging results that were available during my care of the patient were reviewed by me and considered in my medical decision making (see chart for details).    19:84 AM: 67 year old male with 2-week  history of right-sided lower back pain with sciatic-like symptoms and multiple ER visits not improved with outpatient therapy.  Unable to tolerate MRI yesterday.  No infectious-like symptoms reported.  Case discussed with Dr. Blinda Leatherwood who agrees that non-contrast MRI of the lumbar spine at this time can be performed.  Pain medication ordered to help patient tolerate MRI. --------------------------------------------------------------------------------- 8:15 AM: Patient reassessed, resting comfortably, no acute distress.  Patient endorses some improvement of pain following single Percocet today, still endorses moderate amount of pain at this time.  Second Percocet ordered. ---------------------------------------------------------------------------------- Multiple attempts have been made to obtain MRI of patient's lumbar spine previously ordered by primary care provider.  Patient has been given multiple doses of Percocet as well as 1 mg of Ativan.  Patient states that his pain is greatly improved however he does not wish to lay flat on his back to obtain MRI because this increases his pain.  Patient also requesting to be sedated for MRI due to claustrophobia.  Do not feel that sedation for MRI at this time is warranted.   Patient is lying comfortably on his side in no acute distress while in emergency department, it appears that this changes once he arrives to the MRI suite.  Patient is able to lie on his side, sit up and look around department and ambulate without difficulty or assistance.  He states that he is unwilling to lay completely flat on his back for MRI today.  Lengthy discussion held with patient and his wife that  if patient is unwilling to undergo MRI at this time that conservative treatment may be effective.  Shared decision making was made with patient and his wife and they have elected to forego MRI at this time and pursue conservative treatment.  Discussed Robaxin with patient and his wife,  informed not to drive or operate heavy machinery while taking this medication.  Lidoderm patch is also been prescribed for comfort.  Patient also elects to use over-the-counter anti-inflammatories to help with his symptoms. RICE protocol and pain medicine indicated and discussed with patient.   No neurological deficits and normal neuro exam.  Patient can walk but states is painful.  Patient denies loss of bowel/bladder control or saddle area paresthesias.  No concern for cauda equina.  No fever, night sweats, weight loss, h/o cancer, or IVDU.  No abdominal pain, chest pain or shortness of breath, pedal pulses equal bilaterally, pain is consistently reproducible with light palpation, do not suspect AAA/dissection at this time.  Patient with 2 recent courses of steroid medications given, will not give additional steroids at this time.  Patient is afebrile, not tachycardic, not hypotensive, well-appearing and in no acute distress.  Patient is lying comfortably on his side and states substantial improvement of his pain today.  At this time there does not appear to be any evidence of an acute emergency medical condition and the patient appears stable for discharge with appropriate outpatient follow up. Diagnosis was discussed with patient who verbalizes understanding of care plan and is agreeable to discharge. I have discussed return precautions with patient and wife who verbalize understanding of return precautions. Patient strongly encouraged to follow-up with their PCP on Monday. All questions answered.  Patient's case has been discussed with Dr. Rodena Medin who agrees that MRI of the patient's lumbar spine is not necessary at this time and that he may be discharged with conservative treatment and outpatient PCP follow-up.  Note: Portions of this report may have been transcribed using voice recognition software. Every effort was made to ensure accuracy; however, inadvertent computerized transcription errors  may still be present. Final Clinical Impressions(s) / ED Diagnoses   Final diagnoses:  Acute right-sided low back pain with right-sided sciatica    ED Discharge Orders         Ordered    methocarbamol (ROBAXIN) 500 MG tablet  2 times daily     08/17/18 1230    lidocaine (LIDODERM) 5 %  Every 24 hours     08/17/18 1230           Elizabeth Palau 08/17/18 1422    Wynetta Fines, MD 08/18/18 [phone]

## 2018-08-17 NOTE — Progress Notes (Signed)
Patient was asleep when I went to see if the medication had started working and he said he could do scan. Patient was not in [pain or yelling or moaning from pain at this time. Took patient over to mri bed and patient moved over without any problems. After patient got onto table he stated immediately that he could not do the test that it simply wasn't going to work and wanted back on the stretcher. Patient moved back to stretcher and taken back to emergency room to see what other options the doctor could do for him.

## 2018-08-17 NOTE — Progress Notes (Signed)
Patient came to mri and placed on table to which he said I am  in a lot of pain and unable to lay flat nurse brought over medication (patient given meds prior to mri also in ed) for patient after 20 mins pt stated he felt he could do study. Patient moving over on his own and not yelling due to pain etc while on stretcher or moving. Once patient was put into mri scanner he started yelling etc and said he was in a lot of pain and could not do test. As soon as I removed patient from scanner bore he was ok and not yelling from pain. Patient was taken back to ed and nurse informed. dr called back and said he was giving patient more medication and sending him back to us to attempt to scan again. I asked patient if it was due to claustrophobia or pain as he is not yelling etc when he is not in the scanner to which he said I am not claustrophobic I can do mri's  all day I am in pain. We will attempt again when patient gets back.

## 2018-08-17 NOTE — ED Notes (Signed)
Patient verbalizes understanding of discharge instructions. Opportunity for questioning and answers were provided. Armband removed by staff, pt discharged from ED.  

## 2018-08-20 DIAGNOSIS — M79604 Pain in right leg: Secondary | ICD-10-CM | POA: Diagnosis not present

## 2018-08-20 DIAGNOSIS — M5441 Lumbago with sciatica, right side: Secondary | ICD-10-CM | POA: Diagnosis not present

## 2018-09-02 DIAGNOSIS — M25561 Pain in right knee: Secondary | ICD-10-CM | POA: Diagnosis not present

## 2018-09-02 DIAGNOSIS — M1711 Unilateral primary osteoarthritis, right knee: Secondary | ICD-10-CM | POA: Diagnosis not present

## 2018-09-06 ENCOUNTER — Ambulatory Visit (HOSPITAL_COMMUNITY): Payer: Medicare Other

## 2018-09-06 ENCOUNTER — Ambulatory Visit (HOSPITAL_COMMUNITY)
Admission: RE | Admit: 2018-09-06 | Discharge: 2018-09-06 | Disposition: A | Discharge status: Home / Self Care | Payer: Medicare Other | Source: Ambulatory Visit | Attending: Adult Health | Admitting: Adult Health

## 2018-09-06 DIAGNOSIS — M48061 Spinal stenosis, lumbar region without neurogenic claudication: Secondary | ICD-10-CM | POA: Diagnosis not present

## 2018-09-06 DIAGNOSIS — M5441 Lumbago with sciatica, right side: Secondary | ICD-10-CM | POA: Diagnosis not present

## 2018-09-10 DIAGNOSIS — M1711 Unilateral primary osteoarthritis, right knee: Secondary | ICD-10-CM | POA: Diagnosis not present

## 2018-09-10 DIAGNOSIS — M25561 Pain in right knee: Secondary | ICD-10-CM | POA: Diagnosis not present

## 2018-09-11 DIAGNOSIS — M48061 Spinal stenosis, lumbar region without neurogenic claudication: Secondary | ICD-10-CM | POA: Diagnosis not present

## 2018-09-11 DIAGNOSIS — M5134 Other intervertebral disc degeneration, thoracic region: Secondary | ICD-10-CM | POA: Diagnosis not present

## 2018-09-19 DIAGNOSIS — R262 Difficulty in walking, not elsewhere classified: Secondary | ICD-10-CM | POA: Diagnosis not present

## 2018-09-19 DIAGNOSIS — M25561 Pain in right knee: Secondary | ICD-10-CM | POA: Diagnosis not present

## 2018-09-19 DIAGNOSIS — M1711 Unilateral primary osteoarthritis, right knee: Secondary | ICD-10-CM | POA: Diagnosis not present

## 2018-09-23 DIAGNOSIS — M25561 Pain in right knee: Secondary | ICD-10-CM | POA: Diagnosis not present

## 2018-09-23 DIAGNOSIS — M1711 Unilateral primary osteoarthritis, right knee: Secondary | ICD-10-CM | POA: Diagnosis not present

## 2018-09-27 DIAGNOSIS — M47816 Spondylosis without myelopathy or radiculopathy, lumbar region: Secondary | ICD-10-CM | POA: Diagnosis not present

## 2018-09-27 DIAGNOSIS — M5416 Radiculopathy, lumbar region: Secondary | ICD-10-CM | POA: Diagnosis not present

## 2018-09-27 DIAGNOSIS — M48062 Spinal stenosis, lumbar region with neurogenic claudication: Secondary | ICD-10-CM | POA: Diagnosis not present

## 2018-09-27 DIAGNOSIS — M5136 Other intervertebral disc degeneration, lumbar region: Secondary | ICD-10-CM | POA: Diagnosis not present

## 2018-11-04 DIAGNOSIS — I10 Essential (primary) hypertension: Secondary | ICD-10-CM | POA: Diagnosis not present

## 2018-11-04 DIAGNOSIS — E785 Hyperlipidemia, unspecified: Secondary | ICD-10-CM | POA: Diagnosis not present

## 2018-11-05 ENCOUNTER — Encounter: Payer: Self-pay | Admitting: Cardiovascular Disease

## 2018-11-05 ENCOUNTER — Ambulatory Visit (INDEPENDENT_AMBULATORY_CARE_PROVIDER_SITE_OTHER): Payer: Medicare Other | Admitting: Cardiovascular Disease

## 2018-11-05 VITALS — BP 124/76 | HR 86 | Ht 68.0 in | Wt 188.0 lb

## 2018-11-05 DIAGNOSIS — I350 Nonrheumatic aortic (valve) stenosis: Secondary | ICD-10-CM | POA: Diagnosis not present

## 2018-11-05 DIAGNOSIS — I10 Essential (primary) hypertension: Secondary | ICD-10-CM | POA: Diagnosis not present

## 2018-11-05 DIAGNOSIS — Z720 Tobacco use: Secondary | ICD-10-CM | POA: Diagnosis not present

## 2018-11-05 DIAGNOSIS — I25708 Atherosclerosis of coronary artery bypass graft(s), unspecified, with other forms of angina pectoris: Secondary | ICD-10-CM | POA: Diagnosis not present

## 2018-11-05 DIAGNOSIS — E785 Hyperlipidemia, unspecified: Secondary | ICD-10-CM | POA: Diagnosis not present

## 2018-11-05 NOTE — Progress Notes (Signed)
SUBJECTIVE: The patient presents for routine follow-up for coronary artery disease and aortic stenosis.  ABIs were unremarkable on 11/19/2017.  In summary, he was hospitalized for unstable angina in July 2018 and underwent drug-eluting stent placement of the proximal left circumflex and balloon angioplasty of obtuse marginal branches.  He has a history of CABG and coronary angiogram demonstrated patency of the LIMA to LAD and total occlusion of the saphenous vein graft to the distal RCA and OM branches. The right PDA branch collateralized from the LAD visualized on the LIMA injections.  He was also found to have moderate aortic stenosis with a mean gradient of 24 mmHg and a peak to peak gradient of 34 mill meters mercury, with a calculated aortic valve area of 0.96 cm.  Echocardiogram on 05/23/2018 demonstrated normal left ventricular systolic function, LVEF 55 to 40%, with moderate aortic stenosis and severe left atrial dilatation.  Diastolic dysfunction was present.  ECG performed today which I ordered and personally interpreted demonstrates sinus rhythm with PVC.  He seldom has chest pains.  When they occur they are located in the retrosternal region and radiate to his neck.  Symptoms improved with rest.  He has not had to use nitroglycerin.  He feels Imdur has improved his symptoms overall.  He denies palpitations.  He denies leg swelling.  He told me about how he injured his right knee and had been crawling around in December and January in order to get anywhere.  He eventually saw a specialist to gave 3 injections to his right knee and now he is able to walk.  He smokes about 1/2 pack of cigarettes daily.  He had been smoking 2 to 3 packs daily.    Review of Systems: As per "subjective", otherwise negative.  No Known Allergies  Current Outpatient Medications  Medication Sig Dispense Refill  . albuterol (PROVENTIL HFA;VENTOLIN HFA) 108 (90 BASE) MCG/ACT inhaler Inhale 2  puffs into the lungs every 6 (six) hours as needed for wheezing or shortness of breath. 1 Inhaler 5  . aspirin EC 81 MG EC tablet Take 1 tablet (81 mg total) by mouth daily. 30 tablet 6  . atorvastatin (LIPITOR) 80 MG tablet Take 1 tablet (80 mg total) by mouth daily. 90 tablet 3  . Cholecalciferol (VITAMIN D3) 1000 units CAPS Take 1 capsule by mouth daily.     . clopidogrel (PLAVIX) 75 MG tablet TAKE 1 TABLET BY MOUTH EACH MORNING WITH BREAKFAST. 30 tablet 6  . esomeprazole (NEXIUM) 20 MG capsule Take 20 mg by mouth daily at 12 noon.    . fluticasone (FLONASE) 50 MCG/ACT nasal spray Place 1 spray into both nostrils daily.    Marland Kitchen lidocaine (LIDODERM) 5 % Place 1 patch onto the skin daily. Remove & Discard patch within 12 hours or as directed by MD 30 patch 0  . methocarbamol (ROBAXIN) 500 MG tablet Take 1 tablet (500 mg total) by mouth 2 (two) times daily. 14 tablet 0  . metoprolol succinate (TOPROL-XL) 50 MG 24 hr tablet TAKE ONE TABLET BY MOUTH ONCE DAILY. 30 tablet 6  . nicotine (NICODERM CQ - DOSED IN MG/24 HOURS) 21 mg/24hr patch Place 1 patch (21 mg total) onto the skin daily. 30 patch 1  . traMADol-acetaminophen (ULTRACET) 37.5-325 MG tablet Take 1 tablet by mouth every 4 (four) hours as needed. 30 tablet 0  . umeclidinium bromide (INCRUSE ELLIPTA) 62.5 MCG/INH AEPB Inhale 1 puff into the lungs daily.     Marland Kitchen  isosorbide mononitrate (IMDUR) 30 MG 24 hr tablet Take 1 tablet (30 mg total) by mouth daily. 90 tablet 3  . losartan (COZAAR) 25 MG tablet Take 1 tablet (25 mg total) by mouth daily. 90 tablet 3  . nitroGLYCERIN (NITROSTAT) 0.4 MG SL tablet Place 1 tablet (0.4 mg total) under the tongue every 5 (five) minutes as needed for chest pain. 90 tablet 3   No current facility-administered medications for this visit.     Past Medical History:  Diagnosis Date  . Arthritis   . CAD (coronary artery disease)    a. s/p prior CABG x 3 9LIMA->LAD, VG->RPDA, VG->OM;  b. s/p prior stenting of OM1;  c.  03/2017 Cath/PCI: LAD 100, LCX 90p (3.5x20 Synergy DES), OM1 90 ISR (CBA), RCA 100p, LIMA->LAD ok, VG->OM 100, VG->RPDA 100.  Marland Kitchen GERD (gastroesophageal reflux disease)   . HLD (hyperlipidemia)   . HTN (hypertension)   . Moderate aortic stenosis    a. 03/2017 Echo: EF 60-65%, Gr2 DD, mod AS.  . Tobacco abuse     Past Surgical History:  Procedure Laterality Date  . BACK SURGERY    . CORONARY ARTERY BYPASS GRAFT  2008  . CORONARY BALLOON ANGIOPLASTY N/A 03/26/2017   Procedure: Coronary Balloon Angioplasty;  Surgeon: Tonny Bollman, MD;  Location: [facility] INVASIVE CV LAB;  Service: Cardiovascular;  Laterality: N/A;  . CORONARY STENT INTERVENTION N/A 03/26/2017   Procedure: Coronary Stent Intervention;  Surgeon: Tonny Bollman, MD;  Location: [facility] INVASIVE CV LAB;  Service: Cardiovascular;  Laterality: N/A;  . RIGHT/LEFT HEART CATH AND CORONARY/GRAFT ANGIOGRAPHY N/A 03/26/2017   Procedure: Right/Left Heart Cath and Coronary/Graft Angiography;  Surgeon: Tonny Bollman, MD;  Location: [facility] INVASIVE CV LAB;  Service: Cardiovascular;  Laterality: N/A;    Social History   Socioeconomic History  . Marital status: Divorced    Spouse name: Not on file  . Number of children: Not on file  . Years of education: Not on file  . Highest education level: Not on file  Occupational History  . Not on file  Social Needs  . Financial resource strain: Not on file  . Food insecurity:    Worry: Not on file    Inability: Not on file  . Transportation needs:    Medical: Not on file    Non-medical: Not on file  Tobacco Use  . Smoking status: Former Smoker    Packs/day: 2.00    Years: 45.00    Pack years: 90.00    Types: Cigarettes    Last attempt to quit: 08/11/2016    Years since quitting: 2.2  . Smokeless tobacco: Never Used  Substance and Sexual Activity  . Alcohol use: Yes    Comment: occasionally  . Drug use: No  . Sexual activity: Never  Lifestyle  . Physical activity:    Days per week: Not on file      Minutes per session: Not on file  . Stress: Not on file  Relationships  . Social connections:    Talks on phone: Not on file    Gets together: Not on file    Attends religious service: Not on file    Active member of club or organization: Not on file    Attends meetings of clubs or organizations: Not on file    Relationship status: Not on file  . Intimate partner violence:    Fear of current or ex partner: Not on file    Emotionally abused: Not on file  Physically abused: Not on file    Forced sexual activity: Not on file  Other Topics Concern  . Not on file  Social History Narrative  . Not on file     Vitals:   11/05/18 1032  BP: 124/76  Pulse: 86  SpO2: 97%  Weight: 188 lb (85.3 kg)  Height: 5\' 8"  (1.727 m)    Wt Readings from Last 3 Encounters:  11/05/18 188 lb (85.3 kg)  08/12/18 198 lb (89.8 kg)  05/15/18 197 lb (89.4 kg)     PHYSICAL EXAM General: NAD HEENT: Normal. Neck: No JVD, no thyromegaly. Lungs: Clear to auscultation bilaterally with normal respiratory effort. CV: Regular rate and rhythm, normal S1/S2, no S3/S4, 3/6 ejection systolicmurmurheard throughout precordium but loudest over right upper sternal border. No pretibial or periankle edema.  No carotid bruit.   Abdomen: Soft, nontender, no distention.  Neurologic: Alert and oriented.  Psych: Normal affect. Skin: Normal. Musculoskeletal: No gross deformities.    ECG: Reviewed above under Subjective   Labs: Lab Results  Component Value Date/Time   K 4.1 07/22/2017 01:17 PM   BUN 22 (H) 07/22/2017 01:17 PM   CREATININE 1.00 07/22/2017 01:17 PM   CREATININE 0.68 12/24/2013 09:59 AM   ALT 9 06/24/2013 11:19 AM   TSH 0.758 12/24/2013 09:59 AM   HGB 13.3 07/22/2017 01:17 PM     Lipids: Lab Results  Component Value Date/Time   LDLCALC 90 03/25/2017 05:19 AM   CHOL 168 03/25/2017 05:19 AM   TRIG 62 03/25/2017 05:19 AM   HDL 66 03/25/2017 05:19 AM       ASSESSMENT AND PLAN: 1.  Coronary artery disease with PCI in July 2018:  Symptomatically stable. Coronary angiogram details noted above. Continue aspirin, Lipitor, Imdur, and metoprolol succinate.   2. Aortic stenosis: Moderate in severity by echocardiogram in September 2019.  I will continue to monitor.  I will obtain a follow-up echocardiogram in September 2020.  3. Hyperlipidemia: Continue Lipitor 80 mg daily.  I will obtain a copy of lipids from PCP.  4. Hypertension: Blood pressure is normal.  5.  Tobacco use: He smokes 1/2 pack of cigarettes daily.  He used to smoke 2 to 3 packs/day.    Disposition: Follow up 6 months   Prentice Docker, M.D., F.A.C.C.

## 2018-11-05 NOTE — Patient Instructions (Addendum)
Medication Instructions: Your physician recommends that you continue on your current medications as directed. Please refer to the Current Medication list given to you today.   Labwork: None today  Procedures/Testing: None today  Follow-Up: 6 months with Dr.Koneswaran  Any Additional Special Instructions Will Be Listed Below (If Applicable).     If you need a refill on your cardiac medications before your next appointment, please call your pharmacy.   

## 2018-11-05 NOTE — Progress Notes (Signed)
ekg 

## 2018-11-07 DIAGNOSIS — M25561 Pain in right knee: Secondary | ICD-10-CM | POA: Diagnosis not present

## 2018-11-07 DIAGNOSIS — I2510 Atherosclerotic heart disease of native coronary artery without angina pectoris: Secondary | ICD-10-CM | POA: Diagnosis not present

## 2018-11-07 DIAGNOSIS — E785 Hyperlipidemia, unspecified: Secondary | ICD-10-CM | POA: Diagnosis not present

## 2018-11-07 DIAGNOSIS — M545 Low back pain: Secondary | ICD-10-CM | POA: Diagnosis not present

## 2018-11-07 DIAGNOSIS — I10 Essential (primary) hypertension: Secondary | ICD-10-CM | POA: Diagnosis not present

## 2019-01-17 ENCOUNTER — Other Ambulatory Visit: Payer: Self-pay | Admitting: Cardiovascular Disease

## 2019-02-17 ENCOUNTER — Other Ambulatory Visit: Payer: Self-pay

## 2019-02-17 ENCOUNTER — Encounter (HOSPITAL_COMMUNITY): Payer: Self-pay | Admitting: Emergency Medicine

## 2019-02-17 ENCOUNTER — Emergency Department (HOSPITAL_COMMUNITY)
Admission: EM | Admit: 2019-02-17 | Discharge: 2019-02-17 | Disposition: A | Discharge status: Home / Self Care | Payer: Medicare Other | Attending: Emergency Medicine | Admitting: Emergency Medicine

## 2019-02-17 DIAGNOSIS — Z951 Presence of aortocoronary bypass graft: Secondary | ICD-10-CM | POA: Diagnosis not present

## 2019-02-17 DIAGNOSIS — I10 Essential (primary) hypertension: Secondary | ICD-10-CM | POA: Diagnosis not present

## 2019-02-17 DIAGNOSIS — J449 Chronic obstructive pulmonary disease, unspecified: Secondary | ICD-10-CM | POA: Diagnosis not present

## 2019-02-17 DIAGNOSIS — Y999 Unspecified external cause status: Secondary | ICD-10-CM | POA: Diagnosis not present

## 2019-02-17 DIAGNOSIS — W57XXXA Bitten or stung by nonvenomous insect and other nonvenomous arthropods, initial encounter: Secondary | ICD-10-CM | POA: Insufficient documentation

## 2019-02-17 DIAGNOSIS — Z7982 Long term (current) use of aspirin: Secondary | ICD-10-CM | POA: Insufficient documentation

## 2019-02-17 DIAGNOSIS — Z87891 Personal history of nicotine dependence: Secondary | ICD-10-CM | POA: Diagnosis not present

## 2019-02-17 DIAGNOSIS — Z79899 Other long term (current) drug therapy: Secondary | ICD-10-CM | POA: Diagnosis not present

## 2019-02-17 DIAGNOSIS — I2510 Atherosclerotic heart disease of native coronary artery without angina pectoris: Secondary | ICD-10-CM | POA: Insufficient documentation

## 2019-02-17 DIAGNOSIS — Y939 Activity, unspecified: Secondary | ICD-10-CM | POA: Insufficient documentation

## 2019-02-17 DIAGNOSIS — Y929 Unspecified place or not applicable: Secondary | ICD-10-CM | POA: Diagnosis not present

## 2019-02-17 DIAGNOSIS — L03312 Cellulitis of back [any part except buttock]: Secondary | ICD-10-CM | POA: Insufficient documentation

## 2019-02-17 DIAGNOSIS — S40262A Insect bite (nonvenomous) of left shoulder, initial encounter: Secondary | ICD-10-CM | POA: Diagnosis not present

## 2019-02-17 MED ORDER — DOXYCYCLINE HYCLATE 100 MG PO TABS
100.0000 mg | ORAL_TABLET | Freq: Once | ORAL | Status: AC
  Administered 2019-02-17: 100 mg via ORAL
  Filled 2019-02-17: qty 1

## 2019-02-17 MED ORDER — DOXYCYCLINE HYCLATE 100 MG PO CAPS: 100.0000 mg | ORAL_CAPSULE | Freq: Two times a day (BID) | ORAL | 0 refills | Status: DC

## 2019-02-17 NOTE — ED Triage Notes (Signed)
Pt reports he was bit by a tick x 3 days ago on left shoulder blade

## 2019-02-17 NOTE — ED Provider Notes (Signed)
[facility] EMERGENCY DEPARTMENT Provider Note   CSN: [id number] Arrival date & time: 02/17/19  0251     History   Chief Complaint Chief Complaint  Patient presents with  . Insect Bite    tick    HPI Craig Harmon is a 68 y.o. male.     Patient presents to the emergency department for evaluation of tick bite.  Patient reports that he found a tick on his left shoulder blade area 3 days ago.  He was unable to see it so he kept scratching at it trying to remove it.  Since then he has developed increasing pain in the area.  He has not had any fever.  Denies any other rash.     Past Medical History:  Diagnosis Date  . Arthritis   . CAD (coronary artery disease)    a. s/p prior CABG x 3 9LIMA->LAD, VG->RPDA, VG->OM;  b. s/p prior stenting of OM1;  c. 03/2017 Cath/PCI: LAD 100, LCX 90p (3.5x20 Synergy DES), OM1 90 ISR (CBA), RCA 100p, LIMA->LAD ok, VG->OM 100, VG->RPDA 100.  Marland Kitchen GERD (gastroesophageal reflux disease)   . HLD (hyperlipidemia)   . HTN (hypertension)   . Moderate aortic stenosis    a. 03/2017 Echo: EF 60-65%, Gr2 DD, mod AS.  . Tobacco abuse     Patient Active Problem List   Diagnosis Date Noted  . Nonrheumatic aortic valve stenosis   . Status post coronary artery stent placement   . Malnutrition of moderate degree 03/24/2017  . Unstable angina ([place]) 03/23/2017  . Fall at home 12/24/2013  . Left hip pain 12/24/2013  . Back pain, lumbosacral 12/24/2013  . Loss of weight 12/24/2013  . Right ear pain 12/24/2013  . Lump in neck 12/24/2013  . Tobacco abuse 12/24/2013  . Hearing loss in right ear 12/10/2013  . Ear drainage right 12/10/2013  . Chronic left hip pain 12/10/2013  . Otitis externa of right ear 12/10/2013  . Submandibular sialoadenitis 10/09/2013  . Mass of right submandibular region 10/09/2013  . COPD bronchitis 10/09/2013  . Difficulty swallowing 10/09/2013  . Unintentional weight loss of more than 10 pounds in 90 days 10/09/2013  .  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 HTN (hypertension) 06/24/2013  . COPD (chronic obstructive pulmonary disease) ([place]) 06/24/2013  . CAD (coronary artery disease), bypass graft transplanted heart 06/24/2013  . HLD (hyperlipidemia) 06/24/2013  . Tobacco use disorder 06/24/2013    Past Surgical History:  Procedure Laterality Date  . BACK SURGERY    . CORONARY ARTERY BYPASS GRAFT  2008  . CORONARY BALLOON ANGIOPLASTY N/A 03/26/2017   Procedure: Coronary Balloon Angioplasty;  Surgeon: Sherren Mocha, MD;  Location: [place] CV LAB;  Service: Cardiovascular;  Laterality: N/A;  . CORONARY STENT INTERVENTION N/A 03/26/2017   Procedure: Coronary Stent Intervention;  Surgeon: Sherren Mocha, MD;  Location: [place] CV LAB;  Service: Cardiovascular;  Laterality: N/A;  . RIGHT/LEFT HEART CATH AND CORONARY/GRAFT ANGIOGRAPHY N/A 03/26/2017   Procedure: Right/Left Heart Cath and Coronary/Graft Angiography;  Surgeon: Sherren Mocha, MD;  Location: [place] CV LAB;  Service: Cardiovascular;  Laterality: N/A;        Home Medications    Prior to Admission medications   Medication Sig Start Date End Date Taking? Authorizing Provider  albuterol (PROVENTIL HFA;VENTOLIN HFA) 108 (90 BASE) MCG/ACT inhaler Inhale 2 puffs into the lungs every 6 (six) hours as needed for wheezing or shortness of breath. 10/09/13   Sandi Mealy, MD  aspirin  EC 81 MG EC tablet Take 1 tablet (81 mg total) by mouth daily. 03/28/17   Creig HinesBerge,  Ronald, NP  atorvastatin (LIPITOR) 80 MG tablet Take 1 tablet (80 mg total) by mouth daily. 11/14/17 11/05/18  Laqueta LindenKoneswaran, Suresh A, MD  Cholecalciferol (VITAMIN D3) 1000 units CAPS Take 1 capsule by mouth daily.     [provider]  doxycycline (VIBRAMYCIN) 100 MG capsule Take 1 capsule (100 mg total) by mouth 2 (two) times daily. 02/17/19   Gilda CreasePollina,  J, MD  esomeprazole (NEXIUM) 20 MG capsule Take 20 mg by mouth daily at 12 noon.    [provider]  fluticasone (FLONASE) 50 MCG/ACT nasal spray Place 1 spray into both nostrils daily.    [provider]  isosorbide mononitrate (IMDUR) 30 MG 24 hr tablet Take 1 tablet (30 mg total) by mouth daily. 05/15/18 08/13/18  Laqueta LindenKoneswaran, Suresh A, MD  lidocaine (LIDODERM) 5 % Place 1 patch onto the skin daily. Remove & Discard patch within 12 hours or as directed by MD 08/17/18   Harlene SaltsMorelli, Brandon A, PA-C  losartan (COZAAR) 25 MG tablet TAKE ONE TABLET BY MOUTH ONCE DAILY. 01/17/19   Laqueta LindenKoneswaran, Suresh A, MD  methocarbamol (ROBAXIN) 500 MG tablet Take 1 tablet (500 mg total) by mouth 2 (two) times daily. 08/17/18   Harlene SaltsMorelli, Brandon A, PA-C  metoprolol succinate (TOPROL-XL) 50 MG 24 hr tablet TAKE ONE TABLET BY MOUTH ONCE DAILY. 04/12/18   Laqueta LindenKoneswaran, Suresh A, MD  nicotine (NICODERM CQ - DOSED IN MG/24 HOURS) 21 mg/24hr patch Place 1 patch (21 mg total) onto the skin daily. 03/28/17   Creig HinesBerge,  Ronald, NP  nitroGLYCERIN (NITROSTAT) 0.4 MG SL tablet Place 1 tablet (0.4 mg total) under the tongue every 5 (five) minutes as needed for chest pain. 05/15/18 08/13/18  Laqueta LindenKoneswaran, Suresh A, MD  traMADol-acetaminophen (ULTRACET) 37.5-325 MG tablet Take 1 tablet by mouth every 4 (four) hours as needed. 11/27/17   Vickki HearingHarrison, Stanley E, MD  umeclidinium bromide (INCRUSE ELLIPTA) 62.5 MCG/INH AEPB Inhale 1 puff into the lungs daily.  08/12/15   [provider]    Family History Family History  Problem Relation Age of Onset  . Heart attack Mother   . Alcohol abuse Father   . CAD Brother     Social History Social History   Tobacco Use  . Smoking status: Former Smoker    Packs/day: 2.00    Years: 45.00    Pack years: 90.00    Types: Cigarettes    Quit date: 08/11/2016    Years since quitting: 2.5  . Smokeless tobacco: Never Used  Substance Use Topics  . Alcohol use: Yes    Comment: occasionally  . Drug use: No     Allergies   Patient has no known allergies.    Review of Systems Review of Systems  Constitutional: Negative for fever.  Skin: Positive for wound.  All other systems reviewed and are negative.    Physical Exam Updated Vital Signs BP (!) 152/93 (BP Location: Right Arm)   Pulse 88   Temp 97.9 F (36.6 C) (Oral)   Resp 16   Ht 5\' 8"  (1.727 m)   Wt 81.6 kg   SpO2 100%   BMI 27.37 kg/m   Physical Exam Vitals signs and nursing note reviewed.  Constitutional:      General: He is not in acute distress.    Appearance: Normal appearance. He is well-developed.  HENT:     Head: Normocephalic and  atraumatic.     Right Ear: Hearing normal.     Left Ear: Hearing normal.     Nose: Nose normal.  Eyes:     Conjunctiva/sclera: Conjunctivae normal.     Pupils: Pupils are equal, round, and reactive to light.  Neck:     Musculoskeletal: Normal range of motion and neck supple.  Cardiovascular:     Rate and Rhythm: Regular rhythm.     Heart sounds: S1 normal and S2 normal. No murmur. No friction rub. No gallop.   Pulmonary:     Effort: Pulmonary effort is normal. No respiratory distress.     Breath sounds: Normal breath sounds.  Chest:     Chest wall: No tenderness.  Abdominal:     General: Bowel sounds are normal.     Palpations: Abdomen is soft.     Tenderness: There is no abdominal tenderness. There is no guarding or rebound. Negative signs include Murphy's sign and McBurney's sign.     Hernia: No hernia is present.  Musculoskeletal: Normal range of motion.  Skin:    General: Skin is warm and dry.     Findings: No rash.     Comments: 1.25 cm x 0.4 cm deep linear excoriation with erythema at the wound margins over left shoulder blade  Neurological:     Mental Status: He is alert and oriented to person, place, and time.     GCS: GCS eye subscore is 4. GCS verbal subscore is 5. GCS motor subscore is 6.     Cranial Nerves: No cranial nerve deficit.     Sensory: No sensory deficit.     Coordination: Coordination normal.   Psychiatric:        Speech: Speech normal.        Behavior: Behavior normal.        Thought Content: Thought content normal.      ED Treatments / Results  Labs (all labs ordered are listed, but only abnormal results are displayed) Labs Reviewed - No data to display  EKG    Radiology No results found.  Procedures Procedures (including critical care time)  Medications Ordered in ED Medications  doxycycline (VIBRA-TABS) tablet 100 mg (has no administration in time range)     Initial Impression / Assessment and Plan / ED Course  I have reviewed the triage vital signs and the nursing notes.  Pertinent labs & imaging results that were available during my care of the patient were reviewed by me and considered in my medical decision making (see chart for details).        Patient presents with a tick bite.  He removed the tick by deeply scratching the skin over her the area of bite.  He does have some erythema at the wound margins concerning for very early cellulitis.  This does not resemble erythema chronicum migrans.  He does not have a rash elsewhere.  Vital signs are normal, no fever.  Will cover with doxycycline for empiric coverage of rickettsial disease as well as cellulitis.  Patient given return precautions.  Final Clinical Impressions(s) / ED Diagnoses   Final diagnoses:  Cellulitis of back except buttock  Tick bite, initial encounter    ED Discharge Orders         Ordered    doxycycline (VIBRAMYCIN) 100 MG capsule  2 times daily     02/17/19 0306           Gilda CreasePollina,  J, MD 02/17/19 [phone]

## 2019-03-25 ENCOUNTER — Other Ambulatory Visit: Payer: Self-pay

## 2019-03-25 NOTE — Patient Outreach (Signed)
[place] [facility]) Care Management  03/25/2019  ZYIR GASSERT [birth year] [id number]   Medication Adherence call to Mr. Aleczander Fandino patient has a disconnected number patient is showing past due under [place].   [place] Management Direct Dial [phone]  Fax [phone] [email or web address]

## 2019-04-08 ENCOUNTER — Other Ambulatory Visit: Payer: Self-pay

## 2019-04-08 NOTE — Patient Outreach (Signed)
[place] [facility]) Care Management  04/08/2019  KALIN KYLER 1950-11-19 [id number]   Medication Adherence call to Mr. Zubin Pontillo patient's telephone number is disconnected patient is showing past due under [place],   [place] Management Direct Dial [phone]  Fax [phone] [email or web address]

## 2019-05-16 IMAGING — MR MR LUMBAR SPINE W/O CM
4 of 5 series · 15 of 48 positions shown · non-contrast
Comparison: Lumbar spine x-rays dated August 07, 2018.

CLINICAL DATA: Acute on chronic low back pain with right leg pain.

EXAM:
MRI LUMBAR SPINE WITHOUT CONTRAST
TECHNIQUE: Multiplanar, multisequence MR imaging of the lumbar spine was
performed. No intravenous contrast was administered.

[Series 3: T2 · sagittal · 4.0mm · 0.78mm/px · 6 of 17 slices shown (1 of 2)]
[im 1/17]
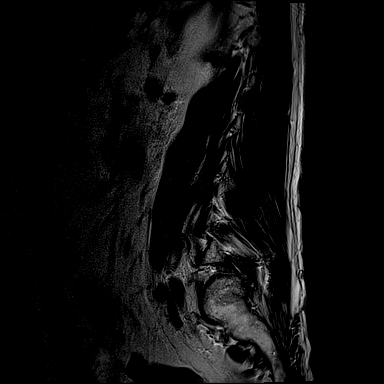
[im 4/17]
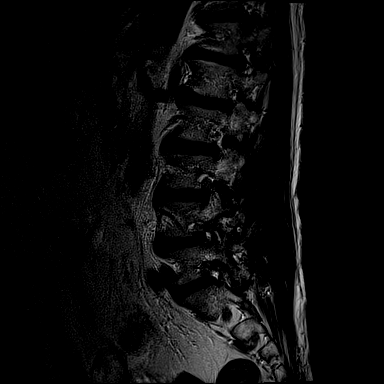
[im 7/17]
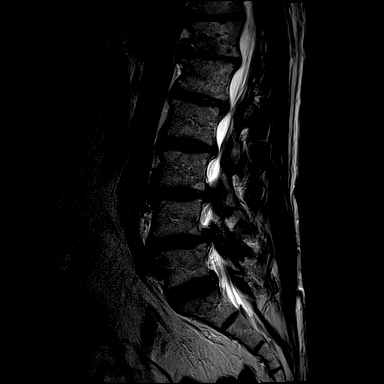
[im 10/17]
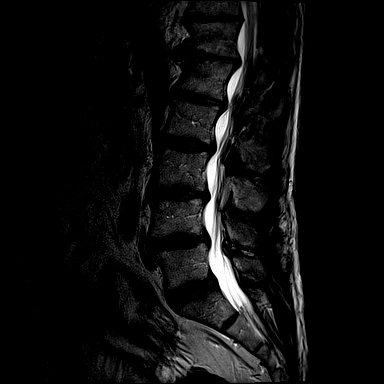
[im 13/17]
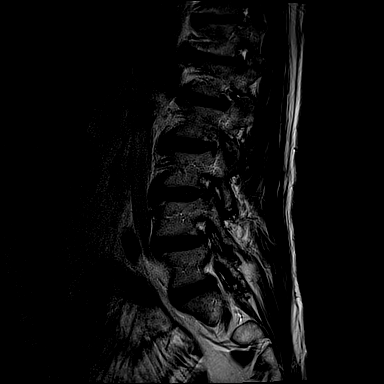
[im 17/17]
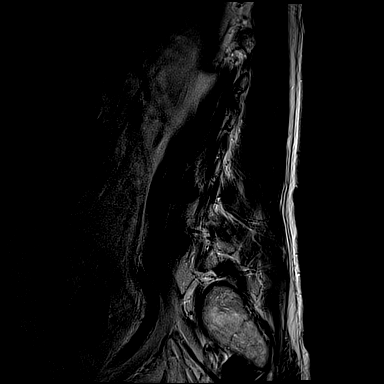

[Series 4: T1 · sagittal · 4.0mm · 0.39mm/px · 3 of 17 slices shown (1 of 2)]
[im 4/17]
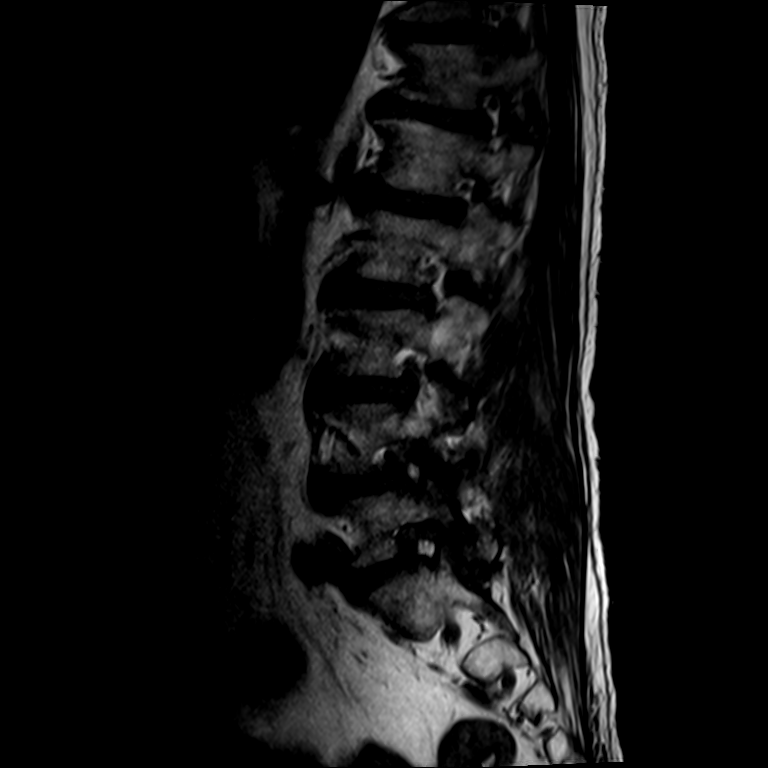
[im 10/17]
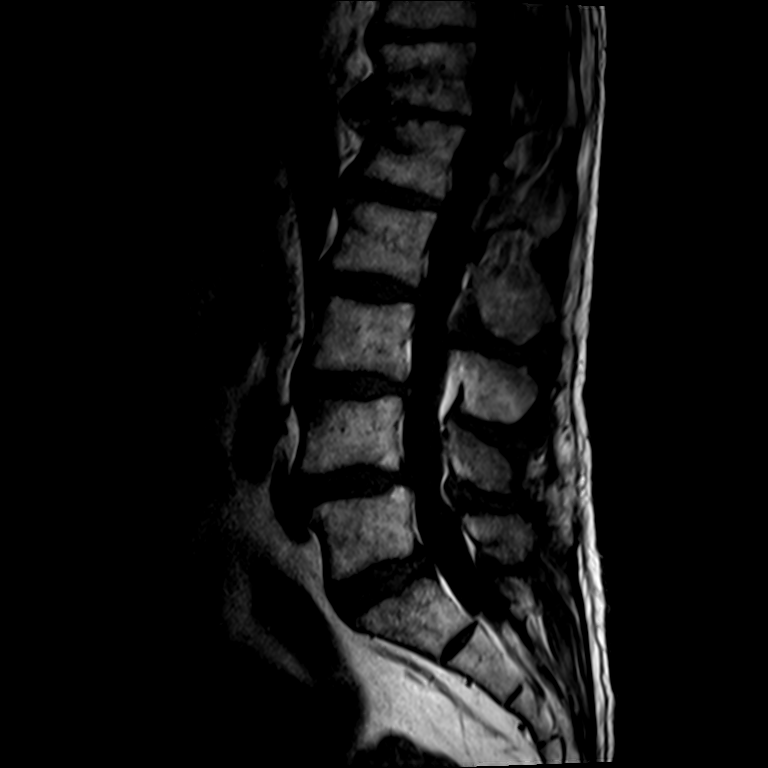
[im 17/17]
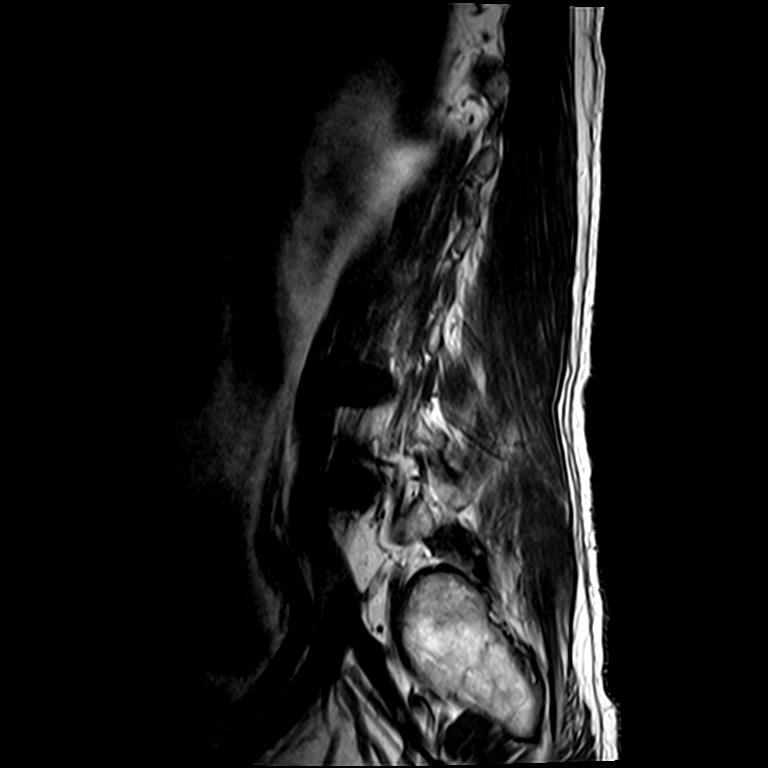

[Series 6: T2 · axial · 4.0mm · 0.29mm/px · z∈[-111,+47]mm · 3 of 42 slices shown (2 of 2)]
[im 6/42]
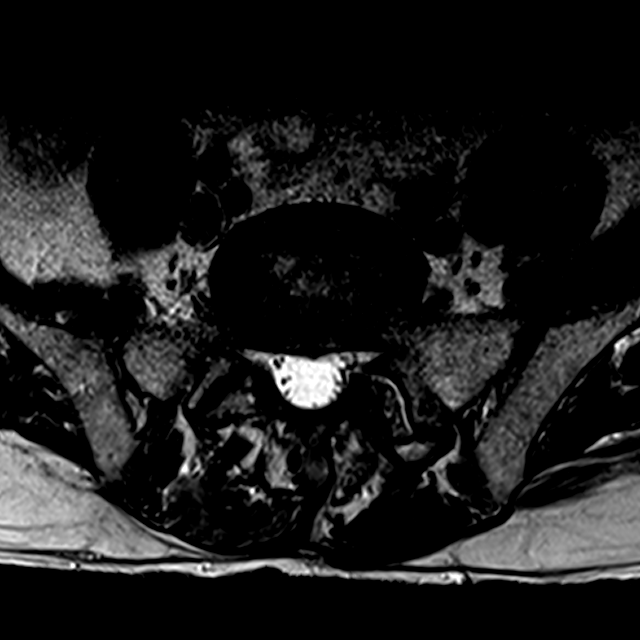
[im 21/42]
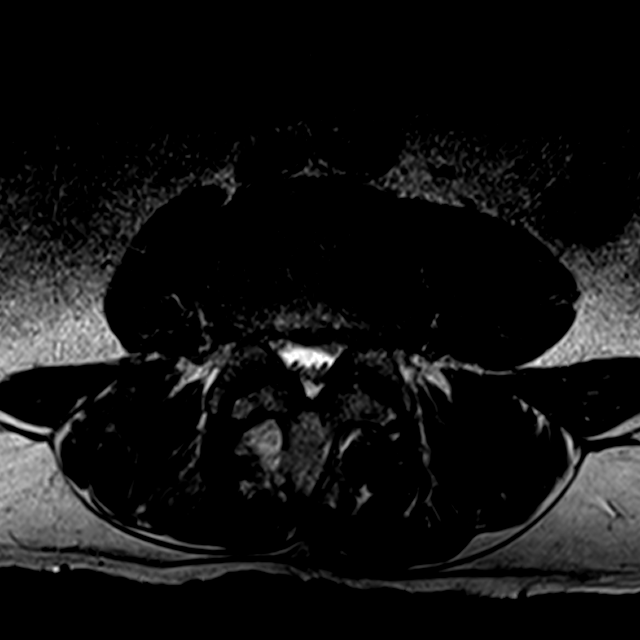
[im 36/42]
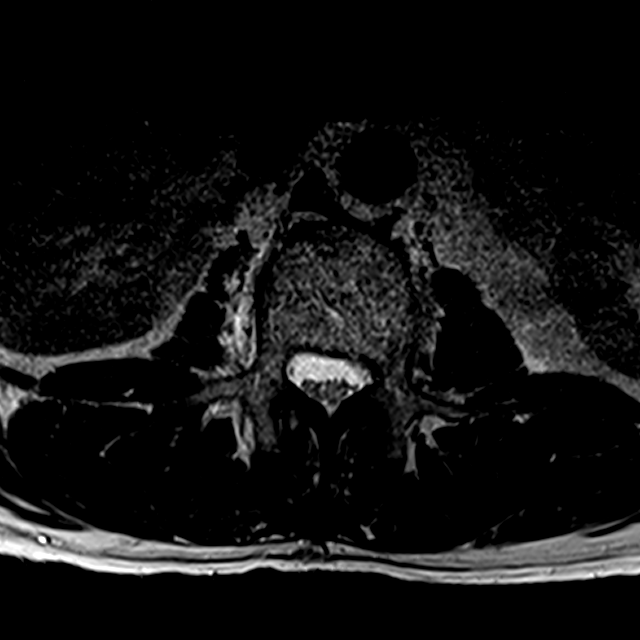

[Series 7: T1 · axial · 4.0mm · 0.29mm/px · z∈[-111,+47]mm · 3 of 42 slices shown (2 of 2)]
[im 6/42]
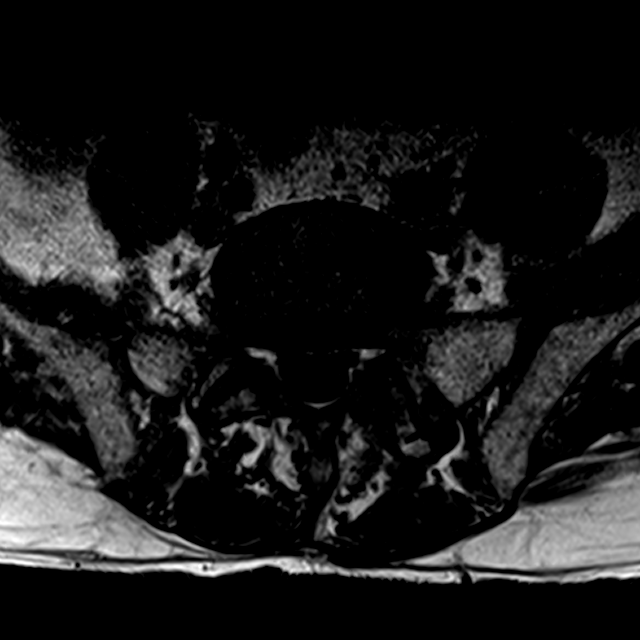
[im 21/42]
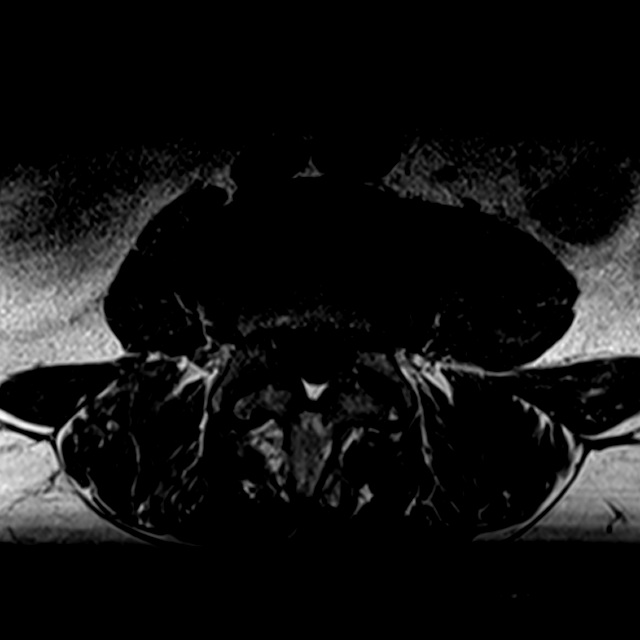
[im 36/42]
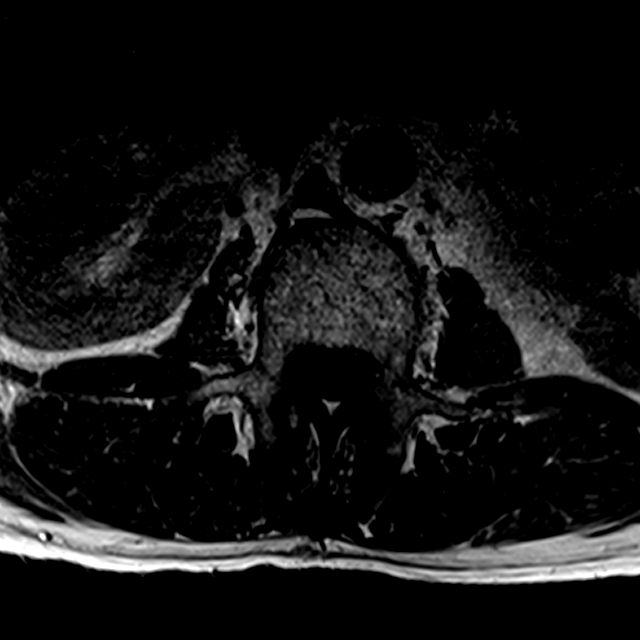

[15 of 48 positions shown; findings below may reference images not displayed]

FINDINGS: Segmentation:  Standard.

Alignment: Trace retrolisthesis at L1-L2. 5 mm anterolisthesis at
L4-L5.

Vertebrae: No fracture, evidence of discitis, or focal bone lesion.
Faint degenerative marrow edema at T12-L1 and L3-L4. Mild marrow
edema in the left L5 pedicle, likely stress related.

Conus medullaris and cauda equina: Conus extends to the L1-L2 level.
Conus and cauda equina appear normal.

Paraspinal and other soft tissues: Negative.

Disc levels:

T11-T12: Only seen on the sagittal images. Disc height loss and tiny
central protrusion. No stenosis.

T12-L1: Only seen on the sagittal images. Small diffuse disc bulge
with endplate spurring. Borderline mild left neuroforaminal
stenosis.

L1-L2: Small diffuse disc bulge with superimposed left lateral
recess and foraminal disc protrusion. Mild central spinal canal and
severe left lateral recess stenosis. Moderate bilateral
neuroforaminal stenosis.

L2-L3: Diffuse disc bulge, slightly eccentric to the left. Mild
central spinal canal stenosis. Moderate left and mild right lateral
recess stenosis. Severe right and moderate left neuroforaminal
stenosis.

L3-L4: Diffuse disc bulge with small superimposed left foraminal
disc protrusion. Mild central spinal canal and lateral recess
stenosis. Moderate left greater than right neuroforaminal stenosis.

L4-L5: Diffuse disc bulge with shallow left subarticular and
foraminal disc protrusion. Moderate bilateral facet arthropathy.
Mild central spinal canal and right lateral recess stenosis. Severe
left lateral recess stenosis. Moderate left and mild right
neuroforaminal stenosis.

L5-S1: Shallow broad-based left foraminal disc protrusion. Moderate
left and mild right facet arthropathy. Moderate left and mild right
neuroforaminal stenosis. No spinal canal stenosis.
IMPRESSION: 1. Multilevel degenerative changes of the lumbar spine as described
above with mild spinal canal stenosis from L1-L2 through L4-L5.
2. Severe left lateral recess stenosis at L1-L2 and L4-L5.
3. Severe right neuroforaminal stenosis at L2-L3. Moderate
neuroforaminal stenosis at the remaining levels.

## 2019-05-28 DIAGNOSIS — J449 Chronic obstructive pulmonary disease, unspecified: Secondary | ICD-10-CM | POA: Diagnosis not present

## 2019-05-28 DIAGNOSIS — K219 Gastro-esophageal reflux disease without esophagitis: Secondary | ICD-10-CM | POA: Diagnosis not present

## 2019-05-28 DIAGNOSIS — Z0001 Encounter for general adult medical examination with abnormal findings: Secondary | ICD-10-CM | POA: Diagnosis not present

## 2019-05-28 DIAGNOSIS — E785 Hyperlipidemia, unspecified: Secondary | ICD-10-CM | POA: Diagnosis not present

## 2019-05-28 DIAGNOSIS — I10 Essential (primary) hypertension: Secondary | ICD-10-CM | POA: Diagnosis not present

## 2019-05-29 DIAGNOSIS — M25561 Pain in right knee: Secondary | ICD-10-CM | POA: Diagnosis not present

## 2019-05-29 DIAGNOSIS — M1711 Unilateral primary osteoarthritis, right knee: Secondary | ICD-10-CM | POA: Diagnosis not present

## 2019-06-04 DIAGNOSIS — M25561 Pain in right knee: Secondary | ICD-10-CM | POA: Diagnosis not present

## 2019-06-04 DIAGNOSIS — M1711 Unilateral primary osteoarthritis, right knee: Secondary | ICD-10-CM | POA: Diagnosis not present

## 2019-06-11 DIAGNOSIS — M25561 Pain in right knee: Secondary | ICD-10-CM | POA: Diagnosis not present

## 2019-06-11 DIAGNOSIS — M1711 Unilateral primary osteoarthritis, right knee: Secondary | ICD-10-CM | POA: Diagnosis not present

## 2019-07-07 DIAGNOSIS — Z79899 Other long term (current) drug therapy: Secondary | ICD-10-CM | POA: Diagnosis not present

## 2019-07-07 DIAGNOSIS — E559 Vitamin D deficiency, unspecified: Secondary | ICD-10-CM | POA: Diagnosis not present

## 2019-07-07 DIAGNOSIS — E785 Hyperlipidemia, unspecified: Secondary | ICD-10-CM | POA: Diagnosis not present

## 2019-07-07 DIAGNOSIS — I10 Essential (primary) hypertension: Secondary | ICD-10-CM | POA: Diagnosis not present

## 2019-07-07 DIAGNOSIS — I25700 Atherosclerosis of coronary artery bypass graft(s), unspecified, with unstable angina pectoris: Secondary | ICD-10-CM | POA: Diagnosis not present

## 2019-10-07 DIAGNOSIS — I10 Essential (primary) hypertension: Secondary | ICD-10-CM | POA: Diagnosis not present

## 2019-10-07 DIAGNOSIS — E785 Hyperlipidemia, unspecified: Secondary | ICD-10-CM | POA: Diagnosis not present

## 2019-10-09 DIAGNOSIS — M13841 Other specified arthritis, right hand: Secondary | ICD-10-CM | POA: Diagnosis not present

## 2019-10-09 DIAGNOSIS — I10 Essential (primary) hypertension: Secondary | ICD-10-CM | POA: Diagnosis not present

## 2019-10-09 DIAGNOSIS — E785 Hyperlipidemia, unspecified: Secondary | ICD-10-CM | POA: Diagnosis not present

## 2019-10-09 DIAGNOSIS — I2510 Atherosclerotic heart disease of native coronary artery without angina pectoris: Secondary | ICD-10-CM | POA: Diagnosis not present

## 2019-10-09 DIAGNOSIS — M2550 Pain in unspecified joint: Secondary | ICD-10-CM | POA: Diagnosis not present

## 2019-10-27 ENCOUNTER — Other Ambulatory Visit: Payer: Self-pay | Admitting: Cardiovascular Disease

## 2019-10-30 ENCOUNTER — Other Ambulatory Visit: Payer: Self-pay | Admitting: Cardiovascular Disease

## 2019-11-19 DIAGNOSIS — M170 Bilateral primary osteoarthritis of knee: Secondary | ICD-10-CM | POA: Diagnosis not present

## 2019-11-19 DIAGNOSIS — M25562 Pain in left knee: Secondary | ICD-10-CM | POA: Diagnosis not present

## 2019-11-19 DIAGNOSIS — M1712 Unilateral primary osteoarthritis, left knee: Secondary | ICD-10-CM | POA: Diagnosis not present

## 2019-11-26 DIAGNOSIS — M1712 Unilateral primary osteoarthritis, left knee: Secondary | ICD-10-CM | POA: Diagnosis not present

## 2019-11-26 DIAGNOSIS — M25562 Pain in left knee: Secondary | ICD-10-CM | POA: Diagnosis not present

## 2019-11-27 ENCOUNTER — Other Ambulatory Visit: Payer: Self-pay

## 2019-11-27 NOTE — Patient Outreach (Signed)
[facility]) Care Management  11/27/2019  Craig Harmon 1951-04-12 [id number]   Medication Adherence call to Mr. Craig Harmon patients telephone number is disconnected \. Craig Harmon is showing past due on Atorvastatin 40 mg and Losartan 25 mg under [employer].   Lillia Abed CPhT Pharmacy Technician [facility] Management Direct Dial [phone]  Fax [phone] [email or web address]

## 2019-12-03 DIAGNOSIS — M1712 Unilateral primary osteoarthritis, left knee: Secondary | ICD-10-CM | POA: Diagnosis not present

## 2019-12-03 DIAGNOSIS — M25562 Pain in left knee: Secondary | ICD-10-CM | POA: Diagnosis not present

## 2021-10-19 ENCOUNTER — Other Ambulatory Visit (HOSPITAL_COMMUNITY): Payer: Self-pay | Admitting: Nurse Practitioner

## 2021-10-19 ENCOUNTER — Other Ambulatory Visit: Payer: Self-pay | Admitting: Nurse Practitioner

## 2021-10-19 ENCOUNTER — Ambulatory Visit (HOSPITAL_COMMUNITY)
Admission: RE | Admit: 2021-10-19 | Discharge: 2021-10-19 | Disposition: A | Discharge status: Home / Self Care | Payer: Medicare Other | Source: Ambulatory Visit | Attending: Nurse Practitioner | Admitting: Nurse Practitioner

## 2021-10-19 ENCOUNTER — Other Ambulatory Visit: Payer: Self-pay

## 2021-10-19 DIAGNOSIS — R221 Localized swelling, mass and lump, neck: Secondary | ICD-10-CM | POA: Diagnosis present

## 2021-10-30 ENCOUNTER — Emergency Department (HOSPITAL_COMMUNITY)
Admission: EM | Admit: 2021-10-30 | Discharge: 2021-10-30 | Disposition: A | Discharge status: Home / Self Care | Payer: Medicare Other | Attending: Emergency Medicine | Admitting: Emergency Medicine

## 2021-10-30 ENCOUNTER — Encounter (HOSPITAL_COMMUNITY): Payer: Self-pay | Admitting: Emergency Medicine

## 2021-10-30 ENCOUNTER — Other Ambulatory Visit: Payer: Self-pay

## 2021-10-30 DIAGNOSIS — F458 Other somatoform disorders: Secondary | ICD-10-CM | POA: Diagnosis not present

## 2021-10-30 DIAGNOSIS — Z7982 Long term (current) use of aspirin: Secondary | ICD-10-CM | POA: Insufficient documentation

## 2021-10-30 DIAGNOSIS — Z79899 Other long term (current) drug therapy: Secondary | ICD-10-CM | POA: Diagnosis not present

## 2021-10-30 DIAGNOSIS — R0989 Other specified symptoms and signs involving the circulatory and respiratory systems: Secondary | ICD-10-CM

## 2021-10-30 DIAGNOSIS — J668 Airway disease due to other specific organic dusts: Secondary | ICD-10-CM | POA: Diagnosis present

## 2021-10-30 DIAGNOSIS — I10 Essential (primary) hypertension: Secondary | ICD-10-CM | POA: Insufficient documentation

## 2021-10-30 HISTORY — DX: Nontoxic goiter, unspecified: E04.9

## 2021-10-30 LAB — BASIC METABOLIC PANEL
Anion gap: 13 (ref 5–15)
BUN: 24 mg/dL — ABNORMAL HIGH (ref 8–23)
CO2: 23 mmol/L (ref 22–32)
Calcium: 9.5 mg/dL (ref 8.9–10.3)
Chloride: 99 mmol/L (ref 98–111)
Creatinine, Ser: 0.75 mg/dL (ref 0.61–1.24)
GFR, Estimated: >60 mL/min (ref >60)
Glucose, Bld: 87 mg/dL (ref 70–99)
Potassium: 3.9 mmol/L (ref 3.5–5.1)
Sodium: 135 mmol/L (ref 135–145)

## 2021-10-30 LAB — CBC
HCT: 40.1 % (ref 39.0–52.0)
Hemoglobin: 13.1 g/dL (ref 13.0–17.0)
MCH: 27.1 pg (ref 26.0–34.0)
MCHC: 32.7 g/dL (ref 30.0–36.0)
MCV: 83 fL (ref 80.0–100.0)
Platelets: 306 10*3/uL (ref 150–400)
RBC: 4.83 MIL/uL (ref 4.22–5.81)
RDW: 17.4 % — ABNORMAL HIGH (ref 11.5–15.5)
WBC: 7.4 10*3/uL (ref 4.0–10.5)
nRBC: 0 % (ref 0.0–0.2)

## 2021-10-30 LAB — TSH: TSH: 2.287 u[IU]/mL (ref 0.350–4.500)

## 2021-10-30 MED ORDER — PANTOPRAZOLE SODIUM 40 MG PO TBEC
40.0000 mg | DELAYED_RELEASE_TABLET | Freq: Once | ORAL | Status: AC
  Administered 2021-10-30: 40 mg via ORAL
  Filled 2021-10-30: qty 1

## 2021-10-30 NOTE — ED Provider Triage Note (Signed)
Emergency Medicine Provider Triage Evaluation Note  Craig Harmon , a 71 y.o. male  was evaluated in triage.  Pt complains of sore throat swelling.  Patient states he has a sensation of swelling in his throat.  Sometimes it feels like something is stuck there.  He is not having any difficulty eating or drinking.  Occasionally feels like something comes up his throat.  He saw primary care doctor at the clinic and was referred for thyroid ultrasound.  Patient states he had that done but does not know what they are going to do about it.  Review of Systems  Positive: Globus sensation Negative: No dyspnea  Physical Exam  BP (S) (!) 152/100 (BP Location: Right Arm)    Pulse 93    Temp 98.3 F (36.8 C) (Oral)    Resp 18    Ht 1.727 m (5\' 8" )    Wt 94.3 kg    SpO2 100%    BMI 31.63 kg/m  Gen:   Awake, no distress   Resp:  Normal effort, no stridor MSK:   Moves extremities without difficulty  Other:  No mass appreciated in the neck, no edema, speaking without difficulty,  Medical Decision Making  Medically screening exam initiated at 3:54 PM.  Appropriate orders placed.  Craig Harmon was informed that the remainder of the evaluation will be completed by another provider, this initial triage assessment does not replace that evaluation, and the importance of remaining in the ED until their evaluation is complete.     Craig Eaton, MD 10/30/21 [phone]

## 2021-10-30 NOTE — ED Provider Notes (Signed)
[facility] EMERGENCY DEPARTMENT Provider Note   CSN: [id number] Arrival date & time: 10/30/21  1458     History Chief Complaint  Patient presents with   Airway Obstruction    Craig Harmon is a 71 y.o. male who presents to the emergency department with the sensation of something stuck in his throat has been ongoing intermittently for 2 months.  Patient able to eat and drink without any difficulty although sometimes if has the sensation that something is stuck in his throat.  He denies any night sweats, unnecessary weight loss, chest pain, sore throat, chronic cough, fever or chills.  He was seen evaluated by his primary care doctor who sent him for a thyroid ultrasound.  HPI     Home Medications Prior to Admission medications   Medication Sig Start Date End Date Taking? Authorizing Provider  albuterol (PROVENTIL HFA;VENTOLIN HFA) 108 (90 BASE) MCG/ACT inhaler Inhale 2 puffs into the lungs every 6 (six) hours as needed for wheezing or shortness of breath. 10/09/13   Suzan Slick, MD  ASPIRIN LOW DOSE 81 MG EC tablet TAKE 1 TABLET BY MOUTH ONCE A DAY. 10/27/19   Laqueta Linden, MD  atorvastatin (LIPITOR) 80 MG tablet Take 1 tablet (80 mg total) by mouth daily. 11/14/17 11/05/18  Laqueta Linden, MD  Cholecalciferol (VITAMIN D3) 1000 units CAPS Take 1 capsule by mouth daily.     [provider]  doxycycline (VIBRAMYCIN) 100 MG capsule Take 1 capsule (100 mg total) by mouth 2 (two) times daily. 02/17/19   Gilda Crease, MD  esomeprazole (NEXIUM) 20 MG capsule Take 20 mg by mouth daily at 12 noon.    [provider]  fluticasone (FLONASE) 50 MCG/ACT nasal spray Place 1 spray into both nostrils daily.    [provider]  isosorbide mononitrate (IMDUR) 30 MG 24 hr tablet TAKE 1 TABLET BY MOUTH ONCE A DAY. 10/31/19   Laqueta Linden, MD  lidocaine (LIDODERM) 5 % Place 1 patch onto the skin daily. Remove & Discard patch within 12 hours or  as directed by MD 08/17/18   Harlene Salts A, PA-C  losartan (COZAAR) 25 MG tablet TAKE ONE TABLET BY MOUTH ONCE DAILY. 01/17/19   Laqueta Linden, MD  methocarbamol (ROBAXIN) 500 MG tablet Take 1 tablet (500 mg total) by mouth 2 (two) times daily. 08/17/18   Harlene Salts A, PA-C  metoprolol succinate (TOPROL-XL) 50 MG 24 hr tablet TAKE ONE TABLET BY MOUTH ONCE DAILY. 04/12/18   Laqueta Linden, MD  nicotine (NICODERM CQ - DOSED IN MG/24 HOURS) 21 mg/24hr patch Place 1 patch (21 mg total) onto the skin daily. 03/28/17   Creig Hines, NP  nitroGLYCERIN (NITROSTAT) 0.4 MG SL tablet Place 1 tablet (0.4 mg total) under the tongue every 5 (five) minutes as needed for chest pain. 05/15/18 08/13/18  Laqueta Linden, MD  traMADol-acetaminophen (ULTRACET) 37.5-325 MG tablet Take 1 tablet by mouth every 4 (four) hours as needed. 11/27/17   Vickki Hearing, MD  umeclidinium bromide (INCRUSE ELLIPTA) 62.5 MCG/INH AEPB Inhale 1 puff into the lungs daily.  08/12/15   [provider]      Allergies    Patient has no known allergies.    Review of Systems   Review of Systems  All other systems reviewed and are negative.  Physical Exam Updated Vital Signs BP (!) 155/96 (BP Location: Right Arm)    Pulse 82    Temp 98.3 F (  36.8 C) (Oral)    Resp 19    Ht 5\' 8"  (1.727 m)    Wt 94.3 kg    SpO2 99%    BMI 31.63 kg/m  Physical Exam Vitals and nursing note reviewed.  Constitutional:      Appearance: Normal appearance.  HENT:     Head: Normocephalic and atraumatic.     Mouth/Throat:     Mouth: Mucous membranes are moist.     Tongue: No lesions.     Palate: No mass and lesions.     Pharynx: Oropharynx is clear. Uvula midline. No pharyngeal swelling.     Tonsils: No tonsillar exudate or tonsillar abscesses.     Comments: Speaking without any difficulty or hoarseness. Eyes:     General:        Right eye: No discharge.        Left eye: No discharge.      Conjunctiva/sclera: Conjunctivae normal.  Pulmonary:     Effort: Pulmonary effort is normal.  Lymphadenopathy:     Cervical: No cervical adenopathy.  Skin:    General: Skin is warm and dry.     Findings: No rash.  Neurological:     General: No focal deficit present.     Mental Status: He is alert.  Psychiatric:        Mood and Affect: Mood normal.        Behavior: Behavior normal.    ED Results / Procedures / Treatments   Labs (all labs ordered are listed, but only abnormal results are displayed) Labs Reviewed  CBC - Abnormal; Notable for the following components:      Result Value   RDW 17.4 (*)    All other components within normal limits  BASIC METABOLIC PANEL - Abnormal; Notable for the following components:   BUN 24 (*)    All other components within normal limits  TSH  T4, FREE    EKG None  Radiology No results found.  Procedures Procedures    Medications Ordered in ED Medications  pantoprazole (PROTONIX) EC tablet 40 mg (40 mg Oral Given 10/30/21 1655)    ED Course/ Medical Decision Making/ A&P                           Medical Decision Making Amount and/or Complexity of Data Reviewed Labs: ordered.  Risk Prescription drug management.    This patient presents to the ED for concern of globus sensation in the throat, this involves an extensive number of treatment options, and is a complaint that carries with it a high risk of complications and morbidity.  The differential diagnosis includes foreign body, thyroid disease, globus sensation, malignancy   Co morbidities that complicate the patient evaluation  Tobacco user Hypertension Enlarged thyroid GERD   Additional history obtained:  Additional history obtained from old records and nursing note External records from outside source obtained and reviewed including thyroid ultrasound which was performed on 10/19/2021.  There is no evidence of thyroid nodules but there was signs of diffuse  heterogenicity.   Lab Tests:  I Ordered, and personally interpreted labs.  The pertinent results include: CBC which was without any leukocytosis or anemia.  BMP which was normal.  TSH was normal.  Free T4 is pending.   Cardiac Monitoring:  The patient was maintained on a cardiac monitor.  I personally viewed and interpreted the cardiac monitored which showed an underlying rhythm of: Normal sinus rhythm  Medicines ordered and prescription drug management:  I ordered medication including pantoprazole for possible reflux Reevaluation of the patient after these medicines showed that the patient stayed the same I have reviewed the patients home medicines and have made adjustments as needed   Problem List / ED Course:  Globus sensation.  Patient able to tolerate liquids without any difficulty and solids without any difficulty.  Labs were reassuring today.  Patient had an outpatient ultrasound of his thyroid done last week which was normal.  Patient has a follow-up appointment with his primary care doctor on March 16.  Told to keep that appointment.  Patient is not in need of emergent care at this time.  He is safe for outpatient follow-up.   Reevaluation:  After the interventions noted above, I reevaluated the patient and found that they have :stayed the same   Social Determinants of Health:  Tobacco use   Dispostion:  After consideration of the diagnostic results and the patients response to treatment, I feel that the patent would benefit from outpatient follow-up.  He does not meet inpatient criteria today.  Final Clinical Impression(s) / ED Diagnoses Final diagnoses:  Globus sensation    Rx / DC Orders ED Discharge Orders     None         Teressa Lower, [place] 10/30/21 1851    Linwood Dibbles, MD 11/01/21 [phone]

## 2021-10-30 NOTE — Discharge Instructions (Signed)
Keep your appointment with your primary care doctor.  Please return to the emergency department for worsening symptoms.

## 2021-10-30 NOTE — ED Triage Notes (Addendum)
Patient states I "feels like something is stuck in my throat." Patient reports being seen by PCP 2 weeks ago and having ultrasound stating thyroid looked enlarged. Patient was instructed to come to ER if feeling got worse. Patient denies any oral swelling. Per patient more like the sensation of "lump in throat." Airway patent. Patient able to handle oral secretions. O2 sat 100% on room air.

## 2021-10-31 LAB — T4, FREE: Free T4: 0.78 ng/dL (ref 0.61–1.12)

## 2021-11-23 ENCOUNTER — Ambulatory Visit (INDEPENDENT_AMBULATORY_CARE_PROVIDER_SITE_OTHER): Payer: Medicare Other | Admitting: Nurse Practitioner

## 2021-11-23 ENCOUNTER — Encounter: Payer: Self-pay | Admitting: Nurse Practitioner

## 2021-11-23 ENCOUNTER — Other Ambulatory Visit: Payer: Self-pay

## 2021-11-23 VITALS — BP 122/76 | HR 92 | Ht 67.0 in | Wt 213.0 lb

## 2021-11-23 DIAGNOSIS — R9389 Abnormal findings on diagnostic imaging of other specified body structures: Secondary | ICD-10-CM

## 2021-11-23 NOTE — Patient Instructions (Signed)

## 2021-11-23 NOTE — Progress Notes (Signed)
?                                   ?                                Endocrinology Consult Note  ?                                       11/23/2021, 2:17 PM ? ?Subjective:  ? ?Subjective   ? ?Craig Harmon is a 71 y.o.-year-old male patient being seen in consultation for hypothyroidism referred by Pearson Grippe, MD. ? ? ?Past Medical History:  ?Diagnosis Date  ? Arthritis   ? CAD (coronary artery disease)   ? a. s/p prior CABG x 3 9LIMA->LAD, VG->RPDA, VG->OM;  b. s/p prior stenting of OM1;  c. 03/2017 Cath/PCI: LAD 100, LCX 90p (3.5x20 Synergy DES), OM1 90 ISR (CBA), RCA 100p, LIMA->LAD ok, VG->OM 100, VG->RPDA 100.  ? Enlarged thyroid   ? GERD (gastroesophageal reflux disease)   ? HLD (hyperlipidemia)   ? HTN (hypertension)   ? Moderate aortic stenosis   ? a. 03/2017 Echo: EF 60-65%, Gr2 DD, mod AS.  ? Tobacco abuse   ? ? ?Past Surgical History:  ?Procedure Laterality Date  ? BACK SURGERY    ? CORONARY ARTERY BYPASS GRAFT  2008  ? CORONARY BALLOON ANGIOPLASTY N/A 03/26/2017  ? Procedure: Coronary Balloon Angioplasty;  Surgeon: Tonny Bollman, MD;  Location: [facility] INVASIVE CV LAB;  Service: Cardiovascular;  Laterality: N/A;  ? CORONARY STENT INTERVENTION N/A 03/26/2017  ? Procedure: Coronary Stent Intervention;  Surgeon: Tonny Bollman, MD;  Location: [facility] INVASIVE CV LAB;  Service: Cardiovascular;  Laterality: N/A;  ? RIGHT/LEFT HEART CATH AND CORONARY/GRAFT ANGIOGRAPHY N/A 03/26/2017  ? Procedure: Right/Left Heart Cath and Coronary/Graft Angiography;  Surgeon: Tonny Bollman, MD;  Location: [facility] INVASIVE CV LAB;  Service: Cardiovascular;  Laterality: N/A;  ? ? ?Social History  ? ?Socioeconomic History  ? Marital status: Divorced  ?  Spouse name: Not on file  ? Number of children: Not on file  ? Years of education: Not on file  ? Highest education level: Not on file  ?Occupational History  ? Not on file  ?Tobacco Use  ? Smoking status: Every Day  ?  Packs/day: 2.00  ?  Years: 45.00  ?  Pack years: 90.00  ?  Types:  Cigarettes  ?  Last attempt to quit: 08/11/2016  ?  Years since quitting: 5.2  ? Smokeless tobacco: Never  ?Vaping Use  ? Vaping Use: Never used  ?Substance and Sexual Activity  ? Alcohol use: Yes  ?  Comment: occasionally  ? Drug use: No  ? Sexual activity: Never  ?Other Topics Concern  ? Not on file  ?Social History Narrative  ? Not on file  ? ?Social Determinants of Health  ? ?Financial Resource Strain: Not on file  ?Food Insecurity: Not on file  ?Transportation Needs: Not on file  ?Physical Activity: Not on file  ?Stress: Not on file  ?Social Connections: Not on file  ? ? ?Family History  ?Problem Relation Age of Onset  ? Heart attack Mother   ? Alcohol abuse Father   ? CAD Brother   ? ? ?Outpatient Encounter Medications as  of 11/23/2021  ?Medication Sig  ? albuterol (PROVENTIL HFA;VENTOLIN HFA) 108 (90 BASE) MCG/ACT inhaler Inhale 2 puffs into the lungs every 6 (six) hours as needed for wheezing or shortness of breath.  ? atorvastatin (LIPITOR) 80 MG tablet Take 80 mg by mouth daily.  ? Cholecalciferol (VITAMIN D3) 1000 units CAPS Take 1 capsule by mouth daily.   ? gabapentin (NEURONTIN) 300 MG capsule Take 300 mg by mouth daily.  ? lisinopril-hydrochlorothiazide (ZESTORETIC) 20-12.5 MG tablet Take 1 tablet by mouth daily.  ? nitroGLYCERIN (NITROSTAT) 0.4 MG SL tablet Place 1 tablet (0.4 mg total) under the tongue every 5 (five) minutes as needed for chest pain.  ? omeprazole (PRILOSEC) 20 MG capsule Take 20 mg by mouth daily.  ? Vitamin D, Ergocalciferol, (DRISDOL) 1.25 MG (50000 UNIT) CAPS capsule Take 50,000 Units by mouth once a week.  ? [DISCONTINUED] ASPIRIN LOW DOSE 81 MG EC tablet TAKE 1 TABLET BY MOUTH ONCE A DAY.  ? [DISCONTINUED] atorvastatin (LIPITOR) 80 MG tablet Take 1 tablet (80 mg total) by mouth daily.  ? [DISCONTINUED] doxycycline (VIBRAMYCIN) 100 MG capsule Take 1 capsule (100 mg total) by mouth 2 (two) times daily.  ? [DISCONTINUED] esomeprazole (NEXIUM) 20 MG capsule Take 20 mg by mouth daily  at 12 noon.  ? [DISCONTINUED] fluticasone (FLONASE) 50 MCG/ACT nasal spray Place 1 spray into both nostrils daily.  ? [DISCONTINUED] isosorbide mononitrate (IMDUR) 30 MG 24 hr tablet TAKE 1 TABLET BY MOUTH ONCE A DAY.  ? [DISCONTINUED] lidocaine (LIDODERM) 5 % Place 1 patch onto the skin daily. Remove & Discard patch within 12 hours or as directed by MD  ? [DISCONTINUED] losartan (COZAAR) 25 MG tablet TAKE ONE TABLET BY MOUTH ONCE DAILY.  ? [DISCONTINUED] methocarbamol (ROBAXIN) 500 MG tablet Take 1 tablet (500 mg total) by mouth 2 (two) times daily.  ? [DISCONTINUED] metoprolol succinate (TOPROL-XL) 50 MG 24 hr tablet TAKE ONE TABLET BY MOUTH ONCE DAILY.  ? [DISCONTINUED] nicotine (NICODERM CQ - DOSED IN MG/24 HOURS) 21 mg/24hr patch Place 1 patch (21 mg total) onto the skin daily.  ? [DISCONTINUED] traMADol-acetaminophen (ULTRACET) 37.5-325 MG tablet Take 1 tablet by mouth every 4 (four) hours as needed.  ? [DISCONTINUED] umeclidinium bromide (INCRUSE ELLIPTA) 62.5 MCG/INH AEPB Inhale 1 puff into the lungs daily.   ? ?No facility-administered encounter medications on file as of 11/23/2021.  ? ? ?ALLERGIES: ?No Known Allergies ?VACCINATION STATUS: ? ?There is no immunization history on file for this patient. ? ? ?HPI  ? ?Craig Harmon  is a patient with the above medical history. He has been having trouble swallowing mostly harder foods for about 2 months now, therefore his PCP ordered an ultrasound which showed some heterogeneous thyroid tissue.  Thus he is sent here for further work up. ? ?I reviewed patient's thyroid tests: ? ?Lab Results  ?Component Value Date  ? TSH 2.287 10/30/2021  ? TSH 0.758 12/24/2013  ? FREET4 0.78 10/30/2021  ? FREET4 1.19 12/24/2013  ?  ? ?Pt describes: ?- weight gain ?- hair loss ? ?Pt denies feeling nodules in neck, hoarseness, odynophagia, SOB with lying down.  Does have dysphagia- worse with solid foods. ? ?he denies family history of thyroid disorders.  No family history of  thyroid cancer.  ?No history of radiation therapy to head or neck.  No recent use of iodine supplements.  Denies use of Biotin containing supplements. ? ?I reviewed his chart and he also has a history of CAD, HTN, GERD. ? ? ?  ROS: ? ?Constitutional: + weight gain, no fatigue, no subjective hyperthermia, no subjective hypothermia ?Eyes: no blurry vision, no xerophthalmia ?ENT: no sore throat, no nodules palpated in throat, no dysphagia/odynophagia, no hoarseness ?Cardiovascular: no chest pain, no SOB, no palpitations, no leg swelling ?Respiratory: no cough, no SOB ?Gastrointestinal: no nausea/vomiting/diarrhea ?Musculoskeletal: no muscle/joint aches ?Skin: no rashes, + hair loss ?Neurological: no tremors, no numbness, no tingling, no dizziness ?Psychiatric: no depression, no anxiety ? ? ?Objective:  ? ?Objective   ? ? ?BP 122/76   Pulse 92   Ht 5\' 7"  (1.702 m)   Wt 213 lb (96.6 kg)   BMI 33.36 kg/m?  ?Wt Readings from Last 3 Encounters:  ?11/23/21 213 lb (96.6 kg)  ?10/30/21 208 lb (94.3 kg)  ?02/17/19 180 lb (81.6 kg)  ? ? ?BP Readings from Last 3 Encounters:  ?11/23/21 122/76  ?10/30/21 (!) 171/111  ?02/17/19 (!) 152/93  ?  ? ?Constitutional:  Body mass index is 33.36 kg/m?., not in acute distress, normal state of mind ?Eyes: PERRLA, EOMI, no exophthalmos ?ENT: moist mucous membranes, no thyromegaly, no cervical lymphadenopathy ?Cardiovascular: normal precordial activity, RRR, no murmur/rubs/gallops ?Respiratory:  adequate breathing efforts, no gross chest deformity, Clear to auscultation bilaterally ?Gastrointestinal: abdomen soft, non-tender, no distension, bowel sounds present ?Musculoskeletal: no gross deformities, strength intact in all four extremities ?Skin: moist, warm, no rashes ?Neurological: no tremor with outstretched hands, deep tendon reflexes normal in BLE. ? ? ?CMP ( most recent) ?CMP  ?   ?Component Value Date/Time  ? NA 135 10/30/2021 1608  ? K 3.9 10/30/2021 1608  ? CL 99 10/30/2021 1608  ?  CO2 23 10/30/2021 1608  ? GLUCOSE 87 10/30/2021 1608  ? BUN 24 (H) 10/30/2021 1608  ? CREATININE 0.75 10/30/2021 1608  ? CREATININE 0.68 12/24/2013 0959  ? CALCIUM 9.5 10/30/2021 1608  ? PROT 6.9 06/24/2013 1119  ? AL

## 2021-11-24 LAB — T3, FREE: T3, Free: 2.8 pg/mL (ref 2.0–4.4)

## 2021-11-24 LAB — THYROGLOBULIN ANTIBODY: Thyroglobulin Antibody: <1 IU/mL (ref 0.0–0.9)

## 2021-11-24 LAB — TSH: TSH: 1.16 u[IU]/mL (ref 0.450–4.500)

## 2021-11-24 LAB — THYROID PEROXIDASE ANTIBODY: Thyroperoxidase Ab SerPl-aCnc: <9 IU/mL (ref 0–34)

## 2021-11-24 LAB — T4, FREE: Free T4: 1.2 ng/dL (ref 0.82–1.77)

## 2021-11-30 ENCOUNTER — Encounter: Payer: Self-pay | Admitting: Nurse Practitioner

## 2021-11-30 ENCOUNTER — Other Ambulatory Visit (HOSPITAL_COMMUNITY): Payer: Self-pay | Admitting: Nurse Practitioner

## 2021-11-30 ENCOUNTER — Ambulatory Visit (INDEPENDENT_AMBULATORY_CARE_PROVIDER_SITE_OTHER): Payer: Medicare Other | Admitting: Nurse Practitioner

## 2021-11-30 VITALS — BP 161/70 | HR 43 | Ht 67.0 in | Wt 209.4 lb

## 2021-11-30 DIAGNOSIS — I2510 Atherosclerotic heart disease of native coronary artery without angina pectoris: Secondary | ICD-10-CM

## 2021-11-30 DIAGNOSIS — R9389 Abnormal findings on diagnostic imaging of other specified body structures: Secondary | ICD-10-CM

## 2021-11-30 NOTE — Progress Notes (Signed)
?                                   ?                                Endocrinology Consult Note  ?                                       11/30/2021, 2:30 PM ? ?Subjective:  ? ?Subjective   ? ?Craig Harmon is a 71 y.o.-year-old male patient being seen in consultation for hypothyroidism referred by Pearson GrippeKim, James, MD. ? ? ?Past Medical History:  ?Diagnosis Date  ? Arthritis   ? CAD (coronary artery disease)   ? a. s/p prior CABG x 3 9LIMA->LAD, VG->RPDA, VG->OM;  b. s/p prior stenting of OM1;  c. 03/2017 Cath/PCI: LAD 100, LCX 90p (3.5x20 Synergy DES), OM1 90 ISR (CBA), RCA 100p, LIMA->LAD ok, VG->OM 100, VG->RPDA 100.  ? Enlarged thyroid   ? GERD (gastroesophageal reflux disease)   ? HLD (hyperlipidemia)   ? HTN (hypertension)   ? Moderate aortic stenosis   ? a. 03/2017 Echo: EF 60-65%, Gr2 DD, mod AS.  ? Tobacco abuse   ? ? ?Past Surgical History:  ?Procedure Laterality Date  ? BACK SURGERY    ? CORONARY ARTERY BYPASS GRAFT  2008  ? CORONARY BALLOON ANGIOPLASTY N/A 03/26/2017  ? Procedure: Coronary Balloon Angioplasty;  Surgeon: Tonny Bollmanooper, Michael, MD;  [facility];  Service: Cardiovascular;  Laterality: N/A;  ? CORONARY STENT INTERVENTION N/A 03/26/2017  ? Procedure: Coronary Stent Intervention;  Surgeon: Tonny Bollmanooper, Michael, MD;  [facility];  Service: Cardiovascular;  Laterality: N/A;  ? RIGHT/LEFT HEART CATH AND CORONARY/GRAFT ANGIOGRAPHY N/A 03/26/2017  ? Procedure: Right/Left Heart Cath and Coronary/Graft Angiography;  Surgeon: Tonny Bollmanooper, Michael, MD;  [facility];  Service: Cardiovascular;  Laterality: N/A;  ? ? ?Social History  ? ?Socioeconomic History  ? Marital status: Divorced  ?  Spouse name: Not on file  ? Number of children: Not on file  ? Years of education: Not on file  ? Highest education level: Not on file  ?Occupational History  ? Not on file  ?Tobacco Use  ? Smoking status: Every Day  ?  Packs/day: 2.00  ?  Years: 45.00  ?  Pack years: 90.00  ?  Types:  Cigarettes  ?  Last attempt to quit: 08/11/2016  ?  Years since quitting: 5.3  ? Smokeless tobacco: Never  ?Vaping Use  ? Vaping Use: Never used  ?Substance and Sexual Activity  ? Alcohol use: Yes  ?  Comment: occasionally  ? Drug use: No  ? Sexual activity: Never  ?Other Topics Concern  ? Not on file  ?Social History Narrative  ? Not on file  ? ?Social Determinants of Health  ? ?Financial Resource Strain: Not on file  ?Food Insecurity: Not on file  ?Transportation Needs: Not on file  ?Physical Activity: Not on file  ?Stress: Not on file  ?Social Connections: Not on file  ? ? ?Family History  ?Problem Relation Age of Onset  ? Heart attack Mother   ? Alcohol abuse Father   ? CAD Brother   ? ? ?Outpatient Encounter Medications as  of 11/30/2021  ?Medication Sig  ? albuterol (PROVENTIL HFA;VENTOLIN HFA) 108 (90 BASE) MCG/ACT inhaler Inhale 2 puffs into the lungs every 6 (six) hours as needed for wheezing or shortness of breath.  ? atorvastatin (LIPITOR) 80 MG tablet Take 80 mg by mouth daily.  ? Cholecalciferol (VITAMIN D3) 1000 units CAPS Take 1 capsule by mouth daily.   ? famotidine (PEPCID) 20 MG tablet Take by mouth.  ? fluticasone (FLONASE) 50 MCG/ACT nasal spray Place 1 spray into both nostrils as needed.  ? gabapentin (NEURONTIN) 300 MG capsule Take 300 mg by mouth daily.  ? lisinopril-hydrochlorothiazide (ZESTORETIC) 20-12.5 MG tablet Take 1 tablet by mouth daily.  ? naproxen (NAPROSYN) 500 MG tablet Take by mouth.  ? omeprazole (PRILOSEC) 20 MG capsule Take 20 mg by mouth daily.  ? Vitamin D, Ergocalciferol, (DRISDOL) 1.25 MG (50000 UNIT) CAPS capsule Take 50,000 Units by mouth once a week.  ? nitroGLYCERIN (NITROSTAT) 0.4 MG SL tablet Place 1 tablet (0.4 mg total) under the tongue every 5 (five) minutes as needed for chest pain.  ? ?No facility-administered encounter medications on file as of 11/30/2021.  ? ? ?ALLERGIES: ?No Known Allergies ?VACCINATION STATUS: ? ?There is no immunization history on file for this  patient. ? ? ?HPI  ? ?Craig Harmon  is a patient with the above medical history. He has been having trouble swallowing mostly harder foods for about 2 months now, therefore his PCP ordered an ultrasound which showed some heterogeneous thyroid tissue.  Thus he is sent here for further work up. ? ?I reviewed patient's thyroid tests: ? ?Lab Results  ?Component Value Date  ? TSH 1.160 11/23/2021  ? TSH 2.287 10/30/2021  ? TSH 0.758 12/24/2013  ? FREET4 1.20 11/23/2021  ? FREET4 0.78 10/30/2021  ? FREET4 1.19 12/24/2013  ?  ? ?Pt describes: ?- weight gain ?- hair loss ? ?Pt denies feeling nodules in neck, hoarseness, odynophagia, SOB with lying down.  Does have dysphagia- worse with solid foods. ? ?he denies family history of thyroid disorders.  No family history of thyroid cancer.  ?No history of radiation therapy to head or neck.  No recent use of iodine supplements.  Denies use of Biotin containing supplements. ? ?I reviewed his chart and he also has a history of CAD, HTN, GERD. ? ? ?Review of systems ? ?Constitutional: + Minimally fluctuating body weight,  current Body mass index is 32.8 kg/m?. , no fatigue, no subjective hyperthermia, no subjective hypothermia ?Eyes: no blurry vision, no xerophthalmia ?ENT: no sore throat, no nodules palpated in throat, + dysphagia-worse with dense solids, no odynophagia, no hoarseness ?Cardiovascular: no chest pain, no shortness of breath, no palpitations, no leg swelling ?Respiratory: no cough, no shortness of breath ?Gastrointestinal: no nausea/vomiting/diarrhea ?Musculoskeletal: no muscle/joint aches ?Skin: no rashes, no hyperemia ?Neurological: no tremors, no numbness, no tingling, no dizziness ?Psychiatric: no depression, no anxiety ? ? ?Objective:  ? ?Objective   ? ? ?BP (!) 161/70   Pulse (!) 43   Ht 5\' 7"  (1.702 m)   Wt 209 lb 6.4 oz (95 kg)   SpO2 99%   BMI 32.80 kg/m?  ?Wt Readings from Last 3 Encounters:  ?11/30/21 209 lb 6.4 oz (95 kg)  ?11/23/21 213 lb (96.6  kg)  ?10/30/21 208 lb (94.3 kg)  ? ? ?BP Readings from Last 3 Encounters:  ?11/30/21 (!) 161/70  ?11/23/21 122/76  ?10/30/21 (!) 171/111  ?  ? ? ?Physical Exam- Limited ? ?Constitutional:  Body  mass index is 32.8 kg/m?. , not in acute distress, normal state of mind ?Eyes:  EOMI, no exophthalmos ?Neck: Supple ?Cardiovascular: RRR, no murmurs, rubs, or gallops, no edema ?Respiratory: Adequate breathing efforts, no crackles, rales, rhonchi, or wheezing ?Musculoskeletal: no gross deformities, strength intact in all four extremities, no gross restriction of joint movements ?Skin:  no rashes, no hyperemia, nicotinic discoloration to fingernails bilaterally ?Neurological: no tremor with outstretched hands ? ? ?CMP ( most recent) ?CMP  ?   ?Component Value Date/Time  ? NA 135 10/30/2021 1608  ? K 3.9 10/30/2021 1608  ? CL 99 10/30/2021 1608  ? CO2 23 10/30/2021 1608  ? GLUCOSE 87 10/30/2021 1608  ? BUN 24 (H) 10/30/2021 1608  ? CREATININE 0.75 10/30/2021 1608  ? CREATININE 0.68 12/24/2013 0959  ? CALCIUM 9.5 10/30/2021 1608  ? PROT 6.9 06/24/2013 1119  ? ALBUMIN 4.6 06/24/2013 1119  ? AST 18 06/24/2013 1119  ? ALT 9 06/24/2013 1119  ? ALKPHOS 90 06/24/2013 1119  ? BILITOT 0.6 06/24/2013 1119  ? GFRNONAA >60 10/30/2021 1608  ? GFRAA >60 03/27/2017 0222  ? ? ? ?Diabetic Labs (most recent): ?No results found for: HGBA1C ? ? Lipid Panel ( most recent) ?Lipid Panel  ?   ?Component Value Date/Time  ? CHOL 168 03/25/2017 0519  ? TRIG 62 03/25/2017 0519  ? HDL 66 03/25/2017 0519  ? CHOLHDL 2.5 03/25/2017 0519  ? VLDL 12 03/25/2017 0519  ? LDLCALC 90 03/25/2017 0519  ? ?  ? ? ?Lab Results  ?Component Value Date  ? TSH 1.160 11/23/2021  ? TSH 2.287 10/30/2021  ? TSH 0.758 12/24/2013  ? FREET4 1.20 11/23/2021  ? FREET4 0.78 10/30/2021  ? FREET4 1.19 12/24/2013  ?  ?Thyroid US from 10/19/21 ?CLINICAL DATA:  Difficulty swallowing for 1 week. Food getting ?caught in throat. ?  ?EXAM: ?THYROID ULTRASOUND ?  ?TECHNIQUE: ?Ultrasound  examination of the thyroid gland and adjacent soft ?tissues was performed. ?  ?COMPARISON:  None. ?  ?FINDINGS: ?Parenchymal Echotexture: Mildly heterogeneous ?  ?Isthmus: 0.3 cm ?  ?Right lobe: 4.9 x 1.6 x 1.9 cm ?  ?Lef

## 2021-12-15 ENCOUNTER — Ambulatory Visit (HOSPITAL_COMMUNITY)
Admission: RE | Admit: 2021-12-15 | Discharge: 2021-12-15 | Disposition: A | Discharge status: Home / Self Care | Payer: Medicare Other | Source: Ambulatory Visit | Attending: Nurse Practitioner | Admitting: Nurse Practitioner

## 2021-12-15 DIAGNOSIS — I2510 Atherosclerotic heart disease of native coronary artery without angina pectoris: Secondary | ICD-10-CM | POA: Diagnosis not present

## 2021-12-15 LAB — ECHOCARDIOGRAM COMPLETE
AR max vel: 0.94 cm2
AV Area VTI: 1.05 cm2
AV Area mean vel: 0.86 cm2
AV Mean grad: 23 mmHg
AV Peak grad: 42.1 mmHg
Ao pk vel: 3.25 m/s
Area-P 1/2: 2.84 cm2
Calc EF: 50.2 %
MV VTI: 3.11 cm2
S' Lateral: 3.55 cm
Single Plane A2C EF: 51.6 %
Single Plane A4C EF: 47.5 %

## 2021-12-15 NOTE — Progress Notes (Signed)
*  PRELIMINARY RESULTS* ?Echocardiogram ?2D Echocardiogram has been performed. ? ?Carolyne Fiscal ?12/15/2021, 2:08 PM ?

## 2022-01-10 ENCOUNTER — Encounter: Payer: Self-pay | Admitting: Internal Medicine

## 2022-01-11 ENCOUNTER — Telehealth: Payer: Self-pay | Admitting: *Deleted

## 2022-01-11 ENCOUNTER — Encounter: Payer: Self-pay | Admitting: Internal Medicine

## 2022-01-11 ENCOUNTER — Encounter: Payer: Self-pay | Admitting: *Deleted

## 2022-01-11 ENCOUNTER — Ambulatory Visit (INDEPENDENT_AMBULATORY_CARE_PROVIDER_SITE_OTHER): Payer: Medicare Other | Admitting: Internal Medicine

## 2022-01-11 ENCOUNTER — Ambulatory Visit: Payer: Medicare Other | Admitting: Internal Medicine

## 2022-01-11 VITALS — BP 139/95 | HR 83 | Temp 97.3°F | Ht 68.0 in | Wt 204.4 lb

## 2022-01-11 DIAGNOSIS — Z1211 Encounter for screening for malignant neoplasm of colon: Secondary | ICD-10-CM | POA: Diagnosis not present

## 2022-01-11 DIAGNOSIS — Z79899 Other long term (current) drug therapy: Secondary | ICD-10-CM

## 2022-01-11 DIAGNOSIS — K219 Gastro-esophageal reflux disease without esophagitis: Secondary | ICD-10-CM

## 2022-01-11 DIAGNOSIS — R1319 Other dysphagia: Secondary | ICD-10-CM | POA: Diagnosis not present

## 2022-01-11 NOTE — H&P (View-Only) (Signed)
? ? ?Primary Care Physician:  Kim, James, MD ?Primary Gastroenterologist:  Dr. Barth Trella ? ?Chief Complaint  ?Patient presents with  ? Dysphagia  ?  Trouble swallowing, only able to eat foods like mashed potatoes. States that every time he swallows it feels like something is stuck in his throat.   ? ? ?HPI:   ?Craig Harmon is a 71 y.o. male who presents to the clinic today by referral from his PCP Rachel Hyler for evaluation.  Patient has chronic reflux which he states is well controlled on omeprazole 20 mg daily.  Notes for the past 2 to 3 months he has had progressively worsening esophageal dysphagia.  Primarily occurs with solids.  States he can eat soft foods such as mashed potatoes though meats will get stuck and he will regurgitate at times.  No nausea.  Significant smoking history.  States he had an EGD many years ago, unsure of findings. ? ?Takes Plavix for CAD.  Status post bypass many years ago.  Recent echo with aortic stenosis, low normal LVEF, right ventricular dysfunction. ? ?No melena hematochezia.  No unintentional weight loss.  No abdominal pain. ? ?Also due for screening colonoscopy.  States his last colonoscopy was many years ago.  No family history of colorectal malignancy. ? ?Past Medical History:  ?Diagnosis Date  ? Arthritis   ? CAD (coronary artery disease)   ? a. s/p prior CABG x 3 9LIMA->LAD, VG->RPDA, VG->OM;  b. s/p prior stenting of OM1;  c. 03/2017 Cath/PCI: LAD 100, LCX 90p (3.5x20 Synergy DES), OM1 90 ISR (CBA), RCA 100p, LIMA->LAD ok, VG->OM 100, VG->RPDA 100.  ? Enlarged thyroid   ? GERD (gastroesophageal reflux disease)   ? HLD (hyperlipidemia)   ? HTN (hypertension)   ? Moderate aortic stenosis   ? a. 03/2017 Echo: EF 60-65%, Gr2 DD, mod AS.  ? Tobacco abuse   ? ? ?Past Surgical History:  ?Procedure Laterality Date  ? BACK SURGERY    ? CORONARY ARTERY BYPASS GRAFT  2008  ? CORONARY BALLOON ANGIOPLASTY N/A 03/26/2017  ? Procedure: Coronary Balloon Angioplasty;  Surgeon: Cooper,  Michael, MD;  Location: [facility] INVASIVE CV LAB;  Service: Cardiovascular;  Laterality: N/A;  ? CORONARY STENT INTERVENTION N/A 03/26/2017  ? Procedure: Coronary Stent Intervention;  Surgeon: Cooper, Michael, MD;  Location: [facility] INVASIVE CV LAB;  Service: Cardiovascular;  Laterality: N/A;  ? RIGHT/LEFT HEART CATH AND CORONARY/GRAFT ANGIOGRAPHY N/A 03/26/2017  ? Procedure: Right/Left Heart Cath and Coronary/Graft Angiography;  Surgeon: Cooper, Michael, MD;  Location: [facility] INVASIVE CV LAB;  Service: Cardiovascular;  Laterality: N/A;  ? ? ?Current Outpatient Medications  ?Medication Sig Dispense Refill  ? albuterol (PROVENTIL HFA;VENTOLIN HFA) 108 (90 BASE) MCG/ACT inhaler Inhale 2 puffs into the lungs every 6 (six) hours as needed for wheezing or shortness of breath. 1 Inhaler 5  ? atorvastatin (LIPITOR) 80 MG tablet Take 80 mg by mouth daily.    ? Cholecalciferol (VITAMIN D3) 1000 units CAPS Take 1 capsule by mouth daily.     ? famotidine (PEPCID) 20 MG tablet Take by mouth.    ? fluticasone (FLONASE) 50 MCG/ACT nasal spray Place 1 spray into both nostrils as needed.    ? gabapentin (NEURONTIN) 300 MG capsule Take 300 mg by mouth daily.    ? lisinopril-hydrochlorothiazide (ZESTORETIC) 20-12.5 MG tablet Take 1 tablet by mouth daily.    ? naproxen (NAPROSYN) 500 MG tablet Take by mouth.    ? nitroGLYCERIN (NITROSTAT) 0.4 MG SL tablet Place 1   tablet (0.4 mg total) under the tongue every 5 (five) minutes as needed for chest pain. 90 tablet 3  ? omeprazole (PRILOSEC) 20 MG capsule Take 20 mg by mouth daily.    ? Vitamin D, Ergocalciferol, (DRISDOL) 1.25 MG (50000 UNIT) CAPS capsule Take 50,000 Units by mouth once a week.    ? ?No current facility-administered medications for this visit.  ? ? ?Allergies as of 01/11/2022  ? (No Known Allergies)  ? ? ?Family History  ?Problem Relation Age of Onset  ? Heart attack Mother   ? Alcohol abuse Father   ? CAD Brother   ? ? ?Social History  ? ?Socioeconomic History  ? Marital status: Divorced   ?  Spouse name: Not on file  ? Number of children: Not on file  ? Years of education: Not on file  ? Highest education level: Not on file  ?Occupational History  ? Not on file  ?Tobacco Use  ? Smoking status: Every Day  ?  Packs/day: 0.50  ?  Years: 45.00  ?  Pack years: 22.50  ?  Types: Cigarettes  ?  Last attempt to quit: 08/11/2016  ?  Years since quitting: 5.4  ? Smokeless tobacco: Never  ?Vaping Use  ? Vaping Use: Never used  ?Substance and Sexual Activity  ? Alcohol use: Not Currently  ?  Comment: occasionally  ? Drug use: No  ? Sexual activity: Never  ?Other Topics Concern  ? Not on file  ?Social History Narrative  ? Not on file  ? ?Social Determinants of Health  ? ?Financial Resource Strain: Not on file  ?Food Insecurity: Not on file  ?Transportation Needs: Not on file  ?Physical Activity: Not on file  ?Stress: Not on file  ?Social Connections: Not on file  ?Intimate Partner Violence: Not on file  ? ? ?Subjective: ?Review of Systems  ?Constitutional:  Negative for chills and fever.  ?HENT:  Negative for congestion and hearing loss.   ?Eyes:  Negative for blurred vision and double vision.  ?Respiratory:  Negative for cough and shortness of breath.   ?Cardiovascular:  Negative for chest pain and palpitations.  ?Gastrointestinal:  Positive for heartburn. Negative for abdominal pain, blood in stool, constipation, diarrhea, melena and vomiting.  ?     Dysphagia  ?Genitourinary:  Negative for dysuria and urgency.  ?Musculoskeletal:  Negative for joint pain and myalgias.  ?Skin:  Negative for itching and rash.  ?Neurological:  Negative for dizziness and headaches.  ?Psychiatric/Behavioral:  Negative for depression. The patient is not nervous/anxious.    ? ? ? ?Objective: ?BP (!) 139/95 (BP Location: Left Arm, Patient Position: Sitting, Cuff Size: Normal)   Pulse 83   Temp (!) 97.3 ?F (36.3 ?C) (Temporal)   Ht 5' 8" (1.727 m)   Wt 204 lb 6.4 oz (92.7 kg)   SpO2 99%   BMI 31.08 kg/m?  ?Physical  Exam ?Constitutional:   ?   Appearance: Normal appearance.  ?HENT:  ?   Head: Normocephalic and atraumatic.  ?Eyes:  ?   Extraocular Movements: Extraocular movements intact.  ?   Conjunctiva/sclera: Conjunctivae normal.  ?Cardiovascular:  ?   Rate and Rhythm: Normal rate and regular rhythm.  ?   Heart sounds: Murmur heard.  ?Pulmonary:  ?   Effort: Pulmonary effort is normal.  ?   Breath sounds: Normal breath sounds.  ?Abdominal:  ?   General: Bowel sounds are normal.  ?   Palpations: Abdomen is soft.  ?Musculoskeletal:     ?     General: Normal range of motion.  ?   Cervical back: Normal range of motion and neck supple.  ?Skin: ?   General: Skin is warm.  ?Neurological:  ?   General: No focal deficit present.  ?   Mental Status: He is alert and oriented to person, place, and time.  ?Psychiatric:     ?   Mood and Affect: Mood normal.     ?   Behavior: Behavior normal.  ? ? ? ?Assessment: ?*Esophageal dysphagia-new, worsening ?*Chronic GERD-well-controlled omeprazole daily ?*Colon cancer screening ?*High risk medication use ? ?Plan: ?Will schedule for EGD with possible dilation to evaluate for peptic ulcer disease, esophagitis, gastritis, H. Pylori, duodenitis, or other. Will also evaluate for esophageal stricture, Schatzki's ring, esophageal web or other.   ? ?The risks including infection, bleed, or perforation as well as benefits, limitations, alternatives and imponderables have been reviewed with the patient. Potential for esophageal dilation, biopsy, etc. have also been reviewed.  Questions have been answered. All parties agreeable. ? ?ASA 3 given cardiac issues. ? ?Patient will need to hold his clopidogrel x5 days prior to procedure. ? ?Recommended colonoscopy to be performed at the same time for colon cancer screening purposes.  Patient would like to hold off for now. ? ?Continue on omeprazole 20 mg daily for chronic reflux. ? ?Thank you Rachel Hyler for the kind referral. ? ?01/11/2022 2:02 PM ? ? ?Disclaimer:  This note was dictated with voice recognition software. Similar sounding words can inadvertently be transcribed and may not be corrected upon review. ? ?

## 2022-01-11 NOTE — Patient Instructions (Signed)
We will schedule you for upper endoscopy to further evaluate your difficulty swallowing. ? ?Continue on omeprazole daily for your chronic reflux. ? ?You will need to hold your Plavix (clopidogrel) x5 days prior to your procedure. ? ?You are due for colonoscopy though we will hold off for now. ? ?It was very nice meeting you today. ? ?Dr. Marletta Lor ?

## 2022-01-11 NOTE — Progress Notes (Addendum)
? ? ?Primary Care Physician:  Jani Gravel, MD ?Primary Gastroenterologist:  Dr. Abbey Chatters ? ?Chief Complaint  ?Patient presents with  ? Dysphagia  ?  Trouble swallowing, only able to eat foods like mashed potatoes. States that every time he swallows it feels like something is stuck in his throat.   ? ? ?HPI:   ?Craig Harmon is a 71 y.o. male who presents to the clinic today by referral from his PCP Blenda Nicely for evaluation.  Patient has chronic reflux which he states is well controlled on omeprazole 20 mg daily.  Notes for the past 2 to 3 months he has had progressively worsening esophageal dysphagia.  Primarily occurs with solids.  States he can eat soft foods such as mashed potatoes though meats will get stuck and he will regurgitate at times.  No nausea.  Significant smoking history.  States he had an EGD many years ago, unsure of findings. ? ?Takes Plavix for CAD.  Status post bypass many years ago.  Recent echo with aortic stenosis, low normal LVEF, right ventricular dysfunction. ? ?No melena hematochezia.  No unintentional weight loss.  No abdominal pain. ? ?Also due for screening colonoscopy.  States his last colonoscopy was many years ago.  No family history of colorectal malignancy. ? ?Past Medical History:  ?Diagnosis Date  ? Arthritis   ? CAD (coronary artery disease)   ? a. s/p prior CABG x 3 9LIMA->LAD, VG->RPDA, VG->OM;  b. s/p prior stenting of OM1;  c. 03/2017 Cath/PCI: LAD 100, LCX 90p (3.5x20 Synergy DES), OM1 90 ISR (CBA), RCA 100p, LIMA->LAD ok, VG->OM 100, VG->RPDA 100.  ? Enlarged thyroid   ? GERD (gastroesophageal reflux disease)   ? HLD (hyperlipidemia)   ? HTN (hypertension)   ? Moderate aortic stenosis   ? a. 03/2017 Echo: EF 60-65%, Gr2 DD, mod AS.  ? Tobacco abuse   ? ? ?Past Surgical History:  ?Procedure Laterality Date  ? BACK SURGERY    ? CORONARY ARTERY BYPASS GRAFT  2008  ? CORONARY BALLOON ANGIOPLASTY N/A 03/26/2017  ? Procedure: Coronary Balloon Angioplasty;  Surgeon: Sherren Mocha, MD;  Location: [facility];  Service: Cardiovascular;  Laterality: N/A;  ? CORONARY STENT INTERVENTION N/A 03/26/2017  ? Procedure: Coronary Stent Intervention;  Surgeon: Sherren Mocha, MD;  Location: [facility];  Service: Cardiovascular;  Laterality: N/A;  ? RIGHT/LEFT HEART CATH AND CORONARY/GRAFT ANGIOGRAPHY N/A 03/26/2017  ? Procedure: Right/Left Heart Cath and Coronary/Graft Angiography;  Surgeon: Sherren Mocha, MD;  Location: [facility];  Service: Cardiovascular;  Laterality: N/A;  ? ? ?Current Outpatient Medications  ?Medication Sig Dispense Refill  ? albuterol (PROVENTIL HFA;VENTOLIN HFA) 108 (90 BASE) MCG/ACT inhaler Inhale 2 puffs into the lungs every 6 (six) hours as needed for wheezing or shortness of breath. 1 Inhaler 5  ? atorvastatin (LIPITOR) 80 MG tablet Take 80 mg by mouth daily.    ? Cholecalciferol (VITAMIN D3) 1000 units CAPS Take 1 capsule by mouth daily.     ? famotidine (PEPCID) 20 MG tablet Take by mouth.    ? fluticasone (FLONASE) 50 MCG/ACT nasal spray Place 1 spray into both nostrils as needed.    ? gabapentin (NEURONTIN) 300 MG capsule Take 300 mg by mouth daily.    ? lisinopril-hydrochlorothiazide (ZESTORETIC) 20-12.5 MG tablet Take 1 tablet by mouth daily.    ? naproxen (NAPROSYN) 500 MG tablet Take by mouth.    ? nitroGLYCERIN (NITROSTAT) 0.4 MG SL tablet Place 1  tablet (0.4 mg total) under the tongue every 5 (five) minutes as needed for chest pain. 90 tablet 3  ? omeprazole (PRILOSEC) 20 MG capsule Take 20 mg by mouth daily.    ? Vitamin D, Ergocalciferol, (DRISDOL) 1.25 MG (50000 UNIT) CAPS capsule Take 50,000 Units by mouth once a week.    ? ?No current facility-administered medications for this visit.  ? ? ?Allergies as of 01/11/2022  ? (No Known Allergies)  ? ? ?Family History  ?Problem Relation Age of Onset  ? Heart attack Mother   ? Alcohol abuse Father   ? CAD Brother   ? ? ?Social History  ? ?Socioeconomic History  ? Marital status: Divorced   ?  Spouse name: Not on file  ? Number of children: Not on file  ? Years of education: Not on file  ? Highest education level: Not on file  ?Occupational History  ? Not on file  ?Tobacco Use  ? Smoking status: Every Day  ?  Packs/day: 0.50  ?  Years: 45.00  ?  Pack years: 22.50  ?  Types: Cigarettes  ?  Last attempt to quit: 08/11/2016  ?  Years since quitting: 5.4  ? Smokeless tobacco: Never  ?Vaping Use  ? Vaping Use: Never used  ?Substance and Sexual Activity  ? Alcohol use: Not Currently  ?  Comment: occasionally  ? Drug use: No  ? Sexual activity: Never  ?Other Topics Concern  ? Not on file  ?Social History Narrative  ? Not on file  ? ?Social Determinants of Health  ? ?Financial Resource Strain: Not on file  ?Food Insecurity: Not on file  ?Transportation Needs: Not on file  ?Physical Activity: Not on file  ?Stress: Not on file  ?Social Connections: Not on file  ?Intimate Partner Violence: Not on file  ? ? ?Subjective: ?Review of Systems  ?Constitutional:  Negative for chills and fever.  ?HENT:  Negative for congestion and hearing loss.   ?Eyes:  Negative for blurred vision and double vision.  ?Respiratory:  Negative for cough and shortness of breath.   ?Cardiovascular:  Negative for chest pain and palpitations.  ?Gastrointestinal:  Positive for heartburn. Negative for abdominal pain, blood in stool, constipation, diarrhea, melena and vomiting.  ?     Dysphagia  ?Genitourinary:  Negative for dysuria and urgency.  ?Musculoskeletal:  Negative for joint pain and myalgias.  ?Skin:  Negative for itching and rash.  ?Neurological:  Negative for dizziness and headaches.  ?Psychiatric/Behavioral:  Negative for depression. The patient is not nervous/anxious.    ? ? ? ?Objective: ?BP (!) 139/95 (BP Location: Left Arm, Patient Position: Sitting, Cuff Size: Normal)   Pulse 83   Temp (!) 97.3 ?F (36.3 ?C) (Temporal)   Ht 5\' 8"  (1.727 m)   Wt 204 lb 6.4 oz (92.7 kg)   SpO2 99%   BMI 31.08 kg/m?  ?Physical  Exam ?Constitutional:   ?   Appearance: Normal appearance.  ?HENT:  ?   Head: Normocephalic and atraumatic.  ?Eyes:  ?   Extraocular Movements: Extraocular movements intact.  ?   Conjunctiva/sclera: Conjunctivae normal.  ?Cardiovascular:  ?   Rate and Rhythm: Normal rate and regular rhythm.  ?   Heart sounds: Murmur heard.  ?Pulmonary:  ?   Effort: Pulmonary effort is normal.  ?   Breath sounds: Normal breath sounds.  ?Abdominal:  ?   General: Bowel sounds are normal.  ?   Palpations: Abdomen is soft.  ?Musculoskeletal:     ?  General: Normal range of motion.  ?   Cervical back: Normal range of motion and neck supple.  ?Skin: ?   General: Skin is warm.  ?Neurological:  ?   General: No focal deficit present.  ?   Mental Status: He is alert and oriented to person, place, and time.  ?Psychiatric:     ?   Mood and Affect: Mood normal.     ?   Behavior: Behavior normal.  ? ? ? ?Assessment: ?*Esophageal dysphagia-new, worsening ?*Chronic GERD-well-controlled omeprazole daily ?*Colon cancer screening ?*High risk medication use ? ?Plan: ?Will schedule for EGD with possible dilation to evaluate for peptic ulcer disease, esophagitis, gastritis, H. Pylori, duodenitis, or other. Will also evaluate for esophageal stricture, Schatzki's ring, esophageal web or other.   ? ?The risks including infection, bleed, or perforation as well as benefits, limitations, alternatives and imponderables have been reviewed with the patient. Potential for esophageal dilation, biopsy, etc. have also been reviewed.  Questions have been answered. All parties agreeable. ? ?ASA 3 given cardiac issues. ? ?Patient will need to hold his clopidogrel x5 days prior to procedure. ? ?Recommended colonoscopy to be performed at the same time for colon cancer screening purposes.  Patient would like to hold off for now. ? ?Continue on omeprazole 20 mg daily for chronic reflux. ? ?Thank you Blenda Nicely for the kind referral. ? ?01/11/2022 2:02 PM ? ? ?Disclaimer:  This note was dictated with voice recognition software. Similar sounding words can inadvertently be transcribed and may not be corrected upon review. ? ?

## 2022-01-11 NOTE — Telephone Encounter (Signed)
PA approved via [facility] Auth# [id number], DOS: Feb 06, 2022 - May 07, 2022 ?

## 2022-02-01 NOTE — Patient Instructions (Signed)
Lenell AntuRichard W Ackerman  02/01/2022     @PREFPERIOPPHARMACY @   Your procedure is scheduled on  02/06/2022.   [facility]  [phone] A.M.   Call this number if you have problems the morning of surgery:  [phone]   Remember:  Follow the diet instructions given to you by the office.    Your last dose of plavix should have been on 01/31/2022.     Take these medicines the morning of surgery with A SIP OF WATER                           pepcid, gabapentin, prilosec.     Do not wear jewelry, make-up or nail polish.  Do not wear lotions, powders, or perfumes, or deodorant.  Do not shave 48 hours prior to surgery.  Men may shave face and neck.  Do not bring valuables to the hospital.  [facility] belongings or [facility]uables.  Contacts, dentures or bridgework may not be worn into surgery.  Leave your suitcase in the car.  After surgery it may be brought to your room.  For patients admitted to the hospital, discharge time will be determined by your treatment team.  Patients discharged the day of surgery will not be allowed to drive home and must have someone with them for 24 hours.    Special instructions:   DO NOT smoke tobacco or vape for 24 hours before your procedure.  Please read over the following fact sheets that you were given. Anesthesia Post-op Instructions and Care and Recovery After Surgery      Upper Endoscopy, Adult, Care After This sheet gives you information about how to care for yourself after your procedure. Your health care provider may also give you more specific instructions. If you have problems or questions, contact your health care provider. What can I expect after the procedure? After the procedure, it is common to have: A sore throat. Mild stomach pain or discomfort. Bloating. Nausea. Follow these instructions at home:  Follow instructions from your health care provider about what to eat or drink after your  procedure. Return to your normal activities as told by your health care provider. Ask your health care provider what activities are safe for you. Take over-the-counter and prescription medicines only as told by your health care provider. If you were given a sedative during the procedure, it can affect you for several hours. Do not drive or operate machinery until your health care provider says that it is safe. Keep all follow-up visits as told by your health care provider. This is important. Contact a health care provider if you have: A sore throat that lasts longer than one day. Trouble swallowing. Get help right away if: You vomit blood or your vomit looks like coffee grounds. You have: A fever. Bloody, black, or tarry stools. A severe sore throat or you cannot swallow. Difficulty breathing. Severe pain in your chest or abdomen. Summary After the procedure, it is common to have a sore throat, mild stomach discomfort, bloating, and nausea. If you were given a sedative during the procedure, it can affect you for several hours. Do not drive or operate machinery until your health care provider says that it is safe. Follow instructions from your health care provider about what to eat or drink after your procedure. Return to your normal activities as told by your health care  provider. This information is not intended to replace advice given to you by your health care provider. Make sure you discuss any questions you have with your health care provider. Document Revised: 06/27/2019 Document Reviewed: 01/21/2018 Elsevier Patient Education  2023 [employer] Esophageal Dilatation Esophageal dilatation, also called esophageal dilation, is a procedure to widen or open a blocked or narrowed part of the esophagus. The esophagus is the part of the body that moves food and liquid from the mouth to the stomach. You may need this procedure if: You have a buildup of scar tissue in your esophagus that  makes it difficult, painful, or impossible to swallow. This can be caused by gastroesophageal reflux disease (GERD). You have cancer of the esophagus. There is a problem with how food moves through your esophagus. In some cases, you may need this procedure repeated at a later time to dilate the esophagus gradually. Tell a health care provider about: Any allergies you have. All medicines you are taking, including vitamins, herbs, eye drops, creams, and over-the-counter medicines. Any problems you or family members have had with anesthetic medicines. Any blood disorders you have. Any surgeries you have had. Any medical conditions you have. Any antibiotic medicines you are required to take before dental procedures. Whether you are pregnant or may be pregnant. What are the risks? Generally, this is a safe procedure. However, problems may occur, including: Bleeding due to a tear in the lining of the esophagus. A hole, or perforation, in the esophagus. What happens before the procedure? Ask your health care provider about: Changing or stopping your regular medicines. This is especially important if you are taking diabetes medicines or blood thinners. Taking medicines such as aspirin and ibuprofen. These medicines can thin your blood. Do not take these medicines unless your health care provider tells you to take them. Taking over-the-counter medicines, vitamins, herbs, and supplements. Follow instructions from your health care provider about eating or drinking restrictions. Plan to have a responsible adult take you home from the hospital or clinic. Plan to have a responsible adult care for you for the time you are told after you leave the hospital or clinic. This is important. What happens during the procedure? You may be given a medicine to help you relax (sedative). A numbing medicine may be sprayed into the back of your throat, or you may gargle the medicine. Your health care provider may  perform the dilatation using various surgical instruments, such as: Simple dilators. This instrument is carefully placed in the esophagus to stretch it. Guided wire bougies. This involves using an endoscope to insert a wire into the esophagus. A dilator is passed over this wire to enlarge the esophagus. Then the wire is removed. Balloon dilators. An endoscope with a small balloon is inserted into the esophagus. The balloon is inflated to stretch the esophagus and open it up. The procedure may vary among health care providers and hospitals. What can I expect after the procedure? Your blood pressure, heart rate, breathing rate, and blood oxygen level will be monitored until you leave the hospital or clinic. Your throat may feel slightly sore and numb. This will get better over time. You will not be allowed to eat or drink until your throat is no longer numb. When you are able to drink, urinate, and sit on the edge of the bed without nausea or dizziness, you may be able to return home. Follow these instructions at home: Take over-the-counter and prescription medicines only as told by your  health care provider. If you were given a sedative during the procedure, it can affect you for several hours. Do not drive or operate machinery until your health care provider says that it is safe. Plan to have a responsible adult care for you for the time you are told. This is important. Follow instructions from your health care provider about any eating or drinking restrictions. Do not use any products that contain nicotine or tobacco, such as cigarettes, e-cigarettes, and chewing tobacco. If you need help quitting, ask your health care provider. Keep all follow-up visits. This is important. Contact a health care provider if: You have a fever. You have pain that is not relieved by medicine. Get help right away if: You have chest pain. You have trouble breathing. You have trouble swallowing. You vomit  blood. You have black, tarry, or bloody stools. These symptoms may represent a serious problem that is an emergency. Do not wait to see if the symptoms will go away. Get medical help right away. Call your local emergency services (911 in the [place] Do not drive yourself to the hospital. Summary Esophageal dilatation, also called esophageal dilation, is a procedure to widen or open a blocked or narrowed part of the esophagus. Plan to have a responsible adult take you home from the hospital or clinic. For this procedure, a numbing medicine may be sprayed into the back of your throat, or you may gargle the medicine. Do not drive or operate machinery until your health care provider says that it is safe. This information is not intended to replace advice given to you by your health care provider. Make sure you discuss any questions you have with your health care provider. Document Revised: 01/07/2020 Document Reviewed: 01/07/2020 Elsevier Patient Education  2023 Else[employer] Inc. Monitored Anesthesia Care, Care After This sheet gives you information about how to care for yourself after your procedure. Your health care provider may also give you more specific instructions. If you have problems or questions, contact your health care provider. What can I expect after the procedure? After the procedure, it is common to have: Tiredness. Forgetfulness about what happened after the procedure. Impaired judgment for important decisions. Nausea or vomiting. Some difficulty with balance. Follow these instructions at home: For the time period you were told by your health care provider:     Rest as needed. Do not participate in activities where you could fall or become injured. Do not drive or use machinery. Do not drink alcohol. Do not take sleeping pills or medicines that cause drowsiness. Do not make important decisions or sign legal documents. Do not take care of children on your own. Eating and  drinking Follow the diet that is recommended by your health care provider. Drink enough fluid to keep your urine pale yellow. If you vomit: Drink water, juice, or soup when you can drink without vomiting. Make sure you have little or no nausea before eating solid foods. General instructions Have a responsible adult stay with you for the time you are told. It is important to have someone help care for you until you are awake and alert. Take over-the-counter and prescription medicines only as told by your health care provider. If you have sleep apnea, surgery and certain medicines can increase your risk for breathing problems. Follow instructions from your health care provider about wearing your sleep device: Anytime you are sleeping, including during daytime naps. While taking prescription pain medicines, sleeping medicines, or medicines that make you drowsy. Avoid smoking.  Keep all follow-up visits as told by your health care provider. This is important. Contact a health care provider if: You keep feeling nauseous or you keep vomiting. You feel light-headed. You are still sleepy or having trouble with balance after 24 hours. You develop a rash. You have a fever. You have redness or swelling around the IV site. Get help right away if: You have trouble breathing. You have new-onset confusion at home. Summary For several hours after your procedure, you may feel tired. You may also be forgetful and have poor judgment. Have a responsible adult stay with you for the time you are told. It is important to have someone help care for you until you are awake and alert. Rest as told. Do not drive or operate machinery. Do not drink alcohol or take sleeping pills. Get help right away if you have trouble breathing, or if you suddenly become confused. This information is not intended to replace advice given to you by your health care provider. Make sure you discuss any questions you have with your  health care provider. Document Revised: 07/26/2021 Document Reviewed: 07/24/2019 Elsevier Patient Education  2023 [employer].

## 2022-02-03 ENCOUNTER — Encounter (HOSPITAL_COMMUNITY)
Admission: RE | Admit: 2022-02-03 | Discharge: 2022-02-03 | Disposition: A | Discharge status: Home / Self Care | Payer: Medicare Other | Source: Ambulatory Visit | Attending: Internal Medicine | Admitting: Internal Medicine

## 2022-02-03 VITALS — BP 107/81 | HR 90 | Temp 97.8°F | Resp 18 | Ht 68.0 in | Wt 180.0 lb

## 2022-02-03 DIAGNOSIS — F17200 Nicotine dependence, unspecified, uncomplicated: Secondary | ICD-10-CM | POA: Insufficient documentation

## 2022-02-03 DIAGNOSIS — Z79899 Other long term (current) drug therapy: Secondary | ICD-10-CM | POA: Insufficient documentation

## 2022-02-03 DIAGNOSIS — Z720 Tobacco use: Secondary | ICD-10-CM

## 2022-02-03 DIAGNOSIS — I10 Essential (primary) hypertension: Secondary | ICD-10-CM | POA: Insufficient documentation

## 2022-02-03 DIAGNOSIS — Z01818 Encounter for other preprocedural examination: Secondary | ICD-10-CM | POA: Insufficient documentation

## 2022-02-03 LAB — BASIC METABOLIC PANEL
Anion gap: 7 (ref 5–15)
BUN: 11 mg/dL (ref 8–23)
CO2: 24 mmol/L (ref 22–32)
Calcium: 9.1 mg/dL (ref 8.9–10.3)
Chloride: 106 mmol/L (ref 98–111)
Creatinine, Ser: 0.85 mg/dL (ref 0.61–1.24)
GFR, Estimated: >60 mL/min (ref >60)
Glucose, Bld: 137 mg/dL — ABNORMAL HIGH (ref 70–99)
Potassium: 3.5 mmol/L (ref 3.5–5.1)
Sodium: 137 mmol/L (ref 135–145)

## 2022-02-06 ENCOUNTER — Ambulatory Visit (HOSPITAL_COMMUNITY)
Admission: RE | Admit: 2022-02-06 | Discharge: 2022-02-06 | Disposition: A | Discharge status: Home / Self Care | Payer: Medicare Other | Attending: Internal Medicine | Admitting: Internal Medicine

## 2022-02-06 ENCOUNTER — Encounter (HOSPITAL_COMMUNITY)
Admission: RE | Disposition: A | Discharge status: Home / Self Care | Payer: Self-pay | Source: Home / Self Care | Attending: Internal Medicine

## 2022-02-06 ENCOUNTER — Ambulatory Visit (HOSPITAL_BASED_OUTPATIENT_CLINIC_OR_DEPARTMENT_OTHER): Payer: Medicare Other | Admitting: Anesthesiology

## 2022-02-06 ENCOUNTER — Ambulatory Visit (HOSPITAL_COMMUNITY): Payer: Medicare Other | Admitting: Anesthesiology

## 2022-02-06 ENCOUNTER — Encounter (HOSPITAL_COMMUNITY): Payer: Self-pay | Admitting: *Deleted

## 2022-02-06 DIAGNOSIS — Z951 Presence of aortocoronary bypass graft: Secondary | ICD-10-CM | POA: Diagnosis not present

## 2022-02-06 DIAGNOSIS — K2950 Unspecified chronic gastritis without bleeding: Secondary | ICD-10-CM | POA: Insufficient documentation

## 2022-02-06 DIAGNOSIS — K219 Gastro-esophageal reflux disease without esophagitis: Secondary | ICD-10-CM | POA: Diagnosis not present

## 2022-02-06 DIAGNOSIS — F17210 Nicotine dependence, cigarettes, uncomplicated: Secondary | ICD-10-CM | POA: Insufficient documentation

## 2022-02-06 DIAGNOSIS — R1310 Dysphagia, unspecified: Secondary | ICD-10-CM

## 2022-02-06 DIAGNOSIS — I350 Nonrheumatic aortic (valve) stenosis: Secondary | ICD-10-CM | POA: Insufficient documentation

## 2022-02-06 DIAGNOSIS — Z7902 Long term (current) use of antithrombotics/antiplatelets: Secondary | ICD-10-CM | POA: Diagnosis not present

## 2022-02-06 DIAGNOSIS — K259 Gastric ulcer, unspecified as acute or chronic, without hemorrhage or perforation: Secondary | ICD-10-CM

## 2022-02-06 DIAGNOSIS — J449 Chronic obstructive pulmonary disease, unspecified: Secondary | ICD-10-CM | POA: Diagnosis not present

## 2022-02-06 DIAGNOSIS — R1314 Dysphagia, pharyngoesophageal phase: Secondary | ICD-10-CM | POA: Diagnosis present

## 2022-02-06 DIAGNOSIS — K2970 Gastritis, unspecified, without bleeding: Secondary | ICD-10-CM

## 2022-02-06 DIAGNOSIS — I2510 Atherosclerotic heart disease of native coronary artery without angina pectoris: Secondary | ICD-10-CM | POA: Diagnosis not present

## 2022-02-06 DIAGNOSIS — K449 Diaphragmatic hernia without obstruction or gangrene: Secondary | ICD-10-CM | POA: Insufficient documentation

## 2022-02-06 DIAGNOSIS — K222 Esophageal obstruction: Secondary | ICD-10-CM | POA: Diagnosis not present

## 2022-02-06 DIAGNOSIS — Z955 Presence of coronary angioplasty implant and graft: Secondary | ICD-10-CM | POA: Diagnosis not present

## 2022-02-06 HISTORY — PX: BALLOON DILATION: SHX5330

## 2022-02-06 HISTORY — PX: ESOPHAGOGASTRODUODENOSCOPY (EGD) WITH PROPOFOL: SHX5813

## 2022-02-06 HISTORY — PX: BIOPSY: SHX5522

## 2022-02-06 LAB — GLUCOSE, CAPILLARY: Glucose-Capillary: 109 mg/dL — ABNORMAL HIGH (ref 70–99)

## 2022-02-06 SURGERY — ESOPHAGOGASTRODUODENOSCOPY (EGD) WITH PROPOFOL
Anesthesia: General

## 2022-02-06 MED ORDER — PROPOFOL 10 MG/ML IV BOLUS
INTRAVENOUS | Status: DC | PRN
  Administered 2022-02-06 (×2): 50 mg via INTRAVENOUS
  Administered 2022-02-06: 100 mg via INTRAVENOUS

## 2022-02-06 MED ORDER — LIDOCAINE HCL (CARDIAC) PF 100 MG/5ML IV SOSY
PREFILLED_SYRINGE | INTRAVENOUS | Status: DC | PRN
  Administered 2022-02-06: 60 mg via INTRAVENOUS

## 2022-02-06 MED ORDER — OMEPRAZOLE 40 MG PO CPDR: 40.0000 mg | DELAYED_RELEASE_CAPSULE | Freq: Two times a day (BID) | ORAL | 5 refills | Status: DC

## 2022-02-06 MED ORDER — LACTATED RINGERS IV SOLN: INTRAVENOUS | Status: DC

## 2022-02-06 NOTE — Op Note (Signed)
[facility] Patient Name: Craig Harmon Procedure Date: 02/06/2022 8:18 AM MRN: [id number] Date of Birth: Aug 24, 1951 Attending MD: Elon Alas. Abbey Chatters DO CSN: [id number] Age: 71 Admit Type: Outpatient Procedure:                Upper GI endoscopy Indications:              Dysphagia, Heartburn Providers:                Elon Alas. Abbey Chatters, DO, Caprice Kluver, Rosina Lowenstein, RN Referring MD:              Medicines:                See the Anesthesia note for documentation of the                            administered medications Complications:            No immediate complications. Estimated Blood Loss:     Estimated blood loss was minimal. Procedure:                Pre-Anesthesia Assessment:                           - The anesthesia plan was to use monitored                            anesthesia care (MAC).                           After obtaining informed consent, the endoscope was                            passed under direct vision. Throughout the                            procedure, the patient's blood pressure, pulse, and                            oxygen saturations were monitored continuously. The                            GIF-H190 ([id number]) scope was introduced through the                            mouth, and advanced to the second part of duodenum.                            The upper GI endoscopy was accomplished without                            difficulty. The patient tolerated the procedure                            well. Scope In: 8:29:38 AM Scope Out: 8:36:12 AM Total Procedure Duration: 0 hours 6 minutes 34 seconds  Findings:      A large hiatal hernia with multiple Cameron ulcers was  found.      A mild Schatzki ring was found in the distal esophagus. A TTS dilator       was passed through the scope. Dilation with an 18-19-20 mm balloon       dilator was performed to 20 mm. The dilation site was examined and       showed moderate improvement in luminal  narrowing.      Diffuse moderate inflammation characterized by erosions and erythema was       found in the entire examined stomach. Biopsies were taken with a cold       forceps for Helicobacter pylori testing.      Three non-bleeding cratered gastric ulcers with no stigmata of bleeding       were found in the gastric antrum. The largest lesion was 6 mm in largest       dimension.      The duodenal bulb, first portion of the duodenum and second portion of       the duodenum were normal. Impression:               - Large hiatal hernia with multiple Cameron ulcers.                           - Mild Schatzki ring. Dilated.                           - Gastritis. Biopsied.                           - Non-bleeding gastric ulcers with no stigmata of                            bleeding.                           - Normal duodenal bulb, first portion of the                            duodenum and second portion of the duodenum. Moderate Sedation:      Per Anesthesia Care Recommendation:           - Patient has a contact number available for                            emergencies. The signs and symptoms of potential                            delayed complications were discussed with the                            patient. Return to normal activities tomorrow.                            Written discharge instructions were provided to the                            patient.                           -  Resume previous diet.                           - Continue present medications.                           - Await pathology results.                           - Use a proton pump inhibitor PO BID.                           - No ibuprofen, naproxen, or other non-steroidal                            anti-inflammatory drugs.                           - Return to GI clinic in 4 months. Procedure Code(s):        --- Professional ---                           [phone], Esophagogastroduodenoscopy, flexible,                             transoral; with transendoscopic balloon dilation of                            esophagus (less than 30 mm diameter)                           43239, 59, Esophagogastroduodenoscopy, flexible,                            transoral; with biopsy, single or multiple Diagnosis Code(s):        --- Professional ---                           K44.9, Diaphragmatic hernia without obstruction or                            gangrene                           K25.9, Gastric ulcer, unspecified as acute or                            chronic, without hemorrhage or perforation                           K22.2, Esophageal obstruction                           K29.70, Gastritis, unspecified, without bleeding                           R13.10, Dysphagia, unspecified  R12, Heartburn CPT copyright 2019 [employer]. All rights reserved. The codes documented in this report are preliminary and upon coder review may  be revised to meet current compliance requirements. Elon Alas. Abbey Chatters, DO [place] Abbey Chatters, DO 02/06/2022 8:40:31 AM This report has been signed electronically. Number of Addenda: 0

## 2022-02-06 NOTE — Discharge Instructions (Addendum)
EGD Discharge instructions Please read the instructions outlined below and refer to this sheet in the next few weeks. These discharge instructions provide you with general information on caring for yourself after you leave the hospital. Your doctor may also give you specific instructions. While your treatment has been planned according to the most current medical practices available, unavoidable complications occasionally occur. If you have any problems or questions after discharge, please call your doctor. ACTIVITY You may resume your regular activity but move at a slower pace for the next 24 hours.  Take frequent rest periods for the next 24 hours.  Walking will help expel (get rid of) the air and reduce the bloated feeling in your abdomen.  No driving for 24 hours (because of the anesthesia (medicine) used during the test).  You may shower.  Do not sign any important legal documents or operate any machinery for 24 hours (because of the anesthesia used during the test).  NUTRITION Drink plenty of fluids.  You may resume your normal diet.  Begin with a light meal and progress to your normal diet.  Avoid alcoholic beverages for 24 hours or as instructed by your caregiver.  MEDICATIONS You may resume your normal medications unless your caregiver tells you otherwise.  WHAT YOU CAN EXPECT TODAY You may experience abdominal discomfort such as a feeling of fullness or "gas" pains.  FOLLOW-UP Your doctor will discuss the results of your test with you.  SEEK IMMEDIATE MEDICAL ATTENTION IF ANY OF THE FOLLOWING OCCUR: Excessive nausea (feeling sick to your stomach) and/or vomiting.  Severe abdominal pain and distention (swelling).  Trouble swallowing.  Temperature over 101 F (37.8 C).  Rectal bleeding or vomiting of blood.    Your EGD revealed a significant amount of inflammation in your stomach.  You have a large hiatal hernia.  You have multiple ulcers in your stomach.  I took biopsies to  rule out infection with bacteria called H. pylori.  Await pathology results, my office will contact you.  You did have a tightening of your esophagus which I stretched today.  Hopefully this helps with your swallowing.  I am going to increase your omeprazole to 40 mg twice daily.  Avoid NSAIDs.  Follow-up with GI in 4 months.  I hope you have a great rest of your week!  Hennie Duos. Marletta Lor, D.O. Gastroenterology and Hepatology [facility]

## 2022-02-06 NOTE — Interval H&P Note (Signed)
History and Physical Interval Note:  02/06/2022 8:17 AM  Craig Harmon  has presented today for surgery, with the diagnosis of gerd, dysphagia.  The various methods of treatment have been discussed with the patient and family. After consideration of risks, benefits and other options for treatment, the patient has consented to  Procedure(s) with comments: ESOPHAGOGASTRODUODENOSCOPY (EGD) WITH PROPOFOL (N/A) - 8:15am BALLOON DILATION (N/A) as a surgical intervention.  The patient's history has been reviewed, patient examined, no change in status, stable for surgery.  I have reviewed the patient's chart and labs.  Questions were answered to the patient's satisfaction.     [name]

## 2022-02-06 NOTE — Transfer of Care (Signed)
Immediate Anesthesia Transfer of Care Note  Patient: Craig Harmon  Procedure(s) Performed: ESOPHAGOGASTRODUODENOSCOPY (EGD) WITH PROPOFOL BALLOON DILATION BIOPSY  Patient Location: Short Stay  Anesthesia Type:General  Level of Consciousness: drowsy  Airway & Oxygen Therapy: Patient Spontanous Breathing  Post-op Assessment: Report given to RN and Post -op Vital signs reviewed and stable  Post vital signs: Reviewed and stable  Last Vitals:  Vitals Value Taken Time  BP    Temp    Pulse    Resp    SpO2      Last Pain:  Vitals:   02/06/22 0824  TempSrc:   PainSc: 0-No pain      Patients Stated Pain Goal: 5 ([id number])  Complications: No notable events documented.

## 2022-02-06 NOTE — Anesthesia Preprocedure Evaluation (Signed)
Anesthesia Evaluation  Patient identified by MRN, date of birth, ID band Patient awake    Reviewed: Allergy & Precautions, H&P , NPO status , Patient's Chart, lab work & pertinent test results, reviewed documented beta blocker date and time   Airway Mallampati: II  TM Distance: >3 FB Neck ROM: full    Dental no notable dental hx.    Pulmonary COPD, Current Smoker,    Pulmonary exam normal breath sounds clear to auscultation       Cardiovascular Exercise Tolerance: Good hypertension, + CAD, + Cardiac Stents and + CABG   Rhythm:regular Rate:Normal     Neuro/Psych negative neurological ROS  negative psych ROS   GI/Hepatic Neg liver ROS, GERD  Medicated,  Endo/Other  negative endocrine ROS  Renal/GU negative Renal ROS  negative genitourinary   Musculoskeletal   Abdominal   Peds  Hematology negative hematology ROS (+)   Anesthesia Other Findings   Reproductive/Obstetrics negative OB ROS                             Anesthesia Physical Anesthesia Plan  ASA: 3  Anesthesia Plan: General   Post-op Pain Management:    Induction:   PONV Risk Score and Plan: Propofol infusion  Airway Management Planned:   Additional Equipment:   Intra-op Plan:   Post-operative Plan:   Informed Consent: I have reviewed the patients History and Physical, chart, labs and discussed the procedure including the risks, benefits and alternatives for the proposed anesthesia with the patient or authorized representative who has indicated his/her understanding and acceptance.     Dental Advisory Given  Plan Discussed with: CRNA  Anesthesia Plan Comments:         Anesthesia Quick Evaluation

## 2022-02-07 NOTE — Anesthesia Postprocedure Evaluation (Signed)
Anesthesia Post Note  Patient: Craig Harmon  Procedure(s) Performed: ESOPHAGOGASTRODUODENOSCOPY (EGD) WITH PROPOFOL BALLOON DILATION BIOPSY  Patient location during evaluation: Phase II Anesthesia Type: General Level of consciousness: awake Pain management: pain level controlled Vital Signs Assessment: post-procedure vital signs reviewed and stable Respiratory status: spontaneous breathing and respiratory function stable Cardiovascular status: blood pressure returned to baseline and stable Postop Assessment: no headache and no apparent nausea or vomiting Anesthetic complications: no Comments: Late entry   No notable events documented.   Last Vitals:  Vitals:   02/06/22 0838 02/06/22 0844  BP: (!) 98/50   Pulse: 72 74  Resp: 17 18  Temp: 36.4 C   SpO2: 96%     Last Pain:  Vitals:   02/06/22 0844  TempSrc:   PainSc: 0-No pain                 Louann Sjogren

## 2022-02-08 LAB — SURGICAL PATHOLOGY

## 2022-02-14 ENCOUNTER — Encounter (HOSPITAL_COMMUNITY): Payer: Self-pay | Admitting: Internal Medicine

## 2022-02-14 ENCOUNTER — Ambulatory Visit: Payer: Medicare Other | Admitting: Gastroenterology

## 2022-06-08 ENCOUNTER — Ambulatory Visit: Payer: Medicare Other | Admitting: Gastroenterology

## 2022-07-29 ENCOUNTER — Other Ambulatory Visit: Payer: Self-pay

## 2022-07-29 ENCOUNTER — Emergency Department (HOSPITAL_COMMUNITY)
Admission: EM | Admit: 2022-07-29 | Discharge: 2022-07-29 | Disposition: A | Discharge status: Home / Self Care | Payer: Medicare Other | Attending: Emergency Medicine | Admitting: Emergency Medicine

## 2022-07-29 ENCOUNTER — Encounter (HOSPITAL_COMMUNITY): Payer: Self-pay | Admitting: Emergency Medicine

## 2022-07-29 DIAGNOSIS — E119 Type 2 diabetes mellitus without complications: Secondary | ICD-10-CM | POA: Diagnosis not present

## 2022-07-29 DIAGNOSIS — F17210 Nicotine dependence, cigarettes, uncomplicated: Secondary | ICD-10-CM | POA: Diagnosis not present

## 2022-07-29 DIAGNOSIS — M5412 Radiculopathy, cervical region: Secondary | ICD-10-CM | POA: Diagnosis not present

## 2022-07-29 DIAGNOSIS — M542 Cervicalgia: Secondary | ICD-10-CM | POA: Diagnosis present

## 2022-07-29 MED ORDER — PREDNISONE 20 MG PO TABS: 40.0000 mg | ORAL_TABLET | Freq: Every day | ORAL | 0 refills | Status: DC

## 2022-07-29 NOTE — ED Provider Notes (Signed)
[facility] EMERGENCY DEPARTMENT Provider Note   CSN: [id number] Arrival date & time: 07/29/22  1344     History  Chief Complaint  Patient presents with   Wound Check    Craig Harmon is a 71 y.o. male.   Wound Check   This patient is a 71 year old male, he is not a diabetic, he does smoke cigarettes and has high cholesterol.  He reports that over the last several weeks he has had a burning sensation in his right shoulder and neck going down his right arm which causes some numbness and tingling, this morning he felt like he might of dropped his coffee cup when his arm was hurting, that is gone away and he is feeling better at this time.  He is trying to correlate this with a possible insect bite on the back of his right shoulder where he has a scab but does not remember having an insect bite there, he has been feeling this lump and felt like it might be a tick, it is not.    Home Medications Prior to Admission medications   Medication Sig Start Date End Date Taking? Authorizing Provider  predniSONE (DELTASONE) 20 MG tablet Take 2 tablets (40 mg total) by mouth daily. 07/29/22  Yes Eber Hong, MD  albuterol (PROVENTIL HFA;VENTOLIN HFA) 108 (90 BASE) MCG/ACT inhaler Inhale 2 puffs into the lungs every 6 (six) hours as needed for wheezing or shortness of breath. 10/09/13   Suzan Slick, MD  atorvastatin (LIPITOR) 80 MG tablet Take 40 mg by mouth 2 (two) times daily.    [provider]  Cholecalciferol (VITAMIN D3) 1000 units CAPS Take 1 capsule by mouth daily.     [provider]  famotidine (PEPCID) 20 MG tablet Take by mouth. 04/18/21   [provider]  fluticasone (FLONASE) 50 MCG/ACT nasal spray Place 1 spray into both nostrils daily as needed for allergies. 12/22/20   [provider]  gabapentin (NEURONTIN) 300 MG capsule Take 300 mg by mouth daily. 10/19/21   [provider]  lisinopril-hydrochlorothiazide (ZESTORETIC) 20-12.5  MG tablet Take 1 tablet by mouth daily. 10/19/21   [provider]  naproxen (NAPROSYN) 500 MG tablet Take by mouth. 07/05/20   [provider]  nitroGLYCERIN (NITROSTAT) 0.4 MG SL tablet Place 1 tablet (0.4 mg total) under the tongue every 5 (five) minutes as needed for chest pain. 05/15/18 01/11/22  Laqueta Linden, MD  omeprazole (PRILOSEC) 40 MG capsule Take 1 capsule (40 mg total) by mouth 2 (two) times daily before a meal. 02/06/22 08/05/22  Lanelle Bal, DO  Vitamin D, Ergocalciferol, (DRISDOL) 1.25 MG (50000 UNIT) CAPS capsule Take 50,000 Units by mouth once a week. 10/19/21   [provider]      Allergies    Patient has no known allergies.    Review of Systems   Review of Systems  All other systems reviewed and are negative.   Physical Exam Updated Vital Signs BP (!) 132/99 (BP Location: Left Arm)   Pulse 74   Temp 97.8 F (36.6 C) (Oral)   Resp 16   Ht 1.727 m (5\' 8" )   Wt 81.6 kg   SpO2 99%   BMI 27.37 kg/m  Physical Exam Vitals and nursing note reviewed.  Constitutional:      General: He is not in acute distress.    Appearance: He is well-developed.  HENT:     Head: Normocephalic and atraumatic.  Mouth/Throat:     Pharynx: No oropharyngeal exudate.  Eyes:     General: No scleral icterus.       Right eye: No discharge.        Left eye: No discharge.     Conjunctiva/sclera: Conjunctivae normal.     Pupils: Pupils are equal, round, and reactive to light.  Neck:     Thyroid: No thyromegaly.     Vascular: No JVD.  Cardiovascular:     Rate and Rhythm: Normal rate and regular rhythm.     Heart sounds: Normal heart sounds. No murmur heard.    No friction rub. No gallop.  Pulmonary:     Effort: Pulmonary effort is normal. No respiratory distress.     Breath sounds: Normal breath sounds. No wheezing or rales.  Abdominal:     General: Bowel sounds are normal. There is no distension.     Palpations: Abdomen is soft. There is no  mass.     Tenderness: There is no abdominal tenderness.  Musculoskeletal:        General: No tenderness. Normal range of motion.     Cervical back: Normal range of motion and neck supple.  Lymphadenopathy:     Cervical: No cervical adenopathy.  Skin:    General: Skin is warm and dry.     Findings: No erythema or rash.  Neurological:     Mental Status: He is alert.     Coordination: Coordination normal.     Comments: Totally normal strength and sensation of all 4 extremities, grips are totally equal, normal strength at the shoulder girdle the biceps the triceps and grips in the forearms.  He has totally normal sensation bilaterally.  Normal reflexes at the brachioradialis bilaterally.  There is a small scab on the back of the right shoulder but no signs of tick bites, no residual rash, he does have tenderness over the posterior neck and the right side of the neck, this seems to get worse with looking to the right and better with looking to the left, no lymphadenopathy, neck is very supple  Psychiatric:        Behavior: Behavior normal.     ED Results / Procedures / Treatments   Labs (all labs ordered are listed, but only abnormal results are displayed) Labs Reviewed - No data to display  EKG None  Radiology No results found.  Procedures Procedures    Medications Ordered in ED Medications - No data to display  ED Course/ Medical Decision Making/ A&P                           Medical Decision Making  Suspect the patient has a radicular symptoms from the cervical nerve however at this time he is symptom-free.  We will give him a course of prednisone and have him follow-up with his family doctor.  May need referral to spinal specialist if this continues but at this time there is no need for emergent MRI or urgent transfer for stabilizing care.  The patient appears very well, vital signs are unremarkable, he has no acute neurologic symptoms at this time.        Final  Clinical Impression(s) / ED Diagnoses Final diagnoses:  Cervical radiculopathy    Rx / DC Orders ED Discharge Orders          Ordered    predniSONE (DELTASONE) 20 MG tablet  Daily  07/29/22 1523              Eber Hong, MD 07/29/22 1524

## 2022-07-29 NOTE — Discharge Instructions (Signed)
Take prednisone daily for 5 days, return for severe or worsening weakness or numbness or pain, follow-up with your doctor this week for a recheck.  You may need an MRI or referral to a spinal specialist if this continues or worsens.

## 2022-07-29 NOTE — ED Triage Notes (Addendum)
Patient c/o right shoulder pain related to wound. Per patient believes he had tick bite x3 weeks ago. Patient scratched area and states now he is tender to touch and pain in area is increased. Denies any fevers. Small scabbed area noted. No redness or streaking noted.

## 2022-08-17 ENCOUNTER — Other Ambulatory Visit (HOSPITAL_COMMUNITY): Payer: Self-pay | Admitting: Adult Health

## 2022-08-17 ENCOUNTER — Ambulatory Visit (HOSPITAL_COMMUNITY)
Admission: RE | Admit: 2022-08-17 | Discharge: 2022-08-17 | Disposition: A | Discharge status: Home / Self Care | Payer: Medicare Other | Source: Ambulatory Visit | Attending: Adult Health | Admitting: Adult Health

## 2022-08-17 DIAGNOSIS — M542 Cervicalgia: Secondary | ICD-10-CM | POA: Diagnosis present

## 2022-09-14 ENCOUNTER — Other Ambulatory Visit (HOSPITAL_COMMUNITY): Payer: Self-pay | Admitting: Adult Health

## 2022-09-14 DIAGNOSIS — M542 Cervicalgia: Secondary | ICD-10-CM

## 2022-09-14 DIAGNOSIS — M5080 Other cervical disc disorders, unspecified cervical region: Secondary | ICD-10-CM

## 2022-09-14 DIAGNOSIS — M5412 Radiculopathy, cervical region: Secondary | ICD-10-CM

## 2022-10-03 ENCOUNTER — Ambulatory Visit (HOSPITAL_COMMUNITY): Payer: Medicare HMO

## 2022-10-03 ENCOUNTER — Encounter (HOSPITAL_COMMUNITY): Payer: Self-pay

## 2022-10-19 ENCOUNTER — Ambulatory Visit (HOSPITAL_COMMUNITY): Payer: Medicare HMO

## 2022-12-05 ENCOUNTER — Encounter: Payer: Self-pay | Admitting: *Deleted

## 2023-03-14 ENCOUNTER — Ambulatory Visit: Payer: Medicare HMO | Attending: Internal Medicine | Admitting: Internal Medicine

## 2023-03-14 ENCOUNTER — Encounter: Payer: Self-pay | Admitting: Internal Medicine

## 2023-03-14 VITALS — BP 110/70 | HR 83 | Ht 68.0 in | Wt 179.0 lb

## 2023-03-14 DIAGNOSIS — I25708 Atherosclerosis of coronary artery bypass graft(s), unspecified, with other forms of angina pectoris: Secondary | ICD-10-CM

## 2023-03-14 DIAGNOSIS — I10 Essential (primary) hypertension: Secondary | ICD-10-CM | POA: Diagnosis not present

## 2023-03-14 DIAGNOSIS — I739 Peripheral vascular disease, unspecified: Secondary | ICD-10-CM

## 2023-03-14 DIAGNOSIS — R079 Chest pain, unspecified: Secondary | ICD-10-CM

## 2023-03-14 MED ORDER — NITROGLYCERIN 0.4 MG SL SUBL: 0.4000 mg | SUBLINGUAL_TABLET | SUBLINGUAL | 3 refills | Status: AC | PRN

## 2023-03-14 MED ORDER — METOPROLOL TARTRATE 25 MG PO TABS: 25.0000 mg | ORAL_TABLET | Freq: Two times a day (BID) | ORAL | 1 refills | Status: DC

## 2023-03-14 MED ORDER — CLOPIDOGREL BISULFATE 75 MG PO TABS: 75.0000 mg | ORAL_TABLET | Freq: Every day | ORAL | 1 refills | Status: DC

## 2023-03-14 NOTE — Patient Instructions (Addendum)
Medication Instructions:  Your physician has recommended you make the following change in your medication:  Stop taking Lisinopril-hydrochlorothiazide Start taking Metoprolol 25 mg twice a day Continue taking all other medications as prescribed  Labwork: None  Testing/Procedures: Your physician has requested that you have an echocardiogram. Echocardiography is a painless test that uses sound waves to create images of your heart. It provides your doctor with information about the size and shape of your heart and how well your heart's chambers and valves are working. This procedure takes approximately one hour. There are no restrictions for this procedure. Please do NOT wear cologne, perfume, aftershave, or lotions (deodorant is allowed). Please arrive 15 minutes prior to your appointment time.  Your physician has requested that you have an ankle brachial index (ABI). During this test an ultrasound and blood pressure cuff are used to evaluate the arteries that supply the arms and legs with blood. Allow thirty minutes for this exam. There are no restrictions or special instructions.   Follow-Up: Your physician recommends that you schedule a follow-up appointment in: 2 week follow up  Any Other Special Instructions Will Be Listed Below (If Applicable).  If you need a refill on your cardiac medications before your next appointment, please call your pharmacy.

## 2023-03-16 DIAGNOSIS — I2510 Atherosclerotic heart disease of native coronary artery without angina pectoris: Secondary | ICD-10-CM | POA: Insufficient documentation

## 2023-03-16 DIAGNOSIS — I739 Peripheral vascular disease, unspecified: Secondary | ICD-10-CM | POA: Insufficient documentation

## 2023-03-16 NOTE — Progress Notes (Signed)
Cardiology Office Note  Date: 03/16/2023   ID: Craig Harmon, DOB [birth year], MRN [id number]  PCP:  Marjo Bicker, MD  Cardiologist:  None Electrophysiologist:  None   Reason for Office Visit: CAD evaluation   History of Present Illness: Craig Harmon is a 72 y.o. male known to have CAD s/p OM1 PCI, s/p 3v CABG (LIMA to LAD, SVG to RPDA, SVG to OM), s/p ostial LCx PCI in 2018, HTN, HLD, moderate aortic valve stenosis, nicotine abuse was referred to cardiology clinic for evaluation of CAD and chest pain.  Patient has ongoing chest pain for the last 1 to 2 months, almost daily exertional chest pains lasting for about 30 minutes and resolves with rest spontaneously. Duration and intensity of the chest pains have been progressively worsening.  He is currently retired and stays at home. He said he is smoking more than normal now as he feels bored at home.  No DOE, orthopnea, PND, leg swelling, dizziness, syncope, palpitations.  He also reports having pain in his calves with exertion and resolves with rest.  Past Medical History:  Diagnosis Date   Arthritis    CAD (coronary artery disease)    a. s/p prior CABG x 3 9LIMA->LAD, VG->RPDA, VG->OM;  b. s/p prior stenting of OM1;  c. 03/2017 Cath/PCI: LAD 100, LCX 90p (3.5x20 Synergy DES), OM1 90 ISR (CBA), RCA 100p, LIMA->LAD ok, VG->OM 100, VG->RPDA 100.   Enlarged thyroid    GERD (gastroesophageal reflux disease)    HLD (hyperlipidemia)    HTN (hypertension)    Moderate aortic stenosis    a. 03/2017 Echo: EF 60-65%, Gr2 DD, mod AS.   Tobacco abuse     Past Surgical History:  Procedure Laterality Date   BACK SURGERY     BALLOON DILATION N/A 02/06/2022   Procedure: BALLOON DILATION;  Surgeon: Lanelle Bal, DO;  [facility];  Service: Endoscopy;  Laterality: N/A;   BIOPSY  02/06/2022   Procedure: BIOPSY;  Surgeon: Lanelle Bal, DO;  [facility];  Service: Endoscopy;;   CORONARY ARTERY BYPASS  GRAFT  2008   CORONARY BALLOON ANGIOPLASTY N/A 03/26/2017   Procedure: Coronary Balloon Angioplasty;  Surgeon: Tonny Bollman, MD;  Location: [facility] INVASIVE CV LAB;  Service: Cardiovascular;  Laterality: N/A;   CORONARY STENT INTERVENTION N/A 03/26/2017   Procedure: Coronary Stent Intervention;  Surgeon: Tonny Bollman, MD;  Location: [facility] INVASIVE CV LAB;  Service: Cardiovascular;  Laterality: N/A;   ESOPHAGOGASTRODUODENOSCOPY (EGD) WITH PROPOFOL N/A 02/06/2022   Procedure: ESOPHAGOGASTRODUODENOSCOPY (EGD) WITH PROPOFOL;  Surgeon: Lanelle Bal, DO;  [facility];  Service: Endoscopy;  Laterality: N/A;  8:15am   RIGHT/LEFT HEART CATH AND CORONARY/GRAFT ANGIOGRAPHY N/A 03/26/2017   Procedure: Right/Left Heart Cath and Coronary/Graft Angiography;  Surgeon: Tonny Bollman, MD;  Location: [facility] INVASIVE CV LAB;  Service: Cardiovascular;  Laterality: N/A;    Current Outpatient Medications  Medication Sig Dispense Refill   albuterol (PROVENTIL HFA;VENTOLIN HFA) 108 (90 BASE) MCG/ACT inhaler Inhale 2 puffs into the lungs every 6 (six) hours as needed for wheezing or shortness of breath. 1 Inhaler 5   atorvastatin (LIPITOR) 80 MG tablet Take 40 mg by mouth 2 (two) times daily.     Cholecalciferol (VITAMIN D3) 1000 units CAPS Take 1 capsule by mouth daily.      famotidine (PEPCID) 20 MG tablet Take by mouth.     fluticasone (FLONASE) 50 MCG/ACT nasal spray Place 1 spray into both nostrils  daily as needed for allergies.     gabapentin (NEURONTIN) 300 MG capsule Take 300 mg by mouth daily.     metoprolol tartrate (LOPRESSOR) 25 MG tablet Take 1 tablet (25 mg total) by mouth 2 (two) times daily. 180 tablet 1   naproxen (NAPROSYN) 500 MG tablet Take by mouth.     omeprazole (PRILOSEC) 40 MG capsule Take 1 capsule (40 mg total) by mouth 2 (two) times daily before a meal. 60 capsule 5   predniSONE (DELTASONE) 20 MG tablet Take 2 tablets (40 mg total) by mouth daily. 10 tablet 0   Vitamin D,  Ergocalciferol, (DRISDOL) 1.25 MG (50000 UNIT) CAPS capsule Take 50,000 Units by mouth once a week.     clopidogrel (PLAVIX) 75 MG tablet Take 1 tablet (75 mg total) by mouth daily. 90 tablet 1   nitroGLYCERIN (NITROSTAT) 0.4 MG SL tablet Place 1 tablet (0.4 mg total) under the tongue every 5 (five) minutes x 3 doses as needed for chest pain (if no relief after 3rd dose proceed to ED or call 911). 25 tablet 3   No current facility-administered medications for this visit.   Allergies:  Patient has no known allergies.   Social History: The patient  reports that he has been smoking cigarettes. He started smoking about 51 years ago. He has a 22.5 pack-year smoking history. He has never used smokeless tobacco. He reports that he does not currently use alcohol. He reports that he does not use drugs.   Family History: The patient's family history includes Alcohol abuse in his father; CAD in his brother; Heart attack in his mother.   ROS:  Please see the history of present illness. Otherwise, complete review of systems is positive for none  All other systems are reviewed and negative.   Physical Exam: VS:  BP 110/70   Pulse 83   Ht 5\' 8"  (1.727 m)   Wt 179 lb (81.2 kg)   SpO2 95%   BMI 27.22 kg/m , BMI Body mass index is 27.22 kg/m.  Wt Readings from Last 3 Encounters:  03/14/23 179 lb (81.2 kg)  07/29/22 180 lb (81.6 kg)  02/06/22 180 lb (81.6 kg)    General: Patient appears comfortable at rest. HEENT: Conjunctiva and lids normal, oropharynx clear with moist mucosa. Neck: Supple, no elevated JVP or carotid bruits, no thyromegaly. Lungs: Clear to auscultation, nonlabored breathing at rest. Cardiac: Regular rate and rhythm, holosystolic murmur with no S2 Abdomen: Soft, nontender, no hepatomegaly, bowel sounds present, no guarding or rebound. Extremities: No pitting edema, distal pulses 2+. Skin: Warm and dry. Musculoskeletal: No kyphosis. Neuropsychiatric: Alert and oriented x3, affect  grossly appropriate.  Recent Labwork: No results found for requested labs within last 365 days.     Component Value Date/Time   CHOL 168 03/25/2017 0519   TRIG 62 03/25/2017 0519   HDL 66 03/25/2017 0519   CHOLHDL 2.5 03/25/2017 0519   VLDL 12 03/25/2017 0519   LDLCALC 90 03/25/2017 0519    Assessment and Plan:  # CAD s/p OM1 PCI, s/p 3v CABG (LIMA to LAD, SVG to RPDA, SVG to OM), unstable angina in 2018 s/p ostial LCx PCI and angioplasty of OM1 with low normal LVEF 50 to 55% and RWMA, currently has daily CCS 2 angina # Moderate aortic valve stenosis in 2023 (Mean PG 23 mmHg, V-max 3.24 m/s, aortic valve area 1.05 cm and dimensionless index 0.31) -Last LHC was in 2018 for unstable angina where he had CTO proximal  LAD, CTO proximal RCA and severe stenosis of LCx with OM1 ISR.  He had patent LIMA to LAD, occluded SVG to RPDA and occluded SVG to OM. He underwent ostial to proximal LCx PCI and angioplasty of OM1 ISR lesion with resolution of angina until 2 months ago when he started to have almost daily exertional chest pains lasting for 30 minutes. Progressively worsening in duration and severity.  Did not have sublingual nitroglycerin pills to take. Will start metoprolol tartrate 25 mg twice daily for antianginal therapy and SL NTG 0.4 mg as needed for breakthrough chest pains. He had moderate aortic valve stenosis in 2018 and 2023. It is also unclear if this chest pains are secondary to CAD or severe aortic valve stenosis as I do not hear S2. Will defer stress testing until I get an echocardiogram. Obtain 2D echocardiogram. Continue Plavix 75 mg once daily and atorvastatin 80 mg nightly (need to educate patient to take 80 mg and nightly and not 40 mg twice daily).  # HLD, unknown values -Continue atorvastatin 80 mg nightly, goal LDL less than 70.  Obtain lipid panel in the next clinic visit.  # Bilateral lower extremity claudication, rule out PAD -Obtain ABI with ultrasound arterial Doppler  lower extremities  # Nicotine abuse -Currently smoking cigarettes, smoking more than normal as is retired and said he feels bored at home.  Counseled on smoking cessation and cutting down cigarette smoking. Smoking cessation instruction/counseling given:  counseled patient on the dangers of tobacco use, advised patient to stop smoking, and reviewed strategies to maximize success    I have spent a total of 45 minutes with patient reviewing chart, EKGs, labs and examining patient as well as establishing an assessment and plan that was discussed with the patient.  > 50% of time was spent in direct patient care.    Medication Adjustments/Labs and Tests Ordered: Current medicines are reviewed at length with the patient today.  Concerns regarding medicines are outlined above.   Tests Ordered: Orders Placed This Encounter  Procedures   EKG 12-Lead   ECHOCARDIOGRAM COMPLETE   VAS [place] ABI WITH/WO TBI    Medication Changes: Meds ordered this encounter  Medications   clopidogrel (PLAVIX) 75 MG tablet    Sig: Take 1 tablet (75 mg total) by mouth daily.    Dispense:  90 tablet    Refill:  1   metoprolol tartrate (LOPRESSOR) 25 MG tablet    Sig: Take 1 tablet (25 mg total) by mouth 2 (two) times daily.    Dispense:  180 tablet    Refill:  1    03/14/2023-New   nitroGLYCERIN (NITROSTAT) 0.4 MG SL tablet    Sig: Place 1 tablet (0.4 mg total) under the tongue every 5 (five) minutes x 3 doses as needed for chest pain (if no relief after 3rd dose proceed to ED or call 911).    Dispense:  25 tablet    Refill:  3    Disposition:  Follow up  2 weeks  Signed Emani Taussig Verne Spurr, MD, 03/16/2023 1:38 PM    [facility] at [facility] [address] [place], [place], [place] [address]

## 2023-03-19 ENCOUNTER — Ambulatory Visit (INDEPENDENT_AMBULATORY_CARE_PROVIDER_SITE_OTHER): Payer: Medicare HMO | Admitting: Gastroenterology

## 2023-03-19 ENCOUNTER — Encounter: Payer: Self-pay | Admitting: Gastroenterology

## 2023-03-19 VITALS — BP 107/68 | HR 94 | Temp 98.4°F | Ht 68.0 in | Wt 182.4 lb

## 2023-03-19 DIAGNOSIS — R1013 Epigastric pain: Secondary | ICD-10-CM | POA: Diagnosis not present

## 2023-03-19 DIAGNOSIS — K449 Diaphragmatic hernia without obstruction or gangrene: Secondary | ICD-10-CM | POA: Diagnosis not present

## 2023-03-19 DIAGNOSIS — R1310 Dysphagia, unspecified: Secondary | ICD-10-CM

## 2023-03-19 DIAGNOSIS — K219 Gastro-esophageal reflux disease without esophagitis: Secondary | ICD-10-CM

## 2023-03-19 DIAGNOSIS — K319 Disease of stomach and duodenum, unspecified: Secondary | ICD-10-CM | POA: Diagnosis not present

## 2023-03-19 DIAGNOSIS — Z8711 Personal history of peptic ulcer disease: Secondary | ICD-10-CM

## 2023-03-19 NOTE — Progress Notes (Deleted)
Craig Harmon Does not read well.    Pantoprazole 20mg  , take two once daily. Stop Goody's 02/17/23. Started ibuprofen, two in morning, two afternoon. For back and leg pain. No longer having abdominal pain for one month.  No BM brbpr, melena.  Bristol 4  daily hasn't seen kids in 30 years.   No etoh in one year. Some occ heartburn. Occ dysphagia.  Swallow few times to get it down

## 2023-03-19 NOTE — Patient Instructions (Signed)
We will be in touch to schedule an upper endoscopy once we have discussed with cardiology. Please bring your medications tomorrow so that we can get a current medication list. We will provide you with updated list at that time.

## 2023-03-19 NOTE — Progress Notes (Signed)
GI Office Note    Referring Provider: Katherine Basset* Primary Care Physician:  Marjo Bicker, MD  Primary Gastroenterologist: Hennie Duos. Marletta Lor, DO   Chief Complaint   Chief Complaint  Patient presents with   Abdominal Pain    History of Present Illness   Craig Harmon is a 72 y.o. male presenting today for follow-up of abdominal pain.  Seen in the clinic back in May 2023.  History of chronic GERD, dysphagia.  Completed EGD as outlined below.  Patient was not interested in colonoscopy for colon cancer screening.  At time of ov, we were unable to verify his medication list. Patient does not read well. He will bring back his medications for [place] to update his list.   Recently seen by cardiology for chest pain. ECHO pending.   Patient states he has been taking pantoprazole 20mg , two at one time daily and stopped Goody's powders 02/17/23 after going to the ED for abdominal pain. While in ED, CT as outlined below. CBC unremarkable. Creatinine 0.74. LFTs normal except for alk phos 129. Lipase normal.    Started on ibuprofen, two in morning and two in afternoon at that time. States he was having abdominal pain but none in the past one month. No brbpr, melena. Stools regular, Bristol 4. No etoh in over one year. Occasional heartburn, dysphagia. Hasn't seen kids in 30 years.   CT A/P without contrast 02/17/23: -small non-obstructing renal stones -inflammatory changes surrounding second portion of duodenum without definitive ulcer crater -hiatal hernia  EGD in 01/2022: -Large hiatal hernia with multiple Sheria Lang ulcers -Mild Schatzki ring status post dilation -Gastritis -Nonbleeding gastric ulcers -Normal duodenal bulb, first portion of duodenum and second portion of duodenum -Mild active chronic gastritis with reactive changes.  No H. pylori.  Medications   Current Outpatient Medications  Medication Sig Dispense Refill   albuterol (PROVENTIL HFA;VENTOLIN HFA)  108 (90 BASE) MCG/ACT inhaler Inhale 2 puffs into the lungs every 6 (six) hours as needed for wheezing or shortness of breath. 1 Inhaler 5   atorvastatin (LIPITOR) 40 MG tablet Take 40 mg by mouth daily.     fluticasone (FLONASE) 50 MCG/ACT nasal spray Place 1 spray into both nostrils daily as needed for allergies.     gabapentin (NEURONTIN) 300 MG capsule Take 300 mg by mouth daily.     ibuprofen (ADVIL) 200 MG tablet Take 200 mg by mouth every 6 (six) hours as needed.     losartan (COZAAR) 25 MG tablet Take 25 mg by mouth daily.     metoprolol tartrate (LOPRESSOR) 25 MG tablet Take 1 tablet (25 mg total) by mouth 2 (two) times daily. 180 tablet 1   nitroGLYCERIN (NITROSTAT) 0.4 MG SL tablet Place 1 tablet (0.4 mg total) under the tongue every 5 (five) minutes x 3 doses as needed for chest pain (if no relief after 3rd dose proceed to ED or call 911). 25 tablet 3   pantoprazole (PROTONIX) 20 MG tablet Take 40 mg by mouth daily.     No current facility-administered medications for this visit.    Allergies   Allergies as of 03/19/2023   (No Known Allergies)     Past Medical History   Past Medical History:  Diagnosis Date   Arthritis    CAD (coronary artery disease)    a. s/p prior CABG x 3 9LIMA->LAD, VG->RPDA, VG->OM;  b. s/p prior stenting of OM1;  c. 03/2017 Cath/PCI: LAD 100, LCX 90p (3.5x20 Synergy  DES), OM1 90 ISR (CBA), RCA 100p, LIMA->LAD ok, VG->OM 100, VG->RPDA 100.   Enlarged thyroid    GERD (gastroesophageal reflux disease)    HLD (hyperlipidemia)    HTN (hypertension)    Moderate aortic stenosis    a. 03/2017 Echo: EF 60-65%, Gr2 DD, mod AS.   Tobacco abuse     Past Surgical History   Past Surgical History:  Procedure Laterality Date   BACK SURGERY     BALLOON DILATION N/A 02/06/2022   Procedure: BALLOON DILATION;  Surgeon: Lanelle Bal, DO;  [facility];  Service: Endoscopy;  Laterality: N/A;   BIOPSY  02/06/2022   Procedure: BIOPSY;  Surgeon: Lanelle Bal, DO;  [facility];  Service: Endoscopy;;   CORONARY ARTERY BYPASS GRAFT  2008   CORONARY BALLOON ANGIOPLASTY N/A 03/26/2017   Procedure: Coronary Balloon Angioplasty;  Surgeon: Tonny Bollman, MD;  Location: [facility] INVASIVE CV LAB;  Service: Cardiovascular;  Laterality: N/A;   CORONARY STENT INTERVENTION N/A 03/26/2017   Procedure: Coronary Stent Intervention;  Surgeon: Tonny Bollman, MD;  Location: [facility] INVASIVE CV LAB;  Service: Cardiovascular;  Laterality: N/A;   ESOPHAGOGASTRODUODENOSCOPY (EGD) WITH PROPOFOL N/A 02/06/2022   Procedure: ESOPHAGOGASTRODUODENOSCOPY (EGD) WITH PROPOFOL;  Surgeon: Lanelle Bal, DO;  [facility];  Service: Endoscopy;  Laterality: N/A;  8:15am   RIGHT/LEFT HEART CATH AND CORONARY/GRAFT ANGIOGRAPHY N/A 03/26/2017   Procedure: Right/Left Heart Cath and Coronary/Graft Angiography;  Surgeon: Tonny Bollman, MD;  Location: [facility] INVASIVE CV LAB;  Service: Cardiovascular;  Laterality: N/A;    Past Family History   Family History  Problem Relation Age of Onset   Heart attack Mother    Alcohol abuse Father    CAD Brother     Past Social History   Social History   Socioeconomic History   Marital status: Divorced    Spouse name: Not on file   Number of children: Not on file   Years of education: Not on file   Highest education level: Not on file  Occupational History   Not on file  Tobacco Use   Smoking status: Every Day    Current packs/day: 0.00    Average packs/day: 0.5 packs/day for 45.0 years (22.5 ttl pk-yrs)    Types: Cigarettes    Start date: 08/12/1971    Last attempt to quit: 08/11/2016    Years since quitting: 6.6   Smokeless tobacco: Never  Vaping Use   Vaping status: Never Used  Substance and Sexual Activity   Alcohol use: Not Currently    Comment: no etoh in one year, 03/19/23   Drug use: No   Sexual activity: Never  Other Topics Concern   Not on file  Social History Narrative   Not on file   Social  Determinants of Health   Financial Resource Strain: Not on file  Food Insecurity: Not on file  Transportation Needs: Not on file  Physical Activity: Not on file  Stress: Not on file  Social Connections: Not on file  Intimate Partner Violence: Not on file    Review of Systems   General: Negative for anorexia, weight loss, fever, chills, fatigue, weakness. ENT: Negative for hoarseness,  nasal congestion. See hpi CV: Negative for chest pain, angina, palpitations, dyspnea on exertion, peripheral edema.  Respiratory: Negative for dyspnea at rest, dyspnea on exertion, cough, sputum, wheezing.  GI: See history of present illness. GU:  Negative for dysuria, hematuria, urinary incontinence, urinary frequency, nocturnal urination.  Endo: Negative for unusual weight change.     Physical Exam   BP 107/68 (BP Location: Right Arm, Patient Position: Sitting, Cuff Size: Normal)   Pulse 94   Temp 98.4 F (36.9 C) (Oral)   Ht 5\' 8"  (1.727 m)   Wt 182 lb 6.4 oz (82.7 kg)   SpO2 98%   BMI 27.73 kg/m    General: Well-nourished, well-developed in no acute distress.  Eyes: No icterus. Mouth: Oropharyngeal mucosa moist and pink , no lesions erythema or exudate. Lungs: Clear to auscultation bilaterally.  Heart: Regular rate and rhythm, no murmurs rubs or gallops.  Abdomen: Bowel sounds are normal,  nondistended, no hepatosplenomegaly or masses,  no abdominal bruits or hernia , no rebound or guarding. Lower abdominal pain. Rectal: not performed Extremities: No lower extremity edema. No clubbing or deformities. Neuro: Alert and oriented x 4   Skin: Warm and dry, no jaundice.   Psych: Alert and cooperative, normal mood and affect.  Labs   See hpi Imaging Studies   No results found.  Assessment   *abdominal pain *abnormal duodenum on CT *dysphagia *h/o gastric ulcers *large hiatal hernia  History of large hiatal hernia, schatzki ring, Cameron ulcers/gastric ulcers. Now on CT concern  for inflammatory changes of duodenum. He had been on Goody's powders until recently but still reports ibuprofen use. Back on PPI with improvement of abdominal pain. Recommend EGD for evaluation and possible dilation of esophagus again, did receive some benefit last time. Large hiatal hernia may also be contributing to his dysphagia symptoms.   Once cleared from cardiac standpoint, would proceed with EGD.    PLAN   EGD +/- ED with Dr. Marletta Lor. ASA 3  I have discussed the risks, alternatives, benefits with regards to but not limited to the risk of reaction to medication, bleeding, infection, perforation and the patient is agreeable to proceed. Written consent to be obtained. We will need cardiac clearance given current cardiac work up in progress.  If he is still on Plavix, we will need to hold for 5 days before.  Continue pantoprazole 40mg  daily.  Would recommend avoiding ASA powders and NSAIDs.   Leanna Battles. Melvyn Neth, MHS, PA-C [facility]

## 2023-03-20 ENCOUNTER — Telehealth: Payer: Self-pay

## 2023-03-20 NOTE — Telephone Encounter (Signed)
Pt came in with medications. Medication list has been updated.

## 2023-03-25 DIAGNOSIS — K219 Gastro-esophageal reflux disease without esophagitis: Secondary | ICD-10-CM | POA: Insufficient documentation

## 2023-03-25 DIAGNOSIS — K319 Disease of stomach and duodenum, unspecified: Secondary | ICD-10-CM | POA: Insufficient documentation

## 2023-03-25 DIAGNOSIS — Z8711 Personal history of peptic ulcer disease: Secondary | ICD-10-CM | POA: Insufficient documentation

## 2023-03-25 DIAGNOSIS — R1013 Epigastric pain: Secondary | ICD-10-CM | POA: Insufficient documentation

## 2023-03-25 DIAGNOSIS — K449 Diaphragmatic hernia without obstruction or gangrene: Secondary | ICD-10-CM | POA: Insufficient documentation

## 2023-03-25 DIAGNOSIS — R1310 Dysphagia, unspecified: Secondary | ICD-10-CM | POA: Insufficient documentation

## 2023-03-25 NOTE — Telephone Encounter (Signed)
Tammy, please find out if he is still on plavix (clopidogrel). He was on it recently when he saw cardiology. We may need to check with [employer] too. I added ibuprofen to his med list because he told me he was taking four per day as needed.    Please schedule for EGD+/-ED with Marletta Lor. ASA 3.  Dx: abd pain, dysphagia, abnormal duodenum on CT, prior history of gastric ulcers.  He will need cardiac clearance as he is currently going through cardiac work up. Will need clearance to hold plavix five days if he is still on it. I believe he may be even though he did not recently report.

## 2023-03-26 ENCOUNTER — Other Ambulatory Visit: Payer: Medicare HMO

## 2023-03-26 ENCOUNTER — Telehealth: Payer: Self-pay | Admitting: *Deleted

## 2023-03-26 NOTE — Telephone Encounter (Signed)
Noted. Yes he will need clearance to hold plavix for five days as well as cardiac clearance. Thanks.

## 2023-03-26 NOTE — Telephone Encounter (Signed)
   Name: Craig Harmon  DOB: 1951/07/03  MRN: [id number]  Primary Cardiologist: None  Chart reviewed as part of pre-operative protocol coverage. The patient has an upcoming visit scheduled with Sharlene Dory, NP  on March 30, 2023 at 2:30 at which time clearance can be addressed in case there are any issues that would impact surgical recommendations.  EDG Is not scheduled until TBD as below. I added preop FYI to appointment note so that provider is aware to address at time of outpatient visit.  Per office protocol the cardiology provider should forward their finalized clearance decision and recommendations regarding antiplatelet therapy to the requesting party below.    Most recent PCI in 2018 of OM1 on Plavix. He has echocardiogram planned on 03/27/2023 before the next appt with cardiology. Pre-Op  will be addressed on follow up appt listed above for further recommendations and clearance.   I will route this message as FYI to requesting party and remove this message from the preop box as separate preop APP input not needed at this time.   Please call with any questions.  Joni Reining, NP  03/26/2023, 10:35 AM

## 2023-03-26 NOTE — Telephone Encounter (Signed)
  Request for patient to stop medication prior to procedure or is needing cleareance  03/26/23  [name] Jul 15, 1951  What type of surgery is being performed? EGD with possible esophageal dilation  When is surgery scheduled? TBD  What type of clearance is required (medical or pharmacy to hold medication or both? Both   Are there any medications that need to be held prior to surgery and how long? Plavix 75mg  x 5 days prior  Name of physician performing surgery?  Dr. Earnest Bailey [facility] at [facility] Phone: [phone] Fax: [phone]  Anethesia type (none, local, MAC, general)? MAC

## 2023-03-26 NOTE — Telephone Encounter (Signed)
Called [employer]. He last had plavix 75mg  filled 6/12 for 90 day supply. Looks like cardiology also refilled it 7/10 for 90 supply also.   Will send for clearance to cardiology also.

## 2023-03-27 ENCOUNTER — Ambulatory Visit (INDEPENDENT_AMBULATORY_CARE_PROVIDER_SITE_OTHER): Payer: Medicare HMO

## 2023-03-27 ENCOUNTER — Ambulatory Visit: Payer: Medicare HMO | Attending: Internal Medicine

## 2023-03-27 DIAGNOSIS — R079 Chest pain, unspecified: Secondary | ICD-10-CM

## 2023-03-27 DIAGNOSIS — I739 Peripheral vascular disease, unspecified: Secondary | ICD-10-CM

## 2023-03-27 LAB — ECHOCARDIOGRAM COMPLETE
AR max vel: 0.71 cm2
AV Area VTI: 0.73 cm2
AV Area mean vel: 0.72 cm2
AV Mean grad: 27 mmHg
AV Peak grad: 46.9 mmHg
Ao pk vel: 3.43 m/s
Area-P 1/2: 3.51 cm2
Calc EF: 55.7 %
Est EF: 55
MV VTI: 1.8 cm2
S' Lateral: 2.8 cm
Single Plane A2C EF: 60.7 %
Single Plane A4C EF: 51 %

## 2023-03-27 LAB — VAS US ABI WITH/WO TBI
Left ABI: 152
Right ABI: 152

## 2023-03-28 NOTE — Telephone Encounter (Signed)
Noted  

## 2023-03-30 ENCOUNTER — Ambulatory Visit: Payer: Medicare HMO | Admitting: Nurse Practitioner

## 2023-04-06 ENCOUNTER — Telehealth: Payer: Self-pay

## 2023-04-06 DIAGNOSIS — I739 Peripheral vascular disease, unspecified: Secondary | ICD-10-CM

## 2023-04-06 NOTE — Telephone Encounter (Signed)
Patient notified and verbalized understanding. Patient had no questions or concerns at this time.  

## 2023-04-06 NOTE — Telephone Encounter (Signed)
-----   Message from Vishnu P Mallipeddi sent at 04/06/2023  8:13 AM EDT ----- Normal ABI values but based on the arterial Doppler waveforms at the ankle, there is some component of poor circulation in the feet.  Obtain ultrasound arterial Doppler of bilateral lower extremities.

## 2023-04-19 ENCOUNTER — Ambulatory Visit: Payer: Medicare HMO | Attending: Nurse Practitioner | Admitting: Nurse Practitioner

## 2023-04-19 ENCOUNTER — Encounter: Payer: Self-pay | Admitting: *Deleted

## 2023-04-19 ENCOUNTER — Encounter: Payer: Self-pay | Admitting: Nurse Practitioner

## 2023-04-19 ENCOUNTER — Telehealth: Payer: Self-pay | Admitting: Nurse Practitioner

## 2023-04-19 VITALS — BP 106/66 | HR 82 | Wt 183.0 lb

## 2023-04-19 DIAGNOSIS — Z01812 Encounter for preprocedural laboratory examination: Secondary | ICD-10-CM | POA: Diagnosis not present

## 2023-04-19 DIAGNOSIS — R0609 Other forms of dyspnea: Secondary | ICD-10-CM | POA: Diagnosis not present

## 2023-04-19 DIAGNOSIS — I2510 Atherosclerotic heart disease of native coronary artery without angina pectoris: Secondary | ICD-10-CM | POA: Diagnosis not present

## 2023-04-19 DIAGNOSIS — I10 Essential (primary) hypertension: Secondary | ICD-10-CM

## 2023-04-19 DIAGNOSIS — I350 Nonrheumatic aortic (valve) stenosis: Secondary | ICD-10-CM

## 2023-04-19 DIAGNOSIS — Z720 Tobacco use: Secondary | ICD-10-CM

## 2023-04-19 DIAGNOSIS — E785 Hyperlipidemia, unspecified: Secondary | ICD-10-CM

## 2023-04-19 DIAGNOSIS — I77819 Aortic ectasia, unspecified site: Secondary | ICD-10-CM

## 2023-04-19 NOTE — Progress Notes (Addendum)
Cardiology Office Note:  .   Date:  04/19/2023 ID:  Craig Harmon, DOB Sep 05, 1950, MRN [id number] PCP: Kara Pacer, NP  [facility] Providers Cardiologist:  Marjo Bicker, MD    History of Present Illness: .   Craig Harmon is a 72 y.o. male with a PMH of CAD, s/p CABG x 3, HLD, HTN, aortic stenosis, and tobacco abuse, who presents today for scheduled follow-up.   Last seen by [name] on March 14, 2023.  He noted daily exertional chest pain lasting for about 20 minutes with spontaneous resolution. He stated that duration and intensity of his symptoms were progressively getting worse over time.  Did endorse more tobacco use than normal.  Did note claudication type pain.  Echocardiogram revealed normal LVEF, grade 1 DD, moderate to severe aortic stenosis.  Normal ABIs, arterial duplex arranged for further evaluation.  [name] based on echocardiogram results recommended to reevaluate his symptoms at follow-up and schedule for left heart cath/right heart cath.  Today he presents for follow-up with his daughter.  He denies any chest pain but both daughter and patient state he is very tired at times and also experiences shortness of breath with exertion. Pt does feel as though his breathing is getting worse over time, not able to walk as far as he used to without getting short of breath. Denies any chest pain, palpitations, syncope, presyncope, dizziness, orthopnea, PND, swelling or significant weight changes, acute bleeding, or claudication.  Studies Reviewed: Marland Kitchen     ABI's 03/2023:  Summary:  Right: The right toe-brachial index is normal.  Although ankle brachial indices are within normal limits (0.95-1.29),  arterial Doppler waveforms at the ankle suggest some component of arterial  occlusive disease.  Left: The left toe-brachial index is normal.  Although ankle brachial indices are within normal limits (0.95-1.29),  arterial Doppler waveforms at the  ankle suggest some component of arterial  occlusive disease.  Echo 03/2023: 1. Left ventricular ejection fraction, by estimation, is 55%. The left  ventricle has normal function. The left ventricle demonstrates regional wall motion abnormalities (see scoring diagram/findings for description). There is moderate concentric left ventricular hypertrophy. Left ventricular diastolic parameters are  consistent with Grade I diastolic dysfunction (impaired relaxation).   2. Right ventricular systolic function is normal. The right ventricular  size is normal. There is normal pulmonary artery systolic pressure.   3. Left atrial size was mildly dilated.   4. The mitral valve is degenerative. Trivial mitral valve regurgitation.  The mean mitral valve gradient is 2.0 mmHg.   5. The aortic valve is tricuspid. There is moderate calcification of the  aortic valve. Aortic valve regurgitation is not visualized. Moderate to  severe aortic valve stenosis. Aortic valve mean gradient measures 27.0 mmHg. Aortic valve Vmax measures 3.43 m/s. Dimenionless index 0.23.   6. There is moderate dilatation of the ascending aorta, measuring 45 mm.   7. The inferior vena cava is normal in size with greater than 50%  respiratory variability, suggesting right atrial pressure of 3 mmHg.   Comparison(s): Prior images reviewed side by side. LVEF approximately 55% with stable wall motion abnormalities. Moderate to severe aortic stenosis with increased mean gradient to 27 mmHg.  Right/left heart cath 03/2017: 1. Severe three-vessel coronary artery disease with total occlusion of the proximal LAD, total occlusion proximal RCA, and severe stenosis of the left circumflex  2. Status post aortocoronary bypass surgery with continued patency of the LIMA to LAD and total  occlusion of saphenous vein graft presumably to the distal RCA and OM branches 3. 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 Moderate  aortic stenosis with mean transaortic gradient of 24 mmHg, peak to peak gradient 34 mmHg, and calculated aortic valve area 0.96 cm 5. Successful PCI of the proximal left circumflex and OM branches as detailed   Recommend: Dual antiplatelet therapy with aspirin and clopidogrel for 12 months, aggressive risk reduction and tobacco cessation, continued surveillance of aortic stenosis by serial echo assessment. If no complications arise should be ready for discharge tomorrow.   Physical Exam:   VS:  BP 106/66   Pulse 82   Wt 183 lb (83 kg)   SpO2 97%   BMI 27.83 kg/m    Wt Readings from Last 3 Encounters:  04/19/23 183 lb (83 kg)  03/19/23 182 lb 6.4 oz (82.7 kg)  03/14/23 179 lb (81.2 kg)    GEN: Well nourished, well developed in no acute distress NECK: No JVD; No carotid bruits CARDIAC: S1/S2, RRR, Grade 3/6 murmur, no rubs, no gallops RESPIRATORY:  Clear to auscultation without rales, wheezing or rhonchi  ABDOMEN: Soft, non-tender, non-distended EXTREMITIES:  No edema; No deformity   ASSESSMENT AND PLAN: .    CAD, s/p CABG x 3, DOE, pre-procedure lab Pt admits to fatigue and worsening/progressive dyspnea on exertion. Denies any chest pain. [name] recommended with proceeding with right and left heart catheterization for further evaluation that is stated in results note on Echo performed on 03/27/2023. Discussed risks and benefits of procedure, and pt verbalized understanding and is agreeable to proceed. Will obtain CBC and BMET (pre-procedure labs) prior to procedure. Continue current medication regimen at this time. Advised pt to stop taking Ibuprofen and recommended Tylenol PRN for pain. Orders are under signed and held.   Informed Consent   Shared Decision Making/Informed Consent The risks [stroke (1 in 1000), death (1 in 1000), kidney failure [usually temporary] (1 in 500), bleeding (1 in 200), allergic reaction [possibly serious] (1 in 200)], benefits (diagnostic support and  management of coronary artery disease) and alternatives of a cardiac catheterization were discussed in detail with [name] and he is willing to proceed.    2. Aortic valve stenosis Recent TTE revealed moderate to severe aortic valve stenosis with mean gradient measuring 27.0 mmHg. Pt appears to be symptomatic and admits to progressive/worsening dyspnea on exertion (see #1) and fatigue. Arranging right and left heart catheterization as mentioned above. At next follow-up depending on results, would recommend referral to structural heart team. Care and ED precautions discussed.   3. HTN BP stable. No medication changes at this time. Discussed to monitor BP at home at least 2 hours after medications and sitting for 5-10 minutes. Getting labs as mentioned above. Heart healthy diet encouraged.   4. HLD Last LDL 95 12/2022. Goal LDL < 55. Continue atorvastatin. At next office visit, will address increasing atorvastatin to 80 mg daily. Heart healthy diet encouraged.   5. Aortic dilatation Moderate dilatation of ascending aorta noted on recent TTE, measuring 45 mm. At next follow-up will discuss CT imaging for further review. Denies any concerning symptoms. Care and ED precautions discussed.   6. Tobacco abuse  Smoking cessation encouraged and discussed.   Time: I spent 30 minutes with patient and daughter for office visit.   Dispo: Follow-up with me/APP in 3-4 weeks post cardiac catheterization or sooner if anything changes.   Signed, Sharlene Dory, NP

## 2023-04-19 NOTE — Patient Instructions (Signed)
Medication Instructions:   Continue all current medications.   Labwork:  BMET, CBC - orders given   Testing/Procedures:  Your physician has requested that you have a cardiac catheterization. Cardiac catheterization is used to diagnose and/or treat various heart conditions. Doctors may recommend this procedure for a number of different reasons. The most common reason is to evaluate chest pain. Chest pain can be a symptom of coronary artery disease (CAD), and cardiac catheterization can show whether plaque is narrowing or blocking your heart's arteries. This procedure is also used to evaluate the valves, as well as measure the blood flow and oxygen levels in different parts of your heart. For further information please visit [email or web address] Please follow instruction sheet, as given.   Follow-Up:  1 month   Any Other Special Instructions Will Be Listed Below (If Applicable).   If you need a refill on your cardiac medications before your next appointment, please call your pharmacy.

## 2023-04-19 NOTE — Telephone Encounter (Signed)
Checking percert on the following patient for  Right & left heart cath - Craig Harmon - 8/22 - 9:00 - Tresa Endo

## 2023-04-19 NOTE — H&P (View-Only) (Signed)
Cardiology Office Note:  .   Date:  04/19/2023 ID:  Craig Harmon, DOB Sep 05, 1950, MRN [id number] PCP: Kara Pacer, NP  [facility] Providers Cardiologist:  Marjo Bicker, MD    History of Present Illness: .   Craig Harmon is a 72 y.o. male with a PMH of CAD, s/p CABG x 3, HLD, HTN, aortic stenosis, and tobacco abuse, who presents today for scheduled follow-up.   Last seen by [name] on March 14, 2023.  He noted daily exertional chest pain lasting for about 20 minutes with spontaneous resolution. He stated that duration and intensity of his symptoms were progressively getting worse over time.  Did endorse more tobacco use than normal.  Did note claudication type pain.  Echocardiogram revealed normal LVEF, grade 1 DD, moderate to severe aortic stenosis.  Normal ABIs, arterial duplex arranged for further evaluation.  [name] based on echocardiogram results recommended to reevaluate his symptoms at follow-up and schedule for left heart cath/right heart cath.  Today he presents for follow-up with his daughter.  He denies any chest pain but both daughter and patient state he is very tired at times and also experiences shortness of breath with exertion. Pt does feel as though his breathing is getting worse over time, not able to walk as far as he used to without getting short of breath. Denies any chest pain, palpitations, syncope, presyncope, dizziness, orthopnea, PND, swelling or significant weight changes, acute bleeding, or claudication.  Studies Reviewed: Marland Kitchen     ABI's 03/2023:  Summary:  Right: The right toe-brachial index is normal.  Although ankle brachial indices are within normal limits (0.95-1.29),  arterial Doppler waveforms at the ankle suggest some component of arterial  occlusive disease.  Left: The left toe-brachial index is normal.  Although ankle brachial indices are within normal limits (0.95-1.29),  arterial Doppler waveforms at the  ankle suggest some component of arterial  occlusive disease.  Echo 03/2023: 1. Left ventricular ejection fraction, by estimation, is 55%. The left  ventricle has normal function. The left ventricle demonstrates regional wall motion abnormalities (see scoring diagram/findings for description). There is moderate concentric left ventricular hypertrophy. Left ventricular diastolic parameters are  consistent with Grade I diastolic dysfunction (impaired relaxation).   2. Right ventricular systolic function is normal. The right ventricular  size is normal. There is normal pulmonary artery systolic pressure.   3. Left atrial size was mildly dilated.   4. The mitral valve is degenerative. Trivial mitral valve regurgitation.  The mean mitral valve gradient is 2.0 mmHg.   5. The aortic valve is tricuspid. There is moderate calcification of the  aortic valve. Aortic valve regurgitation is not visualized. Moderate to  severe aortic valve stenosis. Aortic valve mean gradient measures 27.0 mmHg. Aortic valve Vmax measures 3.43 m/s. Dimenionless index 0.23.   6. There is moderate dilatation of the ascending aorta, measuring 45 mm.   7. The inferior vena cava is normal in size with greater than 50%  respiratory variability, suggesting right atrial pressure of 3 mmHg.   Comparison(s): Prior images reviewed side by side. LVEF approximately 55% with stable wall motion abnormalities. Moderate to severe aortic stenosis with increased mean gradient to 27 mmHg.  Right/left heart cath 03/2017: 1. Severe three-vessel coronary artery disease with total occlusion of the proximal LAD, total occlusion proximal RCA, and severe stenosis of the left circumflex  2. Status post aortocoronary bypass surgery with continued patency of the LIMA to LAD and total  occlusion of saphenous vein graft presumably to the distal RCA and OM branches 3. 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 Moderate  aortic stenosis with mean transaortic gradient of 24 mmHg, peak to peak gradient 34 mmHg, and calculated aortic valve area 0.96 cm 5. Successful PCI of the proximal left circumflex and OM branches as detailed   Recommend: Dual antiplatelet therapy with aspirin and clopidogrel for 12 months, aggressive risk reduction and tobacco cessation, continued surveillance of aortic stenosis by serial echo assessment. If no complications arise should be ready for discharge tomorrow.   Physical Exam:   VS:  BP 106/66   Pulse 82   Wt 183 lb (83 kg)   SpO2 97%   BMI 27.83 kg/m    Wt Readings from Last 3 Encounters:  04/19/23 183 lb (83 kg)  03/19/23 182 lb 6.4 oz (82.7 kg)  03/14/23 179 lb (81.2 kg)    GEN: Well nourished, well developed in no acute distress NECK: No JVD; No carotid bruits CARDIAC: S1/S2, RRR, Grade 3/6 murmur, no rubs, no gallops RESPIRATORY:  Clear to auscultation without rales, wheezing or rhonchi  ABDOMEN: Soft, non-tender, non-distended EXTREMITIES:  No edema; No deformity   ASSESSMENT AND PLAN: .    CAD, s/p CABG x 3, DOE, pre-procedure lab Pt admits to fatigue and worsening/progressive dyspnea on exertion. Denies any chest pain. [name] recommended with proceeding with right and left heart catheterization for further evaluation that is stated in results note on Echo performed on 03/27/2023. Discussed risks and benefits of procedure, and pt verbalized understanding and is agreeable to proceed. Will obtain CBC and BMET (pre-procedure labs) prior to procedure. Continue current medication regimen at this time. Advised pt to stop taking Ibuprofen and recommended Tylenol PRN for pain. Orders are under signed and held.   Informed Consent   Shared Decision Making/Informed Consent The risks [stroke (1 in 1000), death (1 in 1000), kidney failure [usually temporary] (1 in 500), bleeding (1 in 200), allergic reaction [possibly serious] (1 in 200)], benefits (diagnostic support and  management of coronary artery disease) and alternatives of a cardiac catheterization were discussed in detail with [name] and he is willing to proceed.    2. Aortic valve stenosis Recent TTE revealed moderate to severe aortic valve stenosis with mean gradient measuring 27.0 mmHg. Pt appears to be symptomatic and admits to progressive/worsening dyspnea on exertion (see #1) and fatigue. Arranging right and left heart catheterization as mentioned above. At next follow-up depending on results, would recommend referral to structural heart team. Care and ED precautions discussed.   3. HTN BP stable. No medication changes at this time. Discussed to monitor BP at home at least 2 hours after medications and sitting for 5-10 minutes. Getting labs as mentioned above. Heart healthy diet encouraged.   4. HLD Last LDL 95 12/2022. Goal LDL < 55. Continue atorvastatin. At next office visit, will address increasing atorvastatin to 80 mg daily. Heart healthy diet encouraged.   5. Aortic dilatation Moderate dilatation of ascending aorta noted on recent TTE, measuring 45 mm. At next follow-up will discuss CT imaging for further review. Denies any concerning symptoms. Care and ED precautions discussed.   6. Tobacco abuse  Smoking cessation encouraged and discussed.   Time: I spent 30 minutes with patient and daughter for office visit.   Dispo: Follow-up with me/APP in 3-4 weeks post cardiac catheterization or sooner if anything changes.   Signed, Sharlene Dory, NP

## 2023-04-21 LAB — CBC
Hematocrit: 39.1 % (ref 37.5–51.0)
Hemoglobin: 13 g/dL (ref 13.0–17.7)
MCH: 28.1 pg (ref 26.6–33.0)
MCHC: 33.2 g/dL (ref 31.5–35.7)
MCV: 84 fL (ref 79–97)
Platelets: 275 10*3/uL (ref 150–450)
RBC: 4.63 x10E6/uL (ref 4.14–5.80)
RDW: 14.4 % (ref 11.6–15.4)
WBC: 7.7 10*3/uL (ref 3.4–10.8)

## 2023-04-21 LAB — BASIC METABOLIC PANEL
BUN/Creatinine Ratio: 32 — ABNORMAL HIGH (ref 10–24)
BUN: 20 mg/dL (ref 8–27)
CO2: 24 mmol/L (ref 20–29)
Calcium: 9.4 mg/dL (ref 8.6–10.2)
Chloride: 102 mmol/L (ref 96–106)
Creatinine, Ser: 0.63 mg/dL — ABNORMAL LOW (ref 0.76–1.27)
Glucose: 92 mg/dL (ref 70–99)
Potassium: 4.1 mmol/L (ref 3.5–5.2)
Sodium: 138 mmol/L (ref 134–144)
eGFR: 102 mL/min/{1.73_m2} (ref >59)

## 2023-04-24 ENCOUNTER — Telehealth: Payer: Self-pay | Admitting: *Deleted

## 2023-04-24 NOTE — Telephone Encounter (Signed)
Cardiac Catheterization scheduled at [facility] for: Thursday April 26, 2023 9 AM Arrival time [facility] Main Entrance A at: 7 AM  Nothing to eat after midnight prior to procedure, clear liquids until 5 AM day of procedure.  Medication instructions: -Usual morning medications can be taken with sips of water including aspirin 81 mg and Plavix 75 mg  Plan to go home the same day, you will only stay overnight if medically necessary.  You must have responsible adult to drive you home.  Someone must be with you the first 24 hours after you arrive home.  Reviewed procedure instructions with patient.

## 2023-04-26 ENCOUNTER — Ambulatory Visit (HOSPITAL_COMMUNITY)
Admission: RE | Disposition: A | Discharge status: Home / Self Care | Payer: Self-pay | Source: Home / Self Care | Attending: Cardiovascular Disease

## 2023-04-26 ENCOUNTER — Ambulatory Visit (HOSPITAL_COMMUNITY)
Admission: RE | Admit: 2023-04-26 | Discharge: 2023-04-26 | Disposition: A | Discharge status: Home / Self Care | Payer: Medicare HMO | Attending: Cardiovascular Disease | Admitting: Cardiovascular Disease

## 2023-04-26 DIAGNOSIS — Z79899 Other long term (current) drug therapy: Secondary | ICD-10-CM | POA: Diagnosis not present

## 2023-04-26 DIAGNOSIS — I350 Nonrheumatic aortic (valve) stenosis: Secondary | ICD-10-CM | POA: Diagnosis not present

## 2023-04-26 DIAGNOSIS — I10 Essential (primary) hypertension: Secondary | ICD-10-CM | POA: Diagnosis not present

## 2023-04-26 DIAGNOSIS — I2582 Chronic total occlusion of coronary artery: Secondary | ICD-10-CM | POA: Diagnosis not present

## 2023-04-26 DIAGNOSIS — Z7982 Long term (current) use of aspirin: Secondary | ICD-10-CM | POA: Diagnosis not present

## 2023-04-26 DIAGNOSIS — Z951 Presence of aortocoronary bypass graft: Secondary | ICD-10-CM | POA: Insufficient documentation

## 2023-04-26 DIAGNOSIS — R0609 Other forms of dyspnea: Secondary | ICD-10-CM | POA: Diagnosis not present

## 2023-04-26 DIAGNOSIS — Z7902 Long term (current) use of antithrombotics/antiplatelets: Secondary | ICD-10-CM | POA: Insufficient documentation

## 2023-04-26 DIAGNOSIS — E785 Hyperlipidemia, unspecified: Secondary | ICD-10-CM | POA: Diagnosis not present

## 2023-04-26 DIAGNOSIS — I25118 Atherosclerotic heart disease of native coronary artery with other forms of angina pectoris: Secondary | ICD-10-CM | POA: Diagnosis present

## 2023-04-26 DIAGNOSIS — F17210 Nicotine dependence, cigarettes, uncomplicated: Secondary | ICD-10-CM | POA: Insufficient documentation

## 2023-04-26 DIAGNOSIS — Z955 Presence of coronary angioplasty implant and graft: Secondary | ICD-10-CM | POA: Insufficient documentation

## 2023-04-26 HISTORY — PX: RIGHT/LEFT HEART CATH AND CORONARY/GRAFT ANGIOGRAPHY: CATH118267

## 2023-04-26 LAB — POCT I-STAT 7, (LYTES, BLD GAS, ICA,H+H)
Acid-Base Excess: 0 mmol/L (ref 0.0–2.0)
Bicarbonate: 24.6 mmol/L (ref 20.0–28.0)
Calcium, Ion: 1.26 mmol/L (ref 1.15–1.40)
HCT: 38 % — ABNORMAL LOW (ref 39.0–52.0)
Hemoglobin: 12.9 g/dL — ABNORMAL LOW (ref 13.0–17.0)
O2 Saturation: 97 %
Potassium: 3.8 mmol/L (ref 3.5–5.1)
Sodium: 138 mmol/L (ref 135–145)
TCO2: 26 mmol/L (ref 22–32)
pCO2 arterial: 39.7 mmHg (ref 32–48)
pH, Arterial: 7.401 (ref 7.35–7.45)
pO2, Arterial: 86 mmHg (ref 83–108)

## 2023-04-26 LAB — POCT I-STAT EG7
Acid-Base Excess: 0 mmol/L (ref 0.0–2.0)
Acid-Base Excess: 0 mmol/L (ref 0.0–2.0)
Bicarbonate: 25.4 mmol/L (ref 20.0–28.0)
Bicarbonate: 25.5 mmol/L (ref 20.0–28.0)
Calcium, Ion: 1.21 mmol/L (ref 1.15–1.40)
Calcium, Ion: 1.25 mmol/L (ref 1.15–1.40)
HCT: 37 % — ABNORMAL LOW (ref 39.0–52.0)
HCT: 38 % — ABNORMAL LOW (ref 39.0–52.0)
Hemoglobin: 12.6 g/dL — ABNORMAL LOW (ref 13.0–17.0)
Hemoglobin: 12.9 g/dL — ABNORMAL LOW (ref 13.0–17.0)
O2 Saturation: 71 %
O2 Saturation: 71 %
Patient temperature: 37
Potassium: 3.8 mmol/L (ref 3.5–5.1)
Potassium: 3.8 mmol/L (ref 3.5–5.1)
Sodium: 138 mmol/L (ref 135–145)
Sodium: 139 mmol/L (ref 135–145)
TCO2: 27 mmol/L (ref 22–32)
TCO2: 27 mmol/L (ref 22–32)
pCO2, Ven: 44.2 mmHg (ref 44–60)
pCO2, Ven: 44.4 mmHg (ref 44–60)
pH, Ven: 7.367 (ref 7.25–7.43)
pH, Ven: 7.367 (ref 7.25–7.43)
pO2, Ven: 39 mmHg (ref 32–45)
pO2, Ven: 39 mmHg (ref 32–45)

## 2023-04-26 SURGERY — RIGHT/LEFT HEART CATH AND CORONARY/GRAFT ANGIOGRAPHY
Anesthesia: LOCAL

## 2023-04-26 MED ORDER — SODIUM CHLORIDE 0.9 % WEIGHT BASED INFUSION
3.0000 mL/kg/h | INTRAVENOUS | Status: AC
  Administered 2023-04-26: 3 mL/kg/h via INTRAVENOUS

## 2023-04-26 MED ORDER — IOHEXOL 350 MG/ML SOLN
INTRAVENOUS | Status: DC | PRN
  Administered 2023-04-26: 100 mL

## 2023-04-26 MED ORDER — CLOPIDOGREL BISULFATE 75 MG PO TABS
75.0000 mg | ORAL_TABLET | Freq: Once | ORAL | Status: AC
  Administered 2023-04-26: 75 mg via ORAL
  Filled 2023-04-26: qty 1

## 2023-04-26 MED ORDER — CLOPIDOGREL BISULFATE 75 MG PO TABS: 75.0000 mg | ORAL_TABLET | Freq: Every day | ORAL | Status: DC

## 2023-04-26 MED ORDER — ONDANSETRON HCL 4 MG/2ML IJ SOLN: 4.0000 mg | Freq: Four times a day (QID) | INTRAMUSCULAR | Status: DC | PRN

## 2023-04-26 MED ORDER — SODIUM CHLORIDE 0.9 % IV SOLN: INTRAVENOUS | Status: DC

## 2023-04-26 MED ORDER — SODIUM CHLORIDE 0.9 % WEIGHT BASED INFUSION: 1.0000 mL/kg/h | INTRAVENOUS | Status: DC

## 2023-04-26 MED ORDER — ACETAMINOPHEN 325 MG PO TABS: 650.0000 mg | ORAL_TABLET | ORAL | Status: DC | PRN

## 2023-04-26 MED ORDER — HEPARIN SODIUM (PORCINE) 1000 UNIT/ML IJ SOLN
INTRAMUSCULAR | Status: AC
  Filled 2023-04-26: qty 10

## 2023-04-26 MED ORDER — HYDRALAZINE HCL 20 MG/ML IJ SOLN: 10.0000 mg | INTRAMUSCULAR | Status: DC | PRN

## 2023-04-26 MED ORDER — MIDAZOLAM HCL 2 MG/2ML IJ SOLN
INTRAMUSCULAR | Status: DC | PRN
  Administered 2023-04-26 (×2): 1 mg via INTRAVENOUS

## 2023-04-26 MED ORDER — ASPIRIN 81 MG PO CHEW
81.0000 mg | CHEWABLE_TABLET | ORAL | Status: AC
  Administered 2023-04-26: 81 mg via ORAL
  Filled 2023-04-26: qty 1

## 2023-04-26 MED ORDER — ASPIRIN 81 MG PO CHEW: 81.0000 mg | CHEWABLE_TABLET | Freq: Every day | ORAL | Status: DC

## 2023-04-26 MED ORDER — FENTANYL CITRATE (PF) 100 MCG/2ML IJ SOLN
INTRAMUSCULAR | Status: DC | PRN
  Administered 2023-04-26: 25 ug via INTRAVENOUS

## 2023-04-26 MED ORDER — SODIUM CHLORIDE 0.9 % IV SOLN: 250.0000 mL | INTRAVENOUS | Status: DC | PRN

## 2023-04-26 MED ORDER — MIDAZOLAM HCL 2 MG/2ML IJ SOLN
INTRAMUSCULAR | Status: AC
  Filled 2023-04-26: qty 2

## 2023-04-26 MED ORDER — LIDOCAINE HCL (PF) 1 % IJ SOLN
INTRAMUSCULAR | Status: DC | PRN
  Administered 2023-04-26: 2 mL
  Administered 2023-04-26: 18 mL

## 2023-04-26 MED ORDER — SODIUM CHLORIDE 0.9% FLUSH: 3.0000 mL | INTRAVENOUS | Status: DC | PRN

## 2023-04-26 MED ORDER — FENTANYL CITRATE (PF) 100 MCG/2ML IJ SOLN
INTRAMUSCULAR | Status: AC
  Filled 2023-04-26: qty 2

## 2023-04-26 MED ORDER — LABETALOL HCL 5 MG/ML IV SOLN: 10.0000 mg | INTRAVENOUS | Status: DC | PRN

## 2023-04-26 MED ORDER — LIDOCAINE HCL (PF) 1 % IJ SOLN
INTRAMUSCULAR | Status: AC
  Filled 2023-04-26: qty 30

## 2023-04-26 MED ORDER — HEPARIN (PORCINE) IN NACL 1000-0.9 UT/500ML-% IV SOLN
INTRAVENOUS | Status: DC | PRN
  Administered 2023-04-26 (×2): 500 mL

## 2023-04-26 MED ORDER — SODIUM CHLORIDE 0.9% FLUSH: 3.0000 mL | Freq: Two times a day (BID) | INTRAVENOUS | Status: DC

## 2023-04-26 MED ORDER — DIAZEPAM 5 MG PO TABS: 5.0000 mg | ORAL_TABLET | Freq: Four times a day (QID) | ORAL | Status: DC | PRN

## 2023-04-26 SURGICAL SUPPLY — 15 items
CATH BALLN WEDGE 5F 110CM (CATHETERS) IMPLANT
CATH INFINITI 5 FR IM (CATHETERS) IMPLANT
CATH INFINITI 5FR MULTPACK ANG (CATHETERS) IMPLANT
CLOSURE MYNX CONTROL 5F (Vascular Products) IMPLANT
ELECT DEFIB PAD ADLT CADENCE (PAD) IMPLANT
GUIDEWIRE .025 260CM (WIRE) IMPLANT
GUIDEWIRE INQWIRE 1.5J.035X260 (WIRE) IMPLANT
INQWIRE 1.5J .035X260CM (WIRE) ×1
PACK CARDIAC CATHETERIZATION (CUSTOM PROCEDURE TRAY) ×1 IMPLANT
SET ATX-X65L (MISCELLANEOUS) IMPLANT
SHEATH GLIDE SLENDER 4/5FR (SHEATH) IMPLANT
SHEATH PINNACLE 5F 10CM (SHEATH) IMPLANT
SHEATH PROBE COVER 6X72 (BAG) IMPLANT
WIRE EMERALD 3MM-J .035X150CM (WIRE) IMPLANT
WIRE EMERALD ST .035X260CM (WIRE) IMPLANT

## 2023-04-26 NOTE — Interval H&P Note (Signed)
History and Physical Interval Note:  04/26/2023 1:04 PM  Craig Harmon  has presented today for surgery, with the diagnosis of dysnea.  The various methods of treatment have been discussed with the patient and family. After consideration of risks, benefits and other options for treatment, the patient has consented to  Procedure(s): RIGHT/LEFT HEART CATH AND CORONARY/GRAFT ANGIOGRAPHY (N/A) as a surgical intervention.  The patient's history has been reviewed, patient examined, no change in status, stable for surgery.  I have reviewed the patient's chart and labs.  Questions were answered to the patient's satisfaction.     [name]

## 2023-04-26 NOTE — Discharge Instructions (Addendum)
Femoral Site Care This sheet gives you information about how to care for yourself after your procedure. Your health care provider may also give you more specific instructions. If you have problems or questions, contact your health care provider. What can I expect after the procedure?  After the procedure, it is common to have: Bruising that usually fades within 1-2 weeks. Tenderness at the site. Follow these instructions at home: Wound care Follow instructions from your health care provider about how to take care of your insertion site. Make sure you: Wash your hands with soap and water before you change your bandage (dressing). If soap and water are not available, use hand sanitizer. Remove your dressing as told by your health care provider. In 24 hours Do not take baths, swim, or use a hot tub until your health care provider approves. You may shower 24-48 hours after the procedure or as told by your health care provider. Gently wash the site with plain soap and water. Pat the area dry with a clean towel. Do not rub the site. This may cause bleeding. Do not apply powder or lotion to the site. Keep the site clean and dry. Check your femoral site every day for signs of infection. Check for: Redness, swelling, or pain. Fluid or blood. Warmth. Pus or a bad smell. Activity For the first 2-3 days after your procedure, or as long as directed: Avoid climbing stairs as much as possible. Do not squat. Do not lift anything that is heavier than 10 lb (4.5 kg), or the limit that you are told, until your health care provider says that it is safe. For 5 days Rest as directed. Avoid sitting for a long time without moving. Get up to take short walks every 1-2 hours. Do not drive for 24 hours if you were given a medicine to help you relax (sedative). General instructions Take over-the-counter and prescription medicines only as told by your health care provider. Keep all follow-up visits as told by  your health care provider. This is important. Contact a health care provider if you have: A fever or chills. You have redness, swelling, or pain around your insertion site. Get help right away if: The catheter insertion area swells very fast. You pass out. You suddenly start to sweat or your skin gets clammy. The catheter insertion area is bleeding, and the bleeding does not stop when you hold steady pressure on the area. The area near or just beyond the catheter insertion site becomes pale, cool, tingly, or numb. These symptoms may represent a serious problem that is an emergency. Do not wait to see if the symptoms will go away. Get medical help right away. Call your local emergency services (911 in the [place] Do not drive yourself to the hospital. Summary After the procedure, it is common to have bruising that usually fades within 1-2 weeks. Check your femoral site every day for signs of infection. Do not lift anything that is heavier than 10 lb (4.5 kg), or the limit that you are told, until your health care provider says that it is safe. This information is not intended to replace advice given to you by your health care provider. Make sure you discuss any questions you have with your health care provider. Document Revised: 09/03/2017 Document Reviewed: 09/03/2017 Elsevier Patient Education  2020 [employer]  Brachial Site Care   This sheet gives you information about how to care for yourself after your procedure. Your health care provider may also give you  more specific instructions. If you have problems or questions, contact your health care provider. What can I expect after the procedure? After the procedure, it is common to have: Bruising and tenderness at the catheter insertion area. Follow these instructions at home:  Insertion site care Follow instructions from your health care provider about how to take care of your insertion site. Make sure you: Wash your hands with soap  and water before you change your bandage (dressing). If soap and water are not available, use hand sanitizer. Remove your dressing as told by your health care provider. In 24 hours Check your insertion site every day for signs of infection. Check for: Redness, swelling, or pain. Pus or a bad smell. Warmth. You may shower 24-48 hours after the procedure. Do not apply powder or lotion to the site.  Activity For 24 hours after the procedure, or as directed by your health care provider: Do not push or pull heavy objects with the affected arm. Do not drive yourself home from the hospital or clinic. You may drive 24 hours after the procedure unless your health care provider tells you not to. Do not lift anything that is heavier than 10 lb (4.5 kg), or the limit that you are told, until your health care provider says that it is safe.  For 24 hours

## 2023-04-26 NOTE — Interval H&P Note (Signed)
Cath Lab Visit (complete for each Cath Lab visit)  Clinical Evaluation Leading to the Procedure:   ACS: No.  Non-ACS:    Anginal Classification: CCS III  Anti-ischemic medical therapy: Minimal Therapy (1 class of medications)  Non-Invasive Test Results: No non-invasive testing performed  Prior CABG: Previous CABG      History and Physical Interval Note:  04/26/2023 1:04 PM  Craig Harmon  has presented today for surgery, with the diagnosis of dysnea.  The various methods of treatment have been discussed with the patient and family. After consideration of risks, benefits and other options for treatment, the patient has consented to  Procedure(s): RIGHT/LEFT HEART CATH AND CORONARY/GRAFT ANGIOGRAPHY (N/A) as a surgical intervention.  The patient's history has been reviewed, patient examined, no change in status, stable for surgery.  I have reviewed the patient's chart and labs.  Questions were answered to the patient's satisfaction.     Craig Harmon

## 2023-04-27 ENCOUNTER — Encounter (HOSPITAL_COMMUNITY): Payer: Self-pay | Admitting: Cardiovascular Disease

## 2023-04-27 MED FILL — Heparin Sodium (Porcine) Inj 1000 Unit/ML: INTRAMUSCULAR | Qty: 10 | Status: AC

## 2023-05-28 ENCOUNTER — Ambulatory Visit: Payer: Medicare HMO | Attending: Nurse Practitioner | Admitting: Nurse Practitioner

## 2023-05-28 ENCOUNTER — Encounter: Payer: Self-pay | Admitting: Nurse Practitioner

## 2023-05-28 VITALS — BP 118/80 | HR 74 | Ht 68.0 in | Wt 187.2 lb

## 2023-05-28 DIAGNOSIS — I350 Nonrheumatic aortic (valve) stenosis: Secondary | ICD-10-CM

## 2023-05-28 DIAGNOSIS — I10 Essential (primary) hypertension: Secondary | ICD-10-CM | POA: Diagnosis not present

## 2023-05-28 DIAGNOSIS — R0609 Other forms of dyspnea: Secondary | ICD-10-CM | POA: Diagnosis not present

## 2023-05-28 DIAGNOSIS — R42 Dizziness and giddiness: Secondary | ICD-10-CM

## 2023-05-28 DIAGNOSIS — I25118 Atherosclerotic heart disease of native coronary artery with other forms of angina pectoris: Secondary | ICD-10-CM | POA: Diagnosis not present

## 2023-05-28 DIAGNOSIS — I25708 Atherosclerosis of coronary artery bypass graft(s), unspecified, with other forms of angina pectoris: Secondary | ICD-10-CM

## 2023-05-28 DIAGNOSIS — I77819 Aortic ectasia, unspecified site: Secondary | ICD-10-CM

## 2023-05-28 DIAGNOSIS — Z79899 Other long term (current) drug therapy: Secondary | ICD-10-CM

## 2023-05-28 DIAGNOSIS — E785 Hyperlipidemia, unspecified: Secondary | ICD-10-CM

## 2023-05-28 DIAGNOSIS — Z720 Tobacco use: Secondary | ICD-10-CM

## 2023-05-28 DIAGNOSIS — E782 Mixed hyperlipidemia: Secondary | ICD-10-CM

## 2023-05-28 MED ORDER — ATORVASTATIN CALCIUM 80 MG PO TABS: 80.0000 mg | ORAL_TABLET | Freq: Every day | ORAL | 3 refills | Status: DC

## 2023-05-28 NOTE — Patient Instructions (Addendum)
Medication Instructions:  Your physician has recommended you make the following change in your medication:  Increase atorvastatin to 80 Mg daily  Continue all other medications as prescribed.  Labwork: CBC, BMET in 1-2 weeks  In 8 weeks FLT, LFT   Testing/Procedures: None  Follow-Up: Your physician recommends that you schedule a follow-up appointment in: 2-3 Months   Any Other Special Instructions Will Be Listed Below (If Applicable).  If you need a refill on your cardiac medications before your next appointment, please call your pharmacy.

## 2023-05-28 NOTE — Progress Notes (Unsigned)
Cardiology Office Note:  .   Date:  05/28/2023 ID:  Craig Harmon, DOB 1951/04/12, MRN [id number] PCP: Kara Pacer, NP  [facility] Providers Cardiologist:  Marjo Bicker, MD    History of Present Illness: .   Craig Harmon is a 72 y.o. male with a PMH of CAD, s/p CABG x 3, HLD, HTN, aortic stenosis, and tobacco abuse, who presents today for scheduled post cardiac cath follow-up.   Last seen by [name] on March 14, 2023.  He noted daily exertional chest pain lasting for about 20 minutes with spontaneous resolution. He stated that duration and intensity of his symptoms were progressively getting worse over time.  Did endorse more tobacco use than normal.  Did note claudication type pain.  Echocardiogram revealed normal LVEF, grade 1 DD, moderate to severe aortic stenosis.  Normal ABIs, arterial duplex arranged for further evaluation.  [name] based on echocardiogram results recommended to reevaluate his symptoms at follow-up and schedule for left heart cath/right heart cath.  Underwent right and left heart cath on 04/26/2023 that revealed severe native CAD with 20% smooth distal left main stenosis, subtotal 99% proximal LAD stenosis, patent stent in left circumflex ostium and mid circumflex extending to the OM 2 vessel, previously documented total proximal RCA occlusion with bridging collateralization, previously documented occluded vein graft supplying distal RCA and circumflex marginal vessel, patent LIMA graft supplying mid LAD, moderate aortic valve stenosis with mean gradient of 20.5 mmHg.  Medical therapy recommended for CAD with close follow-up for aortic valve stenosis with need for possible future AVR.  Aggressive lipid-lowering therapy recommended and to continue maintaining long-term DAPT.  Today he presents for post right/left heart cath follow-up.  Says he seems to be doing a little bit better since last office visit, notices some slight chest  pain at times during exertion, particularly noticed while mowing the yard/carrying groceries, last for about 5 to 6 minutes and has to sit down for this to ease off, describes it as a stinging or burning sensation.  Says this has been stable over time and rates it as a 5 out of 10 on the 0-10 pain scale. Does note some lightheadedness while standing, knows he has to stand up slowly which seems to help, this has been chronic for over 1 year. Denies any worsening shortness of breath, palpitations, syncope, presyncope, dizziness, orthopnea, PND, swelling or significant weight changes, acute bleeding, or claudication.  ROS: Negative. See HPI.  Studies Reviewed: Marland Kitchen    EKG:  EKG Interpretation Date/Time:  Monday May 28 2023 13:57:27 EDT Ventricular Rate:  70 PR Interval:  164 QRS Duration:  104 QT Interval:  412 QTC Calculation: 444 R Axis:   -3  Text Interpretation: Normal sinus rhythm Minimal voltage criteria for LVH, may be normal variant ( Cornell product ) Inferior infarct , age undetermined When compared with ECG of 26-Apr-2023 07:39, No significant change was found Confirmed by Sharlene Dory [phone]) on 05/28/2023 2:50:18 PM   Right/Left heart cath 04/26/2023:    Prox LAD lesion is 99% stenosed.   Ost Cx to Prox Cx lesion is 10% stenosed.   Prox RCA to Mid RCA lesion is 100% stenosed.   RPAV lesion is 95% stenosed.   Dist RCA lesion is 80% stenosed.   Mid LM to Dist LM lesion is 20% stenosed.   Previously placed 2nd Mrg stent of unknown type is  widely patent.   Previously placed Mid Cx stent of unknown type  is  widely patent.   There is moderate aortic valve stenosis.   Recommend uninterrupted dual antiplatelet therapy with Aspirin 81mg  daily and Clopidogrel 75mg  daily.   The patient has been on long-term aspirin 81 mg/clopidogrel 75 mg daily.   Severe native CAD with mild 20% smooth distal left main stenosis; subtotal 99% proximal LAD stenosis; patent stent in the left circumflex  ostium and mid circumflex extending into the OM 2 vessel; and previously documented total proximal RCA occlusion with bridging collateralization.   Previously documented occluded vein graft supplying the distal RCA and circumflex marginal vessel.   Patent LIMA graft supplying the mid LAD.   Moderate aortic valve stenosis with calcified aortic valve with reduced excursion, mean gradient of 20.5 mmHg with valve area 1.3 cm.   RECOMMENDATION: Medical therapy for CAD.  Close follow-up of aortic valve stenosis with need for possible future AVR.  Aggressive lipid-lowering therapy.  The patient has been maintained on long-term DAPT.  Smoking cessation is essential.  ABI's 03/2023:  Summary:  Right: The right toe-brachial index is normal.  Although ankle brachial indices are within normal limits (0.95-1.29),  arterial Doppler waveforms at the ankle suggest some component of arterial  occlusive disease.  Left: The left toe-brachial index is normal.  Although ankle brachial indices are within normal limits (0.95-1.29),  arterial Doppler waveforms at the ankle suggest some component of arterial  occlusive disease.  Echo 03/2023: 1. Left ventricular ejection fraction, by estimation, is 55%. The left  ventricle has normal function. The left ventricle demonstrates regional wall motion abnormalities (see scoring diagram/findings for description). There is moderate concentric left ventricular hypertrophy. Left ventricular diastolic parameters are  consistent with Grade I diastolic dysfunction (impaired relaxation).   2. Right ventricular systolic function is normal. The right ventricular  size is normal. There is normal pulmonary artery systolic pressure.   3. Left atrial size was mildly dilated.   4. The mitral valve is degenerative. Trivial mitral valve regurgitation.  The mean mitral valve gradient is 2.0 mmHg.   5. The aortic valve is tricuspid. There is moderate calcification of the  aortic  valve. Aortic valve regurgitation is not visualized. Moderate to  severe aortic valve stenosis. Aortic valve mean gradient measures 27.0 mmHg. Aortic valve Vmax measures 3.43 m/s. Dimenionless index 0.23.   6. There is moderate dilatation of the ascending aorta, measuring 45 mm.   7. The inferior vena cava is normal in size with greater than 50%  respiratory variability, suggesting right atrial pressure of 3 mmHg.   Comparison(s): Prior images reviewed side by side. LVEF approximately 55% with stable wall motion abnormalities. Moderate to severe aortic stenosis with increased mean gradient to 27 mmHg.  Right/left heart cath 03/2017: 1. Severe three-vessel coronary artery disease with total occlusion of the proximal LAD, total occlusion proximal RCA, and severe stenosis of the left circumflex  2. Status post aortocoronary bypass surgery with continued patency of the LIMA to LAD and total occlusion of saphenous vein graft presumably to the distal RCA and OM branches 3. 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 Moderate aortic stenosis with mean transaortic gradient of 24 mmHg, peak to peak gradient 34 mmHg, and calculated aortic valve area 0.96 cm 5. Successful PCI of the proximal left circumflex and OM branches as detailed   Recommend: Dual antiplatelet therapy with aspirin and clopidogrel for 12 months, aggressive risk reduction and tobacco cessation, continued surveillance of aortic stenosis by serial echo assessment.  If no complications arise should be ready for discharge tomorrow.   Physical Exam:   VS:  BP 118/80   Pulse 74   Ht 5\' 8"  (1.727 m)   Wt 187 lb 3.2 oz (84.9 kg)   SpO2 97%   BMI 28.46 kg/m    Wt Readings from Last 3 Encounters:  05/28/23 187 lb 3.2 oz (84.9 kg)  04/26/23 179 lb (81.2 kg)  04/19/23 183 lb (83 kg)    Orthostatic vital signs:  Lying: 137/88, 69 bpm Sitting: 137/85, 68 bpm Standing: 148/87, 69 bpm Standing 3  minutes: 148/95, 67 bpm   GEN: Well nourished, well developed in no acute distress NECK: No JVD; No carotid bruits CARDIAC: S1/S2, RRR, Grade 3/6 murmur, no rubs, no gallops RESPIRATORY:  Clear to auscultation without rales, wheezing or rhonchi  ABDOMEN: Soft, non-tender, non-distended EXTREMITIES:  No edema; No deformity, right femoral access site from recent cardiac cath shows no bruising or bleeding, small "knot," noted on palpation, almost barely detectable  ASSESSMENT AND PLAN: .    CAD, s/p CABG x 3, DOE Feeling slightly better from last office visit, breathing is stable per his report. Some brief episodes of exertional chest pain, overall says his symptoms are slightly improved from last office visit. Continue Aspirin, Plavix, Losartan, Lopressor, and NTG PRN. Will increase Atorvastatin to 80 mg daily. Hesitant to start Imdur d/t patient's lightheadedness - see #7 below. Previously advised pt to stop taking Ibuprofen and recommended Tylenol PRN for pain. Will obtain CBC and BMET in 1-2 weeks and FLP and LFT in 8 weeks. Heart healthy diet and regular cardiovascular exercise encouraged. Care and ED precautions discussed.  Addendum 05/31/2023: Note routed to [name] for further recs. Recommended anti-anginal therapy for CAD. Due to patient's lightheadedness (see below) and current soft BP, will hold off at this time and re-evaluate at next office visit. ED precautions were discussed. Will consider amlodipine 2.5 mg daily at next office visit.  2. Aortic valve stenosis TTE 03/2023 revealed moderate to severe aortic valve stenosis with mean gradient measuring 27.0 mmHg. See Right/Left heart cath report noted above. Pt denies any worsening/alarm symptoms, but admits to exertional chest pain, may require eventual AVR. Discussed treatment options with patient. Came to shared medical decision with patient that will continue to monitor at this time. Care and ED precautions discussed. Will route  note to [name] for further recs.   Addendum 05/31/2023: Will obtain CT chest/aorta in 1 year and Echo in 1 year for aortic valve stenosis. Will hold off of structural heart referral per Dr. Antoine Poche recommendations.   3. HTN BP stable. No medication changes at this time. Discussed to monitor BP at home at least 2 hours after medications and sitting for 5-10 minutes. Getting labs as mentioned above. Heart healthy diet encouraged.   4. HLD, medication management Last LDL 95 12/2022. Goal LDL < 55. Will increase atorvastatin to 80 mg daily and will repeat FLP and LFT in 8 weeks. Heart healthy diet encouraged.   5. Aortic dilatation Moderate dilatation of ascending aorta noted on TTE, measuring 45 mm 03/2023. Will route note to [name] to see if CT imaging is needed for further review. Denies any red flag signs/symptoms. Care and ED precautions discussed.   Addendum 05/31/2023: Will obtain CT chest/aorta in 1 year and Echo in 1 year for aortic valve stenosis. Will hold off of structural heart referral per Dr. Antoine Poche recommendations.   6. Tobacco abuse  Smoking cessation encouraged  and discussed.   7. Lightheadedness Etiology multifactorial. Orthostatics negative on exam today. Encouraged adequate hydration and conservative measures. Care and ED precautions discussed. He verbalized understanding.   Addendum 05/31/2023: Note routed to [name] for further recs. Recommended anti-anginal therapy for CAD. Due to patient's lightheadedness (see below) and current soft BP, will hold off at this time and re-evaluate at next office visit. ED precautions were discussed.  Dispo: Follow-up with me/APP in 2-3 months or sooner if anything changes.   Signed, Sharlene Dory, NP

## 2023-05-30 NOTE — Telephone Encounter (Signed)
Clearance routed back to preop pool. Patient has since undergone R/LHC and saw Lanora Manis back on 05/28/23 for follow-up. Will route to [facility] for input on whether patient may proceed with EGD given the findings outlined - per her note she was going to discuss next steps with [name] so would also ask if we are able to hold Plavix for procedure if cleared to go ahead. - Please route response to [facility] PREOP (the pre-op pool). Thank you!

## 2023-05-31 LAB — CBC
Hematocrit: 37.9 % (ref 37.5–51.0)
Hemoglobin: 12.6 g/dL — ABNORMAL LOW (ref 13.0–17.7)
MCH: 28.4 pg (ref 26.6–33.0)
MCHC: 33.2 g/dL (ref 31.5–35.7)
MCV: 85 fL (ref 79–97)
Platelets: 287 10*3/uL (ref 150–450)
RBC: 4.44 x10E6/uL (ref 4.14–5.80)
RDW: 14.9 % (ref 11.6–15.4)
WBC: 7 10*3/uL (ref 3.4–10.8)

## 2023-05-31 LAB — BASIC METABOLIC PANEL
BUN/Creatinine Ratio: 18 (ref 10–24)
BUN: 13 mg/dL (ref 8–27)
CO2: 21 mmol/L (ref 20–29)
Calcium: 9.2 mg/dL (ref 8.6–10.2)
Chloride: 102 mmol/L (ref 96–106)
Creatinine, Ser: 0.73 mg/dL — ABNORMAL LOW (ref 0.76–1.27)
Glucose: 93 mg/dL (ref 70–99)
Potassium: 4 mmol/L (ref 3.5–5.2)
Sodium: 139 mmol/L (ref 134–144)
eGFR: 98 mL/min/{1.73_m2} (ref >59)

## 2023-05-31 NOTE — Telephone Encounter (Signed)
Cardiac clearance noted.  Please schedule for EGD+/-ED with Marletta Lor. ASA 3.  Dx: abd pain, dysphagia, abnormal duodenum on CT, prior history of gastric ulcers.  Hold Plavix 5 days.

## 2023-05-31 NOTE — Telephone Encounter (Addendum)
     Primary Cardiologist: Marjo Bicker, MD  Chart reviewed as part of pre-operative protocol coverage. Given past medical history and time since last visit, based on ACC/AHA guidelines, Craig Harmon would be at acceptable risk for the planned procedure without further cardiovascular testing.   Per [name]- "Patient does have exertional chest pain for which he underwent LHC that showed stable CAD and no PCI was required. Lhc confirmed moderate aortic valve stenosis. He does not have to be seen by structural heart.   Chest pains here are multifactorial, large hiatal hernia, gastric ulcers etc., in the setting of stable CAD. Patient is at a low risk for a low risk procedure like EGD. No need of additional testing. Will re-evaluate his symptoms after EGD. "  His Plavix may be held for 5 days prior to his procedure.  His aspirin should be continued throughout his perioperative procedure.  I will route this recommendation to the requesting party via Epic fax function and remove from pre-op pool.  Please call with questions.  Thomasene Ripple. Claxton Levitz NP-C     05/31/2023, 1:01 PM [facility] [address] Office [phone] Fax [phone]

## 2023-06-01 NOTE — Telephone Encounter (Signed)
Will call once we have November schedule

## 2023-06-04 NOTE — Telephone Encounter (Signed)
LMOVM to call back to schedule 

## 2023-06-05 ENCOUNTER — Encounter: Payer: Self-pay | Admitting: *Deleted

## 2023-06-05 NOTE — Telephone Encounter (Signed)
Spoke with pt. Scheduled for 11/4. Aware will send instructions and pre-op appt.  PA approved via cohere. Authorization #[id number], DOS:  07/09/2023 - 09/04/2023

## 2023-07-04 ENCOUNTER — Encounter (HOSPITAL_COMMUNITY): Admission: RE | Admit: 2023-07-04 | Payer: Medicare HMO | Source: Ambulatory Visit

## 2023-07-04 LAB — BASIC METABOLIC PANEL
BUN/Creatinine Ratio: 19 (ref 10–24)
BUN: 15 mg/dL (ref 8–27)
CO2: 22 mmol/L (ref 20–29)
Calcium: 9.2 mg/dL (ref 8.6–10.2)
Chloride: 101 mmol/L (ref 96–106)
Creatinine, Ser: 0.78 mg/dL (ref 0.76–1.27)
Glucose: 89 mg/dL (ref 70–99)
Potassium: 4.2 mmol/L (ref 3.5–5.2)
Sodium: 136 mmol/L (ref 134–144)
eGFR: 96 mL/min/{1.73_m2} (ref >59)

## 2023-07-04 LAB — LIPID PANEL
Chol/HDL Ratio: 2.1 ratio (ref 0.0–5.0)
Cholesterol, Total: 161 mg/dL (ref 100–199)
HDL: 76 mg/dL (ref >39)
LDL Chol Calc (NIH): 75 mg/dL (ref 0–99)
Triglycerides: 48 mg/dL (ref 0–149)
VLDL Cholesterol Cal: 10 mg/dL (ref 5–40)

## 2023-07-04 LAB — HEPATIC FUNCTION PANEL
ALT: 11 [IU]/L (ref 0–44)
AST: 26 [IU]/L (ref 0–40)
Albumin: 4.2 g/dL (ref 3.9–4.9)
Alkaline Phosphatase: 104 [IU]/L (ref 44–121)
Bilirubin Total: 0.5 mg/dL (ref 0.0–1.2)
Bilirubin, Direct: 0.15 mg/dL (ref 0.00–0.40)
Total Protein: 6.6 g/dL (ref 6.0–8.5)

## 2023-07-09 ENCOUNTER — Encounter (HOSPITAL_COMMUNITY): Admission: RE | Payer: Self-pay | Source: Home / Self Care

## 2023-07-09 ENCOUNTER — Ambulatory Visit (HOSPITAL_COMMUNITY): Admission: RE | Admit: 2023-07-09 | Payer: Medicare HMO | Source: Home / Self Care

## 2023-07-09 ENCOUNTER — Encounter: Payer: Self-pay | Admitting: Nurse Practitioner

## 2023-07-09 ENCOUNTER — Encounter (HOSPITAL_COMMUNITY): Payer: Self-pay | Admitting: *Deleted

## 2023-07-09 SURGERY — ESOPHAGOGASTRODUODENOSCOPY (EGD) WITH PROPOFOL
Anesthesia: Monitor Anesthesia Care

## 2023-07-09 NOTE — OR Nursing (Signed)
No show for procedure scheduled to arrive at 11:00. Multiple attempts made at phone numbers listed. Unsuccessful.

## 2023-07-16 ENCOUNTER — Telehealth: Payer: Self-pay | Admitting: Nurse Practitioner

## 2023-07-16 DIAGNOSIS — I25118 Atherosclerotic heart disease of native coronary artery with other forms of angina pectoris: Secondary | ICD-10-CM

## 2023-07-16 DIAGNOSIS — E782 Mixed hyperlipidemia: Secondary | ICD-10-CM

## 2023-07-16 DIAGNOSIS — Z79899 Other long term (current) drug therapy: Secondary | ICD-10-CM

## 2023-07-16 MED ORDER — EZETIMIBE 10 MG PO TABS: 10.0000 mg | ORAL_TABLET | Freq: Every day | ORAL | 1 refills | Status: AC

## 2023-07-16 NOTE — Telephone Encounter (Signed)
Patient called in regarding his Lab results per Lanora Manis she wants to start Zetia 10 mg daily and have labs done in 2 months. Patient verbalized understanding new Rx sent to pharmacy and lab orders mailed to his home.

## 2023-08-13 ENCOUNTER — Encounter (HOSPITAL_COMMUNITY): Payer: Self-pay

## 2023-08-13 ENCOUNTER — Other Ambulatory Visit: Payer: Self-pay

## 2023-08-13 ENCOUNTER — Emergency Department (HOSPITAL_COMMUNITY)
Admission: EM | Admit: 2023-08-13 | Discharge: 2023-08-14 | Discharge status: Against Medical Advice | Payer: Medicare HMO | Attending: Emergency Medicine | Admitting: Emergency Medicine

## 2023-08-13 DIAGNOSIS — Z5321 Procedure and treatment not carried out due to patient leaving prior to being seen by health care provider: Secondary | ICD-10-CM | POA: Diagnosis not present

## 2023-08-13 DIAGNOSIS — H9209 Otalgia, unspecified ear: Secondary | ICD-10-CM | POA: Insufficient documentation

## 2023-08-13 NOTE — ED Triage Notes (Signed)
Pt c/o ear pain x3 days. States that it hard to hear out of, pt already hard of hearing. Denies any drainage.

## 2023-08-23 LAB — LIPID PANEL
Chol/HDL Ratio: 2 {ratio} (ref 0.0–5.0)
Cholesterol, Total: 131 mg/dL (ref 100–199)
HDL: 67 mg/dL (ref >39)
LDL Chol Calc (NIH): 54 mg/dL (ref 0–99)
Triglycerides: 42 mg/dL (ref 0–149)
VLDL Cholesterol Cal: 10 mg/dL (ref 5–40)

## 2023-08-23 LAB — HEPATIC FUNCTION PANEL
ALT: 9 [IU]/L (ref 0–44)
AST: 18 [IU]/L (ref 0–40)
Albumin: 4.4 g/dL (ref 3.9–4.9)
Alkaline Phosphatase: 112 [IU]/L (ref 44–121)
Bilirubin Total: 0.3 mg/dL (ref 0.0–1.2)
Bilirubin, Direct: 0.14 mg/dL (ref 0.00–0.40)
Total Protein: 6.2 g/dL (ref 6.0–8.5)

## 2023-08-28 ENCOUNTER — Encounter: Payer: Self-pay | Admitting: Nurse Practitioner

## 2023-08-28 ENCOUNTER — Ambulatory Visit: Payer: Medicare HMO | Attending: Internal Medicine | Admitting: Nurse Practitioner

## 2023-08-28 ENCOUNTER — Ambulatory Visit: Payer: Medicare HMO | Admitting: Internal Medicine

## 2023-08-28 VITALS — BP 138/80 | HR 78 | Ht 68.0 in | Wt 188.0 lb

## 2023-08-28 DIAGNOSIS — I350 Nonrheumatic aortic (valve) stenosis: Secondary | ICD-10-CM | POA: Diagnosis not present

## 2023-08-28 DIAGNOSIS — E785 Hyperlipidemia, unspecified: Secondary | ICD-10-CM

## 2023-08-28 DIAGNOSIS — Z720 Tobacco use: Secondary | ICD-10-CM

## 2023-08-28 DIAGNOSIS — I2510 Atherosclerotic heart disease of native coronary artery without angina pectoris: Secondary | ICD-10-CM

## 2023-08-28 DIAGNOSIS — I10 Essential (primary) hypertension: Secondary | ICD-10-CM | POA: Diagnosis not present

## 2023-08-28 DIAGNOSIS — I77819 Aortic ectasia, unspecified site: Secondary | ICD-10-CM

## 2023-08-28 NOTE — Progress Notes (Signed)
Cardiology Office Note:  .   Date:  08/28/2023 ID:  Craig Harmon, DOB Feb 09, 1951, MRN [id number] PCP: Kara Pacer, NP  [facility] Providers Cardiologist:  Marjo Bicker, MD    History of Present Illness: .   Craig Harmon is a 72 y.o. male with a PMH of CAD, s/p CABG x 3, HLD, HTN, aortic stenosis, and tobacco abuse, who presents today for scheduled follow-up.   Last seen by [name] on March 14, 2023.  He noted daily exertional chest pain lasting for about 20 minutes with spontaneous resolution. He stated that duration and intensity of his symptoms were progressively getting worse over time.  Did endorse more tobacco use than normal.  Did note claudication type pain.  Echocardiogram revealed normal LVEF, grade 1 DD, moderate to severe aortic stenosis.  Normal ABIs, arterial duplex arranged for further evaluation.  [name] based on echocardiogram results recommended to reevaluate his symptoms at follow-up and schedule for left heart cath/right heart cath.  Underwent right and left heart cath on 04/26/2023 that revealed severe native CAD with 20% smooth distal left main stenosis, subtotal 99% proximal LAD stenosis, patent stent in left circumflex ostium and mid circumflex extending to the OM 2 vessel, previously documented total proximal RCA occlusion with bridging collateralization, previously documented occluded vein graft supplying distal RCA and circumflex marginal vessel, patent LIMA graft supplying mid LAD, moderate aortic valve stenosis with mean gradient of 20.5 mmHg.  Medical therapy recommended for CAD with close follow-up for aortic valve stenosis with need for possible future AVR.  Aggressive lipid-lowering therapy recommended and to continue maintaining long-term DAPT.  05/28/2023 - Today he presents for post right/left heart cath follow-up.  Says he seems to be doing a little bit better since last office visit, notices some slight chest pain  at times during exertion, particularly noticed while mowing the yard/carrying groceries, last for about 5 to 6 minutes and has to sit down for this to ease off, describes it as a stinging or burning sensation.  Says this has been stable over time and rates it as a 5 out of 10 on the 0-10 pain scale. Does note some lightheadedness while standing, knows he has to stand up slowly which seems to help, this has been chronic for over 1 year. Denies any worsening shortness of breath, palpitations, syncope, presyncope, dizziness, orthopnea, PND, swelling or significant weight changes, acute bleeding, or claudication.  08/28/2023 - Today he presents for follow-up. Doing well and denies any acute cardiac complaints or concerns. Denies any chest pain, shortness of breath, palpitations, syncope, presyncope, dizziness, orthopnea, PND, swelling or significant weight changes, acute bleeding, or claudication. Tolerating his medications well.   ROS: Negative. See HPI.  Studies Reviewed: Marland Kitchen    EKG: EKG is not ordered today.      Right/Left heart cath 04/26/2023:    Prox LAD lesion is 99% stenosed.   Ost Cx to Prox Cx lesion is 10% stenosed.   Prox RCA to Mid RCA lesion is 100% stenosed.   RPAV lesion is 95% stenosed.   Dist RCA lesion is 80% stenosed.   Mid LM to Dist LM lesion is 20% stenosed.   Previously placed 2nd Mrg stent of unknown type is  widely patent.   Previously placed Mid Cx stent of unknown type is  widely patent.   There is moderate aortic valve stenosis.   Recommend uninterrupted dual antiplatelet therapy with Aspirin 81mg  daily and Clopidogrel 75mg  daily.  The patient has been on long-term aspirin 81 mg/clopidogrel 75 mg daily.   Severe native CAD with mild 20% smooth distal left main stenosis; subtotal 99% proximal LAD stenosis; patent stent in the left circumflex ostium and mid circumflex extending into the OM 2 vessel; and previously documented total proximal RCA occlusion with bridging  collateralization.   Previously documented occluded vein graft supplying the distal RCA and circumflex marginal vessel.   Patent LIMA graft supplying the mid LAD.   Moderate aortic valve stenosis with calcified aortic valve with reduced excursion, mean gradient of 20.5 mmHg with valve area 1.3 cm.   RECOMMENDATION: Medical therapy for CAD.  Close follow-up of aortic valve stenosis with need for possible future AVR.  Aggressive lipid-lowering therapy.  The patient has been maintained on long-term DAPT.  Smoking cessation is essential.  ABI's 03/2023:  Summary:  Right: The right toe-brachial index is normal.  Although ankle brachial indices are within normal limits (0.95-1.29),  arterial Doppler waveforms at the ankle suggest some component of arterial  occlusive disease.  Left: The left toe-brachial index is normal.  Although ankle brachial indices are within normal limits (0.95-1.29),  arterial Doppler waveforms at the ankle suggest some component of arterial  occlusive disease.  Echo 03/2023: 1. Left ventricular ejection fraction, by estimation, is 55%. The left  ventricle has normal function. The left ventricle demonstrates regional wall motion abnormalities (see scoring diagram/findings for description). There is moderate concentric left ventricular hypertrophy. Left ventricular diastolic parameters are  consistent with Grade I diastolic dysfunction (impaired relaxation).   2. Right ventricular systolic function is normal. The right ventricular  size is normal. There is normal pulmonary artery systolic pressure.   3. Left atrial size was mildly dilated.   4. The mitral valve is degenerative. Trivial mitral valve regurgitation.  The mean mitral valve gradient is 2.0 mmHg.   5. The aortic valve is tricuspid. There is moderate calcification of the  aortic valve. Aortic valve regurgitation is not visualized. Moderate to  severe aortic valve stenosis. Aortic valve mean gradient measures  27.0 mmHg. Aortic valve Vmax measures 3.43 m/s. Dimenionless index 0.23.   6. There is moderate dilatation of the ascending aorta, measuring 45 mm.   7. The inferior vena cava is normal in size with greater than 50%  respiratory variability, suggesting right atrial pressure of 3 mmHg.   Comparison(s): Prior images reviewed side by side. LVEF approximately 55% with stable wall motion abnormalities. Moderate to severe aortic stenosis with increased mean gradient to 27 mmHg.  Right/left heart cath 03/2017: 1. Severe three-vessel coronary artery disease with total occlusion of the proximal LAD, total occlusion proximal RCA, and severe stenosis of the left circumflex  2. Status post aortocoronary bypass surgery with continued patency of the LIMA to LAD and total occlusion of saphenous vein graft presumably to the distal RCA and OM branches 3. 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 Moderate aortic stenosis with mean transaortic gradient of 24 mmHg, peak to peak gradient 34 mmHg, and calculated aortic valve area 0.96 cm 5. Successful PCI of the proximal left circumflex and OM branches as detailed   Recommend: Dual antiplatelet therapy with aspirin and clopidogrel for 12 months, aggressive risk reduction and tobacco cessation, continued surveillance of aortic stenosis by serial echo assessment. If no complications arise should be ready for discharge tomorrow.   Physical Exam:   VS:  BP 138/80   Pulse 78   Ht 5\' 8"  (  1.727 m)   Wt 188 lb (85.3 kg)   SpO2 98%   BMI 28.59 kg/m    Wt Readings from Last 3 Encounters:  08/28/23 188 lb (85.3 kg)  05/28/23 187 lb 3.2 oz (84.9 kg)  04/26/23 179 lb (81.2 kg)    GEN: Well nourished, well developed in no acute distress NECK: No JVD; No carotid bruits CARDIAC: S1/S2, RRR, Grade 3/6 murmur, no rubs, no gallops RESPIRATORY:  Clear to auscultation without rales, wheezing or rhonchi  EXTREMITIES:  No edema; No  deformity  ASSESSMENT AND PLAN: .    CAD, s/p CABG x 3, DOE Stable with no anginal symptoms. No indication for ischemic evaluation. Continue Aspirin, Atorvastatin, Plavix, Losartan, Lopressor, and NTG PRN. Heart healthy diet and regular cardiovascular exercise encouraged. Care and ED precautions discussed.  2. Aortic valve stenosis TTE 03/2023 revealed moderate to severe aortic valve stenosis with mean gradient measuring 27.0 mmHg. See Right/Left heart cath report noted above. Pt denies any symptoms. Will continue to monitor at this time. Care and ED precautions discussed. Plan to obtain CT chest/aorta in 1 year and Echo in 1 year for aortic valve stenosis as previously recommended by [name].   3. HTN BP stable. No medication changes at this time. Discussed to monitor BP at home at least 2 hours after medications and sitting for 5-10 minutes. Heart healthy diet encouraged.   4. HLD Last LDL 54 08/2023, currently at goal. Goal LDL < 55. Continue current medication regimen. Heart healthy diet encouraged.   5. Aortic dilatation Moderate dilatation of ascending aorta noted on TTE, measuring 45 mm 03/2023. Will arrange workup next year as previously advised by Dr. Donney Rankins - see above. Denies any red flag signs/symptoms. Care and ED precautions discussed.   6. Tobacco abuse  Smoking cessation encouraged and discussed.   Dispo: Follow-up with [name] or APP in 6 month or sooner if anything changes.   Signed, Sharlene Dory, NP

## 2023-08-28 NOTE — Patient Instructions (Addendum)

## 2023-11-08 ENCOUNTER — Ambulatory Visit (HOSPITAL_COMMUNITY)
Admission: RE | Admit: 2023-11-08 | Discharge: 2023-11-08 | Disposition: A | Discharge status: Home / Self Care | Source: Ambulatory Visit | Attending: Adult Health | Admitting: Adult Health

## 2023-11-08 ENCOUNTER — Other Ambulatory Visit (HOSPITAL_COMMUNITY): Payer: Self-pay | Admitting: Adult Health

## 2023-11-08 DIAGNOSIS — M5440 Lumbago with sciatica, unspecified side: Secondary | ICD-10-CM | POA: Diagnosis present

## 2023-12-24 ENCOUNTER — Telehealth: Payer: Self-pay | Admitting: Internal Medicine

## 2023-12-24 NOTE — Telephone Encounter (Signed)
 [employer] called left a message that someone called him.... I saw where he last saw Craig Harmon on 09/2023 but I don't see a note indicating anyone from here called him.  Did yo by chance call?

## 2023-12-27 ENCOUNTER — Encounter: Payer: Self-pay | Admitting: *Deleted

## 2023-12-31 ENCOUNTER — Ambulatory Visit (INDEPENDENT_AMBULATORY_CARE_PROVIDER_SITE_OTHER): Admitting: Gastroenterology

## 2023-12-31 ENCOUNTER — Encounter: Payer: Self-pay | Admitting: Gastroenterology

## 2023-12-31 VITALS — BP 108/70 | HR 89 | Temp 98.3°F | Ht 68.0 in | Wt 192.0 lb

## 2023-12-31 DIAGNOSIS — K219 Gastro-esophageal reflux disease without esophagitis: Secondary | ICD-10-CM

## 2023-12-31 DIAGNOSIS — K449 Diaphragmatic hernia without obstruction or gangrene: Secondary | ICD-10-CM | POA: Diagnosis not present

## 2023-12-31 DIAGNOSIS — Z1211 Encounter for screening for malignant neoplasm of colon: Secondary | ICD-10-CM | POA: Insufficient documentation

## 2023-12-31 DIAGNOSIS — K259 Gastric ulcer, unspecified as acute or chronic, without hemorrhage or perforation: Secondary | ICD-10-CM

## 2023-12-31 DIAGNOSIS — Z8711 Personal history of peptic ulcer disease: Secondary | ICD-10-CM

## 2023-12-31 NOTE — Patient Instructions (Signed)
 We will attempt to get records from [facility] (last colonoscopy report). You may not be due for a colonoscopy depending on when your last one was. If we can't get records, we will move forward with colonoscopy.   Recommend limit Goody's powders, ibuprofen to rare use given your history of stomach ulcers.

## 2023-12-31 NOTE — Progress Notes (Signed)
 GI Office Note    Referring Provider: Nsumanganyi, Kalombo Ce* Primary Care Physician:  Nsumanganyi, Kalombo Cesar, NP  Primary Gastroenterologist: Rolando Cliche. Mordechai April, DO   Chief Complaint   Chief Complaint  Patient presents with   Colonoscopy     History of Present Illness   Craig Harmon is a 73 y.o. male presenting today to schedule colonoscopy.  He has a history of GERD, large hiatal hernia with multiple Cameron ulcers, dysphagia with history of Schatzki ring status post dilation, nonbleeding gastric ulcers dysphagia.  Last seen in March 2024.  At that time he was not interested in colonoscopy for colon cancer screening.  Labs April 2025: White blood cell count 8100, hemoglobin 14.5, platelets 270,000, glucose 39, creatinine 0.86, albumin 4.5, total bilirubin 0.2, alk phos 107, AST 16, ALT 7, TSH 1.8, A1c 5.7.  Today:  Heartburn controlled with milk.  No longer on PPI.  No dysphagia.  Continues using ibuprofen and Goody's powders for back/left leg pain.  States he has cut way back from previously when he had an ulcer.  Does not take every day.  No abdominal pain.  Denies any constipation or diarrhea.  No melena or rectal bleeding.  He reports his last colonoscopy was about 6 years ago in [place].  He states it came out fine.  He does not recall when he was supposed to follow-up.  Denies any family history of colon cancer.  We will attempt to obtain copy of last colonoscopy report from [facility] in [place].  Since he was last seen by us  he did undergo a cardiac catheterization in August 2024 that revealed severe native CAD with 20% smooth distal left main stenosis, subtotal 99% proximal LAD stenosis, patent stent in left circumflex ostium and mid circumflex extending to the OM 2 vessel, previously documented total proximal RCA occlusion with bridging collateralization, previously documented occluded vein graft supplying distal RCA and circumflex marginal vessel,  patent LIMA graft supplying mid LAD, moderate aortic valve stenosis with mean gradient of 20.5 mmHg.  Medical therapy recommended for CAD with close follow-up for aortic valve stenosis with need for possible future AVR.  Aggressive lipid-lowering therapy recommended and to continue maintaining long-term DAPT.   CT A/P without contrast 02/17/23: -small non-obstructing renal stones -inflammatory changes surrounding second portion of duodenum without definitive ulcer crater -hiatal hernia   EGD in 01/2022: -Large hiatal hernia with multiple Donelda Fujita ulcers -Mild Schatzki ring status post dilation -Gastritis -Nonbleeding gastric ulcers -Normal duodenal bulb, first portion of duodenum and second portion of duodenum -Mild active chronic gastritis with reactive changes.  No H. pylori.      Medications   Current Outpatient Medications  Medication Sig Dispense Refill   albuterol  (PROVENTIL  HFA;VENTOLIN  HFA) 108 (90 BASE) MCG/ACT inhaler Inhale 2 puffs into the lungs every 6 (six) hours as needed for wheezing or shortness of breath. 1 Inhaler 5   aspirin  81 MG chewable tablet Chew 81 mg by mouth daily.     atorvastatin  (LIPITOR) 40 MG tablet Take 40 mg by mouth daily.     clopidogrel  (PLAVIX ) 75 MG tablet Take 75 mg by mouth daily.     ezetimibe  (ZETIA ) 10 MG tablet Take 1 tablet (10 mg total) by mouth daily. 90 tablet 1   fluticasone (FLONASE) 50 MCG/ACT nasal spray Place 1 spray into both nostrils daily as needed for allergies.     gabapentin  (NEURONTIN ) 300 MG capsule Take 300 mg by mouth daily.  ibuprofen (ADVIL) 200 MG tablet Take 200 mg by mouth every 6 (six) hours as needed.     losartan  (COZAAR ) 25 MG tablet Take 25 mg by mouth daily.     nitroGLYCERIN  (NITROSTAT ) 0.4 MG SL tablet Place 1 tablet (0.4 mg total) under the tongue every 5 (five) minutes x 3 doses as needed for chest pain (if no relief after 3rd dose proceed to ED or call 911). (Patient not taking: Reported on 12/31/2023) 25  tablet 3   No current facility-administered medications for this visit.    Allergies   Allergies as of 12/31/2023   (No Known Allergies)    Past Medical History   Past Medical History:  Diagnosis Date   Arthritis    CAD (coronary artery disease)    a. s/p prior CABG x 3 9LIMA->LAD, VG->RPDA, VG->OM;  b. s/p prior stenting of OM1;  c. 03/2017 Cath/PCI: LAD 100, LCX 90p (3.5x20 Synergy DES), OM1 90 ISR (CBA), RCA 100p, LIMA->LAD ok, VG->OM 100, VG->RPDA 100.   Enlarged thyroid     GERD (gastroesophageal reflux disease)    HLD (hyperlipidemia)    HTN (hypertension)    Moderate aortic stenosis    a. 03/2017 Echo: EF 60-65%, Gr2 DD, mod AS.   Tobacco abuse     Past Surgical History   Past Surgical History:  Procedure Laterality Date   BACK SURGERY     BALLOON DILATION N/A 02/06/2022   Procedure: BALLOON DILATION;  Surgeon: Vinetta Greening, DO;  [facility];  Service: Endoscopy;  Laterality: N/A;   BIOPSY  02/06/2022   Procedure: BIOPSY;  Surgeon: Vinetta Greening, DO;  [facility];  Service: Endoscopy;;   CORONARY ARTERY BYPASS GRAFT  2008   CORONARY BALLOON ANGIOPLASTY N/A 03/26/2017   Procedure: Coronary Balloon Angioplasty;  Surgeon: Arnoldo Lapping, MD;  Location: [facility] INVASIVE CV LAB;  Service: Cardiovascular;  Laterality: N/A;   CORONARY STENT INTERVENTION N/A 03/26/2017   Procedure: Coronary Stent Intervention;  Surgeon: Arnoldo Lapping, MD;  Location: [facility] INVASIVE CV LAB;  Service: Cardiovascular;  Laterality: N/A;   ESOPHAGOGASTRODUODENOSCOPY (EGD) WITH PROPOFOL  N/A 02/06/2022   Procedure: ESOPHAGOGASTRODUODENOSCOPY (EGD) WITH PROPOFOL ;  Surgeon: Vinetta Greening, DO;  [facility];  Service: Endoscopy;  Laterality: N/A;  8:15am   RIGHT/LEFT HEART CATH AND CORONARY/GRAFT ANGIOGRAPHY N/A 03/26/2017   Procedure: Right/Left Heart Cath and Coronary/Graft Angiography;  Surgeon: Arnoldo Lapping, MD;  Location: [facility] INVASIVE CV LAB;  Service:  Cardiovascular;  Laterality: N/A;   RIGHT/LEFT HEART CATH AND CORONARY/GRAFT ANGIOGRAPHY N/A 04/26/2023   Procedure: RIGHT/LEFT HEART CATH AND CORONARY/GRAFT ANGIOGRAPHY;  Surgeon: Millicent Ally, MD;  Location: [facility] INVASIVE CV LAB;  Service: Cardiovascular;  Laterality: N/A;    Past Family History   Family History  Problem Relation Age of Onset   Heart attack Mother    Alcohol abuse Father    CAD Brother    Cancer Brother        does not know what kind   Colon cancer Neg Hx     Past Social History   Social History   Socioeconomic History   Marital status: Divorced    Spouse name: Not on file   Number of children: Not on file   Years of education: Not on file   Highest education level: Not on file  Occupational History   Not on file  Tobacco Use   Smoking status: Every Day    Current packs/day: 0.00    Average  packs/day: 0.5 packs/day for 45.0 years (22.5 ttl pk-yrs)    Types: Cigarettes    Start date: 08/12/1971    Last attempt to quit: 08/11/2016    Years since quitting: 7.3   Smokeless tobacco: Never  Vaping Use   Vaping status: Never Used  Substance and Sexual Activity   Alcohol use: Not Currently    Comment: no etoh in one year, 03/19/23   Drug use: No   Sexual activity: Never  Other Topics Concern   Not on file  Social History Narrative   Not on file   Social Drivers of Health   Financial Resource Strain: Not on file  Food Insecurity: Not on file  Transportation Needs: Not on file  Physical Activity: Not on file  Stress: Not on file  Social Connections: Not on file  Intimate Partner Violence: Not on file    Review of Systems   General: Negative for anorexia, weight loss, fever, chills, fatigue, weakness. Eyes: Negative for vision changes.  ENT: Negative for hoarseness, difficulty swallowing , nasal congestion. CV: Negative for chest pain, angina, palpitations, dyspnea on exertion, peripheral edema.  Respiratory: Negative for dyspnea at rest,  dyspnea on exertion, cough, sputum, wheezing.  GI: See history of present illness. GU:  Negative for dysuria, hematuria, urinary incontinence, urinary frequency, nocturnal urination.  ZO:XWRUEAV back and left leg pain.  Derm: Negative for rash or itching.  Neuro: Negative for weakness, abnormal sensation, seizure, frequent headaches, memory loss,  confusion.  Psych: Negative for anxiety, depression, suicidal ideation, hallucinations.  Endo: Negative for unusual weight change.  Heme: Negative for bruising or bleeding. Allergy: Negative for rash or hives.  Physical Exam   BP 108/70 (BP Location: Right Arm, Patient Position: Sitting, Cuff Size: Normal)   Pulse 89   Temp 98.3 F (36.8 C) (Oral)   Ht 5\' 8"  (1.727 m)   Wt 192 lb (87.1 kg)   SpO2 97%   BMI 29.19 kg/m    General: Well-nourished, well-developed in no acute distress.  Head: Normocephalic, atraumatic.   Eyes: Conjunctiva pink, no icterus. Mouth: Oropharyngeal mucosa moist and pink   Neck: Supple without thyromegaly, masses, or lymphadenopathy.  Lungs: Clear to auscultation bilaterally.  Heart: Regular rate and rhythm, no murmurs rubs or gallops.  Abdomen: Bowel sounds are normal, nontender, nondistended, no hepatosplenomegaly or masses,  no abdominal bruits or hernia, no rebound or guarding.   Rectal: not performed Extremities: trace bilateral lower extremity edema. No clubbing or deformities.  Neuro: Alert and oriented x 4 , grossly normal neurologically.  Skin: Warm and dry, no rash or jaundice.   Psych: Alert and cooperative, normal mood and affect.  Labs   See hpi  Imaging Studies   No results found.  Assessment/Plan:   Colon cancer screening: -patient believes his last colonoscopy was about six years ago. We have requested records. If no records available, he is willing to update his colonoscopy in near future. Will need to hold plavix  for procedure.  GERD/large hiatal hernia/Cameron ulcers/gastric  ulcers: -doing well, some limited reflux with certain foods. -encouraged him to cut back on ASA powder and NSAID use given history of gastric ulcers -currently not on PPI, if resume in the future, avoid omeprazole /esomeprazole  given plavix  use  Trudie Fuse. Harles Lied, MHS, PA-C [facility]

## 2024-01-12 ENCOUNTER — Telehealth: Payer: Self-pay | Admitting: Gastroenterology

## 2024-01-12 NOTE — Telephone Encounter (Signed)
 Please let pt know we did not get any records of a colonoscopy from [facility]. We received copy of egd from 2011.   If he is willing, recommend updating colonoscopy, consider EGD (was cancelled last year) to follow up on gastric ulcers and abnormal duodenum on CT last year.   He would need to hold plavix  five days before procedure. Recommend getting cardiology approval to hold.

## 2024-01-14 NOTE — Telephone Encounter (Signed)
 Pt was made aware and verbalized understanding. Pt is agreeable with updating colonoscopy and EGD and is ready to move forward with scheduling.

## 2024-01-15 ENCOUNTER — Telehealth: Payer: Self-pay | Admitting: *Deleted

## 2024-01-15 NOTE — Telephone Encounter (Signed)
  Request for patient to stop medication prior to procedure or is needing cleareance  01/15/24  [name] 1950/09/16  What type of surgery is being performed? Colonoscopy/EGD   When is surgery scheduled? TBD   What type of clearance is required (medical or pharmacy to hold medication or both? medication  Are there any medications that need to be held prior to surgery and how long? Plavix  x 5 days  Name of physician performing surgery?  Dr.Carver [facility] [employer]: [phone] Fax: [phone]  Anethesia type (none, local, MAC, general)? MAC

## 2024-01-15 NOTE — Telephone Encounter (Signed)
 Clearance sent

## 2024-01-15 NOTE — Telephone Encounter (Signed)
 Ok to move forward with tcs/egd once get cardiac ok to hold plavix  five days.  Dx: colon cancer screening, follow up gastric ulcers, inflammatory changes surrounding second portion of duodenum on CT Hold plavix  five days.

## 2024-01-16 NOTE — Telephone Encounter (Signed)
   Name: Craig Harmon  DOB: [birth year]  MRN: [id number]  Primary Cardiologist: Lasalle Pointer, MD   Preoperative team, please contact this patient and set up a phone call appointment for further preoperative risk assessment. Please obtain consent and complete medication review. Thank you for your help.  I confirm that guidance regarding antiplatelet and oral anticoagulation therapy has been completed and, if necessary, noted below.  Per office protocol, if patient is without any new symptoms or concerns at the time of their virtual visit, he may hold Plavix  for 5 days prior to procedure. Please resume Plavix  as soon as possible postprocedure, at the discretion of the surgeon.   Aspirin  should be continued throughout the perioperative period.  I also confirmed the patient resides in the state of [place] . As per [facility] Medical Board telemedicine laws, the patient must reside in the state in which the provider is licensed.   Jude Norton, NP 01/16/2024, 8:39 AM [facility]

## 2024-01-16 NOTE — Telephone Encounter (Signed)
 Tried to reach pt to schedule tele pre op appt, though vm not set up.

## 2024-01-16 NOTE — Telephone Encounter (Signed)
 2nd attempt: Called patient, NA, no VM box set up to leave a message.

## 2024-01-17 ENCOUNTER — Telehealth: Payer: Self-pay | Admitting: *Deleted

## 2024-01-17 NOTE — Telephone Encounter (Signed)
 Pt called the office today and has scheduled a tele preop appt 01/24/24. Pt states Dr. Mordechai April waiting for clearance before scheduling procedure. Med rec and consent are done.

## 2024-01-17 NOTE — Telephone Encounter (Signed)
 Pt called the office today and has scheduled a tele preop appt 01/24/24. Pt states Dr. Mordechai April waiting for clearance before scheduling procedure. Med rec and consent are done.      Patient Consent for Virtual Visit        Craig Harmon has provided verbal consent on 01/17/2024 for a virtual visit (video or telephone).   CONSENT FOR VIRTUAL VISIT FOR:  Craig Harmon  By participating in this virtual visit I agree to the following:  I hereby voluntarily request, consent and authorize [facility] and its employed or contracted physicians, physician assistants, nurse practitioners or other licensed health care professionals (the Practitioner), to provide me with telemedicine health care services (the "Services") as deemed necessary by the treating Practitioner. I acknowledge and consent to receive the Services by the Practitioner via telemedicine. I understand that the telemedicine visit will involve communicating with the Practitioner through live audiovisual communication technology and the disclosure of certain medical information by electronic transmission. I acknowledge that I have been given the opportunity to request an in-person assessment or other available alternative prior to the telemedicine visit and am voluntarily participating in the telemedicine visit.  I understand that I have the right to withhold or withdraw my consent to the use of telemedicine in the course of my care at any time, without affecting my right to future care or treatment, and that the Practitioner or I may terminate the telemedicine visit at any time. I understand that I have the right to inspect all information obtained and/or recorded in the course of the telemedicine visit and may receive copies of available information for a reasonable fee.  I understand that some of the potential risks of receiving the Services via telemedicine include:  Delay or interruption in medical evaluation due to  technological equipment failure or disruption; Information transmitted may not be sufficient (e.g. poor resolution of images) to allow for appropriate medical decision making by the Practitioner; and/or  In rare instances, security protocols could fail, causing a breach of personal health information.  Furthermore, I acknowledge that it is my responsibility to provide information about my medical history, conditions and care that is complete and accurate to the best of my ability. I acknowledge that Practitioner's advice, recommendations, and/or decision may be based on factors not within their control, such as incomplete or inaccurate data provided by me or distortions of diagnostic images or specimens that may result from electronic transmissions. I understand that the practice of medicine is not an exact science and that Practitioner makes no warranties or guarantees regarding treatment outcomes. I acknowledge that a copy of this consent can be made available to me via my patient portal [facility] MyChart), or I can request a printed copy by calling the office of [facility].    I understand that my insurance will be billed for this visit.   I have read or had this consent read to me. I understand the contents of this consent, which adequately explains the benefits and risks of the Services being provided via telemedicine.  I have been provided ample opportunity to ask questions regarding this consent and the Services and have had my questions answered to my satisfaction. I give my informed consent for the services to be provided through the use of telemedicine in my medical care

## 2024-01-24 ENCOUNTER — Ambulatory Visit: Attending: Cardiology

## 2024-01-24 DIAGNOSIS — Z01810 Encounter for preprocedural cardiovascular examination: Secondary | ICD-10-CM

## 2024-01-24 NOTE — Progress Notes (Signed)
 Virtual Visit via Telephone Note   Because of CIARAN BEGAY co-morbid illnesses, he is at least at moderate risk for complications without adequate follow up.  This format is felt to be most appropriate for this patient at this time.  Due to technical limitations with video connection (technology), today's appointment will be conducted as an audio only telehealth visit, and Craig Harmon verbally agreed to proceed in this manner.   All issues noted in this document were discussed and addressed.  No physical exam could be performed with this format.  Evaluation Performed:  Preoperative cardiovascular risk assessment _____________   Date:  01/24/2024   Patient ID:  Craig Harmon, DOB 1951-04-07, MRN [id number] Patient Location:  Home Provider location:   Office  Primary Care Provider:  Brantley Caldwell, NP Primary Cardiologist:  Vishnu P Mallipeddi, MD  Chief Complaint / Patient Profile   73 y.o. y/o male with a h/o CAD, s/p CABG x 3, HLD, HTN, aortic stenosis, and tobacco abuse  who is pending colonoscopy and endoscopy and presents today for telephonic preoperative cardiovascular risk assessment.  History of Present Illness    Craig Harmon is a 73 y.o. male who presents via audio/video conferencing for a telehealth visit today.  Pt was last seen in cardiology clinic on 08/28/2023 by Lasalle Pointer, NP.  At that time Craig Harmon was doing well with no new cardiac complaints and tolerating medications well.  The patient is now pending procedure as outlined above. Since his last visit, he has been doing well with no new cardiac complaints.  He is limited in his ADLs but is able to complete greater than 4 metabolic equivalents without difficulty.  He denies chest pain, shortness of breath, lower extremity edema, fatigue, palpitations, melena, hematuria, hemoptysis, diaphoresis, weakness, presyncope, syncope, orthopnea, and PND.    Past Medical History    Past  Medical History:  Diagnosis Date   Arthritis    CAD (coronary artery disease)    a. s/p prior CABG x 3 9LIMA->LAD, VG->RPDA, VG->OM;  b. s/p prior stenting of OM1;  c. 03/2017 Cath/PCI: LAD 100, LCX 90p (3.5x20 Synergy DES), OM1 90 ISR (CBA), RCA 100p, LIMA->LAD ok, VG->OM 100, VG->RPDA 100.   Enlarged thyroid     GERD (gastroesophageal reflux disease)    HLD (hyperlipidemia)    HTN (hypertension)    Moderate aortic stenosis    a. 03/2017 Echo: EF 60-65%, Gr2 DD, mod AS.   Tobacco abuse    Past Surgical History:  Procedure Laterality Date   BACK SURGERY     BALLOON DILATION N/A 02/06/2022   Procedure: BALLOON DILATION;  Surgeon: Vinetta Greening, DO;  [facility];  Service: Endoscopy;  Laterality: N/A;   BIOPSY  02/06/2022   Procedure: BIOPSY;  Surgeon: Vinetta Greening, DO;  [facility];  Service: Endoscopy;;   CORONARY ARTERY BYPASS GRAFT  2008   CORONARY BALLOON ANGIOPLASTY N/A 03/26/2017   Procedure: Coronary Balloon Angioplasty;  Surgeon: Arnoldo Lapping, MD;  Location: [facility] INVASIVE CV LAB;  Service: Cardiovascular;  Laterality: N/A;   CORONARY STENT INTERVENTION N/A 03/26/2017   Procedure: Coronary Stent Intervention;  Surgeon: Arnoldo Lapping, MD;  Location: [facility] INVASIVE CV LAB;  Service: Cardiovascular;  Laterality: N/A;   ESOPHAGOGASTRODUODENOSCOPY (EGD) WITH PROPOFOL  N/A 02/06/2022   Procedure: ESOPHAGOGASTRODUODENOSCOPY (EGD) WITH PROPOFOL ;  Surgeon: Vinetta Greening, DO;  [facility];  Service: Endoscopy;  Laterality: N/A;  8:15am   RIGHT/LEFT HEART CATH  AND CORONARY/GRAFT ANGIOGRAPHY N/A 03/26/2017   Procedure: Right/Left Heart Cath and Coronary/Graft Angiography;  Surgeon: Arnoldo Lapping, MD;  Location: [facility] INVASIVE CV LAB;  Service: Cardiovascular;  Laterality: N/A;   RIGHT/LEFT HEART CATH AND CORONARY/GRAFT ANGIOGRAPHY N/A 04/26/2023   Procedure: RIGHT/LEFT HEART CATH AND CORONARY/GRAFT ANGIOGRAPHY;  Surgeon: Millicent Ally, MD;  Location: [facility]  INVASIVE CV LAB;  Service: Cardiovascular;  Laterality: N/A;    Allergies  No Known Allergies  Home Medications    Prior to Admission medications   Medication Sig Start Date End Date Taking? Authorizing Provider  albuterol  (PROVENTIL  HFA;VENTOLIN  HFA) 108 (90 BASE) MCG/ACT inhaler Inhale 2 puffs into the lungs every 6 (six) hours as needed for wheezing or shortness of breath. 10/09/13   Manette Section, MD  aspirin  81 MG chewable tablet Chew 81 mg by mouth daily.    [provider]  atorvastatin  (LIPITOR) 40 MG tablet Take 40 mg by mouth daily.    [provider]  clopidogrel  (PLAVIX ) 75 MG tablet Take 75 mg by mouth daily.    [provider]  ezetimibe  (ZETIA ) 10 MG tablet Take 1 tablet (10 mg total) by mouth daily. 07/16/23 01/17/24  Lasalle Pointer, NP  fluticasone (FLONASE) 50 MCG/ACT nasal spray Place 1 spray into both nostrils daily as needed for allergies. 12/22/20   [provider]  gabapentin  (NEURONTIN ) 300 MG capsule Take 300 mg by mouth daily. 10/19/21   [provider]  ibuprofen (ADVIL) 200 MG tablet Take 200 mg by mouth every 6 (six) hours as needed.    [provider]  losartan  (COZAAR ) 25 MG tablet Take 25 mg by mouth daily.    [provider]  nitroGLYCERIN  (NITROSTAT ) 0.4 MG SL tablet Place 1 tablet (0.4 mg total) under the tongue every 5 (five) minutes x 3 doses as needed for chest pain (if no relief after 3rd dose proceed to ED or call 911). 03/14/23   Mallipeddi, Kennyth Pean, MD    Physical Exam    Vital Signs:  Craig Harmon does not have vital signs available for review today.108/70  Given telephonic nature of communication, physical exam is limited. AAOx3. NAD. Normal affect.  Speech and respirations are unlabored.  Accessory Clinical Findings    None  Assessment & Plan    1.  Preoperative Cardiovascular Risk Assessment: - Patient's RCRI score is 0.9%  The patient affirms he has been doing well  without any new cardiac symptoms. They are able to achieve 5 METS without cardiac limitations. Therefore, based on ACC/AHA guidelines, the patient would be at acceptable risk for the planned procedure without further cardiovascular testing. The patient was advised that if he develops new symptoms prior to surgery to contact our office to arrange for a follow-up visit, and he verbalized understanding.   The patient was advised that if he develops new symptoms prior to surgery to contact our office to arrange for a follow-up visit, and he verbalized understanding.  Patient can hold Plavix  5 days prior to procedure and should restart postprocedure when surgically safe  A copy of this note will be routed to requesting surgeon.  Time:   Today, I have spent 6 minutes with the patient with telehealth technology discussing medical history, symptoms, and management plan.     Francene Ing, Retha Cast, NP  01/24/2024, 7:22 AM

## 2024-02-12 NOTE — Telephone Encounter (Signed)
 Attempted to call pt, unable to leave message due to mailbox not being set up

## 2024-02-12 NOTE — Telephone Encounter (Signed)
 Ok to schedule according to original orders given. Hold plavix  five days.

## 2024-02-14 ENCOUNTER — Encounter: Payer: Self-pay | Admitting: *Deleted

## 2024-02-14 NOTE — Telephone Encounter (Signed)
 Attempted to call pt, unable to leave message due to mailbox not being set up. Letter mailed  TCS/EGD w/Dr.Carver ASA 3 Dx: colon cancer screening, follow up gastric ulcers, inflammatory changes surrounding second portion of duodenum on CT Hold plavix  five days

## 2024-02-26 ENCOUNTER — Ambulatory Visit: Payer: Medicare HMO | Attending: Internal Medicine | Admitting: Internal Medicine

## 2024-02-26 ENCOUNTER — Encounter: Payer: Self-pay | Admitting: Internal Medicine

## 2024-02-26 ENCOUNTER — Telehealth: Payer: Self-pay | Admitting: Internal Medicine

## 2024-02-26 VITALS — BP 114/70 | HR 80 | Ht 68.0 in | Wt 194.2 lb

## 2024-02-26 DIAGNOSIS — I350 Nonrheumatic aortic (valve) stenosis: Secondary | ICD-10-CM | POA: Diagnosis not present

## 2024-02-26 DIAGNOSIS — I2510 Atherosclerotic heart disease of native coronary artery without angina pectoris: Secondary | ICD-10-CM

## 2024-02-26 DIAGNOSIS — R0602 Shortness of breath: Secondary | ICD-10-CM

## 2024-02-26 DIAGNOSIS — I739 Peripheral vascular disease, unspecified: Secondary | ICD-10-CM

## 2024-02-26 DIAGNOSIS — R0609 Other forms of dyspnea: Secondary | ICD-10-CM | POA: Insufficient documentation

## 2024-02-26 DIAGNOSIS — I77819 Aortic ectasia, unspecified site: Secondary | ICD-10-CM | POA: Insufficient documentation

## 2024-02-26 NOTE — Patient Instructions (Addendum)
 Medication Instructions:  Your physician has recommended you make the following change in your medication:  Stop taking Aspirin  and Eliquis Continue taking Plavix  and all other medications prescribed  Labwork: None  Testing/Procedures: Your physician has requested that you have an echocardiogram. Echocardiography is a painless test that uses sound waves to create images of your heart. It provides your doctor with information about the size and shape of your heart and how well your heart's chambers and valves are working. This procedure takes approximately one hour. There are no restrictions for this procedure. Please do NOT wear cologne, perfume, aftershave, or lotions (deodorant is allowed). Please arrive 15 minutes prior to your appointment time.  Please note: We ask at that you not bring children with you during ultrasound (echo/ vascular) testing. Due to room size and safety concerns, children are not allowed in the ultrasound rooms during exams. Our front office staff cannot provide observation of children in our lobby area while testing is being conducted. An adult accompanying a patient to their appointment will only be allowed in the ultrasound room at the discretion of the ultrasound technician under special circumstances. We apologize for any inconvenience.  Your physician has recommended that you have a pulmonary function test. Pulmonary Function Tests are a group of tests that measure how well air moves in and out of your lungs.  Non-Cardiac CT Angiography (CTA), is a special type of CT scan that uses a computer to produce multi-dimensional views of major blood vessels throughout the body. In CT angiography, a contrast material is injected through an IV to help visualize the blood vessels  Your physician has requested that you have a lower or upper extremity arterial duplex. This test is an ultrasound of the arteries in the legs or arms. It looks at arterial blood flow in the legs and  arms. Allow one hour for Lower and Upper Arterial scans. There are no restrictions or special instructions.  Please note: We ask at that you not bring children with you during ultrasound (echo/ vascular) testing. Due to room size and safety concerns, children are not allowed in the ultrasound rooms during exams. Our front office staff cannot provide observation of children in our lobby area while testing is being conducted. An adult accompanying a patient to their appointment will only be allowed in the ultrasound room at the discretion of the ultrasound technician under special circumstances. We apologize for any inconvenience.   Follow-Up: Your physician recommends that you schedule a follow-up appointment in: 6 months  Any Other Special Instructions Will Be Listed Below (If Applicable).  Your Doctor has recommended you purchase a blood pressure cuff   If you need a refill on your cardiac medications before your next appointment, please call your pharmacy.

## 2024-02-26 NOTE — Progress Notes (Signed)
 Cardiology Office Note  Date: 02/26/2024   ID: Nameer, Summer [birth year], MRN [id number]  PCP:  Benjamin Raina Elizabeth, NP  Cardiologist:  Diannah SHAUNNA Maywood, MD Electrophysiologist:  None   Reason for Office Visit: CAD evaluation   History of Present Illness: Craig Harmon is a 73 y.o. male known to have CAD s/p OM1 PCI, s/p 3v CABG (LIMA to LAD, SVG to RPDA, SVG to OM), s/p ostial LCx PCI in 2018, HTN, HLD, moderate aortic valve stenosis, nicotine  abuse is here for follow-up visit.  Initially referred to cardiology clinic for evaluation of exertional chest pains.  Echocardiogram obtained in July 2024 showed normal LVEF, G1 DD, moderate to severe atrial stenosis (DVI 0.23 compared to 0.31 before).  Due to frequent exertional chest pains, he was scheduled to undergo LHC and RHC that showed severe native CAD, patent LIMA to LAD, previously documented occluded vein graft supplying distal RCA and circumflex marginal vessel as well as moderate aortic valve stenosis with calcified aortic valve with reduced excursion, mean gradient of 20.5 mmHg and valve area 1.3 cm.  Imaging also showed a large hiatal hernia and Cameron ulcers.  Currently following with GI, on PPIs.  He is here for follow-up visit.  He had LLE swelling recently, his PCP prescribed him Eliquis 10 mg twice daily to be taken for 15 days.  He underwent LLE ultrasound venous Dopplers that ruled out DVT.  But swelling resolved after few days.  Reports having once in a while chest pains.  Especially when he is outside in the heat and resolves when he gets into the house.  Has chronic stable DOE.  Uses inhalers.  No orthopnea, leg swelling.  No dizziness, syncope, lightheadedness or palpitations although he did report palpitations couple of times in the last 6 months to 1 year.  Past Medical History:  Diagnosis Date   Arthritis    CAD (coronary artery disease)    a. s/p prior CABG x 3 9LIMA->LAD, VG->RPDA, VG->OM;   b. s/p prior stenting of OM1;  c. 03/2017 Cath/PCI: LAD 100, LCX 90p (3.5x20 Synergy DES), OM1 90 ISR (CBA), RCA 100p, LIMA->LAD ok, VG->OM 100, VG->RPDA 100.   Enlarged thyroid     GERD (gastroesophageal reflux disease)    HLD (hyperlipidemia)    HTN (hypertension)    Moderate aortic stenosis    a. 03/2017 Echo: EF 60-65%, Gr2 DD, mod AS.   Tobacco abuse     Past Surgical History:  Procedure Laterality Date   BACK SURGERY     BALLOON DILATION N/A 02/06/2022   Procedure: BALLOON DILATION;  Surgeon: Cindie Carlin POUR, DO;  Location: [facility];  Service: Endoscopy;  Laterality: N/A;   BIOPSY  02/06/2022   Procedure: BIOPSY;  Surgeon: Cindie Carlin POUR, DO;  Location: [facility];  Service: Endoscopy;;   CORONARY ARTERY BYPASS GRAFT  2008   CORONARY BALLOON ANGIOPLASTY N/A 03/26/2017   Procedure: Coronary Balloon Angioplasty;  Surgeon: Wonda Sharper, MD;  Location: [facility] INVASIVE CV LAB;  Service: Cardiovascular;  Laterality: N/A;   CORONARY STENT INTERVENTION N/A 03/26/2017   Procedure: Coronary Stent Intervention;  Surgeon: Wonda Sharper, MD;  Location: [facility] INVASIVE CV LAB;  Service: Cardiovascular;  Laterality: N/A;   ESOPHAGOGASTRODUODENOSCOPY (EGD) WITH PROPOFOL  N/A 02/06/2022   Procedure: ESOPHAGOGASTRODUODENOSCOPY (EGD) WITH PROPOFOL ;  Surgeon: Cindie Carlin POUR, DO;  Location: [facility];  Service: Endoscopy;  Laterality: N/A;  8:15am   RIGHT/LEFT HEART CATH AND CORONARY/GRAFT ANGIOGRAPHY N/A 03/26/2017  Procedure: Right/Left Heart Cath and Coronary/Graft Angiography;  Surgeon: Wonda Sharper, MD;  Location: [facility] INVASIVE CV LAB;  Service: Cardiovascular;  Laterality: N/A;   RIGHT/LEFT HEART CATH AND CORONARY/GRAFT ANGIOGRAPHY N/A 04/26/2023   Procedure: RIGHT/LEFT HEART CATH AND CORONARY/GRAFT ANGIOGRAPHY;  Surgeon: Burnard Debby LABOR, MD;  Location: [facility] INVASIVE CV LAB;  Service: Cardiovascular;  Laterality: N/A;    Current Outpatient Medications  Medication Sig Dispense Refill    albuterol  (PROVENTIL  HFA;VENTOLIN  HFA) 108 (90 BASE) MCG/ACT inhaler Inhale 2 puffs into the lungs every 6 (six) hours as needed for wheezing or shortness of breath. 1 Inhaler 5   aspirin  81 MG chewable tablet Chew 81 mg by mouth daily.     atorvastatin  (LIPITOR) 40 MG tablet Take 40 mg by mouth daily.     clopidogrel  (PLAVIX ) 75 MG tablet Take 75 mg by mouth daily.     ezetimibe  (ZETIA ) 10 MG tablet Take 1 tablet (10 mg total) by mouth daily. 90 tablet 1   fluticasone (FLONASE) 50 MCG/ACT nasal spray Place 1 spray into both nostrils daily as needed for allergies.     gabapentin  (NEURONTIN ) 300 MG capsule Take 300 mg by mouth daily.     ibuprofen (ADVIL) 200 MG tablet Take 200 mg by mouth every 6 (six) hours as needed.     losartan  (COZAAR ) 25 MG tablet Take 25 mg by mouth daily.     nitroGLYCERIN  (NITROSTAT ) 0.4 MG SL tablet Place 1 tablet (0.4 mg total) under the tongue every 5 (five) minutes x 3 doses as needed for chest pain (if no relief after 3rd dose proceed to ED or call 911). 25 tablet 3   No current facility-administered medications for this visit.   Allergies:  Patient has no known allergies.   Social History: The patient  reports that he has been smoking cigarettes. He started smoking about 52 years ago. He has a 22.5 pack-year smoking history. He has never used smokeless tobacco. He reports that he does not currently use alcohol. He reports that he does not use drugs.   Family History: The patient's family history includes Alcohol abuse in his father; CAD in his brother; Cancer in his brother; Heart attack in his mother.   ROS:  Please see the history of present illness. Otherwise, complete review of systems is positive for none  All other systems are reviewed and negative.   Physical Exam: VS:  Ht 5' 8 (1.727 m)   Wt 194 lb 3.2 oz (88.1 kg)   BMI 29.53 kg/m , BMI Body mass index is 29.53 kg/m.  Wt Readings from Last 3 Encounters:  02/26/24 194 lb 3.2 oz (88.1 kg)   12/31/23 192 lb (87.1 kg)  08/28/23 188 lb (85.3 kg)    General: Patient appears comfortable at rest. HEENT: Conjunctiva and lids normal, oropharynx clear with moist mucosa. Neck: Supple, no elevated JVP or carotid bruits, no thyromegaly. Lungs: Clear to auscultation, nonlabored breathing at rest. Cardiac: Regular rate and rhythm, ESM with S2 present Abdomen: Soft, nontender, no hepatomegaly, bowel sounds present, no guarding or rebound. Extremities: No pitting edema, distal pulses 2+. Skin: Warm and dry. Musculoskeletal: No kyphosis. Neuropsychiatric: Alert and oriented x3, affect grossly appropriate.  Recent Labwork: 05/30/2023: Hemoglobin 12.6; Platelets 287 07/03/2023: BUN 15; Creatinine, Ser 0.78; Potassium 4.2; Sodium 136 08/22/2023: ALT 9; AST 18     Component Value Date/Time   CHOL 131 08/22/2023 1049   TRIG 42 08/22/2023 1049   HDL 67 08/22/2023 1049   CHOLHDL  2.0 08/22/2023 1049   CHOLHDL 2.5 03/25/2017 0519   VLDL 12 03/25/2017 0519   LDLCALC 54 08/22/2023 1049    Assessment and Plan:  CAD s/p OM1 PCI, s/p three-vessel CABG (LIMA to LAD, SVG to RPDA, SVG to OM), s/p ostial LCx PCI and angioplasty of OM1 in 2018 with normal LVEF: No interval angina except for chest pains once in a while. This could be CAD versus large hiatal hernia with Ole ulcers on PPI.  Did not have to take SL NTG.  He underwent LHC/RHC in August 2024 that showed severe native vessel CAD, patent LIMA to LAD and known occluded SVG to RPDA and OM.  Moderate aortic valve stenosis was noted on cath.  Currently taking aspirin  81 mg once daily, Plavix  75 mg once daily and Eliquis 10 mg twice daily.  Stop aspirin  and Eliquis.  Continue Plavix  75 mg once daily.  Continue atorvastatin  40 mg nightly and Zetia  10 mg once daily.  SL NTG 0.4 mg as needed for chest pain.  ER precautions for chest pain provided.  Moderate aortic valve stenosis in 2024: Aortic valve stenosis is moderate on echo in 2023 and cath in  2024.  Physical exam today is consistent with moderate aortic valve stenosis.  Obtain echocardiogram.  Aorta dilatation 45 mm on echo: Obtain CTA chest/aorta.  Blood pressure is well-controlled.  LLE pain, rule out PAD: ABI values normal but based on arterial Doppler waveforms of the ankle, there is some element of PAD.  Will obtain ultrasound arterial Doppler lower extremities.  DOE: Chronic stable DOE.  Uses inhalers.  Obtain PFTs.  No orthopnea or PND or leg swelling, unlikely cardiac.  HLD, at goal: LDL 54 2024.  Continue atorvastatin  40 mg nightly and Zetia  10 mg once daily.  Goal LDL less than 55.  Nicotine  abuse: Currently smokes 10 cigarettes/day.  Counseling provided.  Medication Adjustments/Labs and Tests Ordered: Current medicines are reviewed at length with the patient today.  Concerns regarding medicines are outlined above.   Tests Ordered: Orders Placed This Encounter  Procedures   EKG 12-Lead    Medication Changes: No orders of the defined types were placed in this encounter.   Disposition:  Follow up 6 months  Signed Hurshell Dino Priya Leanah Kolander, MD, 02/26/2024 9:26 AM    [facility] at [facility] [address] [place], [place], [place] [address]

## 2024-02-26 NOTE — Telephone Encounter (Signed)
 Checking percert on the following patient for testing scheduled at [facility].    CT ANGIO CHEST AORTA W/CM & OR   03/13/2024

## 2024-03-13 ENCOUNTER — Ambulatory Visit (HOSPITAL_COMMUNITY)
Admission: RE | Admit: 2024-03-13 | Discharge: 2024-03-13 | Disposition: A | Discharge status: Home / Self Care | Source: Ambulatory Visit | Attending: Internal Medicine | Admitting: Internal Medicine

## 2024-03-13 ENCOUNTER — Ambulatory Visit (HOSPITAL_COMMUNITY)

## 2024-03-13 DIAGNOSIS — R0602 Shortness of breath: Secondary | ICD-10-CM | POA: Insufficient documentation

## 2024-03-13 LAB — PULMONARY FUNCTION TEST
DL/VA % pred: 96 %
DL/VA: 3.89 ml/min/mmHg/L
DLCO cor % pred: 86 %
DLCO cor: 20.78 ml/min/mmHg
DLCO unc % pred: 79 %
DLCO unc: 19.15 ml/min/mmHg
FEF 25-75 Post: 3.23 L/s
FEF 25-75 Pre: 1.9 L/s
FEF2575-%Change-Post: 69 %
FEF2575-%Pred-Post: 144 %
FEF2575-%Pred-Pre: 84 %
FEV1-%Change-Post: 12 %
FEV1-%Pred-Post: 98 %
FEV1-%Pred-Pre: 87 %
FEV1-Post: 2.93 L
FEV1-Pre: 2.61 L
FEV1FVC-%Change-Post: 0 %
FEV1FVC-%Pred-Pre: 99 %
FEV6-%Change-Post: 12 %
FEV6-%Pred-Post: 101 %
FEV6-%Pred-Pre: 90 %
FEV6-Post: 3.88 L
FEV6-Pre: 3.46 L
FEV6FVC-%Change-Post: 0 %
FEV6FVC-%Pred-Post: 102 %
FEV6FVC-%Pred-Pre: 103 %
FVC-%Change-Post: 12 %
FVC-%Pred-Post: 98 %
FVC-%Pred-Pre: 87 %
FVC-Post: 4.01 L
FVC-Pre: 3.56 L
Post FEV1/FVC ratio: 73 %
Post FEV6/FVC ratio: 97 %
Pre FEV1/FVC ratio: 73 %
Pre FEV6/FVC Ratio: 97 %
RV % pred: 143 %
RV: 3.4 L
TLC % pred: 103 %
TLC: 6.88 L

## 2024-03-13 MED ORDER — ALBUTEROL SULFATE (2.5 MG/3ML) 0.083% IN NEBU
2.5000 mg | INHALATION_SOLUTION | Freq: Once | RESPIRATORY_TRACT | Status: AC
  Administered 2024-03-13: 2.5 mg via RESPIRATORY_TRACT

## 2024-03-19 ENCOUNTER — Other Ambulatory Visit

## 2024-03-19 ENCOUNTER — Ambulatory Visit: Payer: Self-pay | Admitting: Internal Medicine

## 2024-03-19 ENCOUNTER — Encounter

## 2024-03-21 NOTE — Telephone Encounter (Signed)
 The patient has been notified of the result and verbalized understanding.  All questions (if any) were answered. Craig Harmon, CMA 03/21/2024 3:31 PM

## 2024-03-21 NOTE — Telephone Encounter (Signed)
-----   Message from Vishnu P Mallipeddi sent at 03/19/2024  8:18 AM EDT ----- Minimal obstructive airway disease, asthmatic type. ----- Message ----- From: Interface, Lab In Three Zero One Sent: 03/13/2024  10:15 AM EDT To: Vishnu P Mallipeddi, MD

## 2024-03-25 ENCOUNTER — Other Ambulatory Visit: Payer: Self-pay | Admitting: Internal Medicine

## 2024-03-25 DIAGNOSIS — I739 Peripheral vascular disease, unspecified: Secondary | ICD-10-CM

## 2024-03-31 ENCOUNTER — Other Ambulatory Visit

## 2024-03-31 ENCOUNTER — Encounter

## 2024-04-16 ENCOUNTER — Ambulatory Visit (HOSPITAL_COMMUNITY)
Admission: RE | Admit: 2024-04-16 | Discharge: 2024-04-16 | Disposition: A | Discharge status: Home / Self Care | Source: Ambulatory Visit | Attending: Internal Medicine | Admitting: Internal Medicine

## 2024-04-16 DIAGNOSIS — I77819 Aortic ectasia, unspecified site: Secondary | ICD-10-CM | POA: Insufficient documentation

## 2024-04-16 MED ORDER — IOHEXOL 300 MG/ML  SOLN: 100.0000 mL | Freq: Once | INTRAMUSCULAR | Status: DC | PRN

## 2024-04-16 MED ORDER — IOHEXOL 350 MG/ML SOLN
75.0000 mL | Freq: Once | INTRAVENOUS | Status: AC | PRN
  Administered 2024-04-16 (×2): 75 mL via INTRAVENOUS

## 2024-04-17 ENCOUNTER — Ambulatory Visit (INDEPENDENT_AMBULATORY_CARE_PROVIDER_SITE_OTHER)

## 2024-04-17 ENCOUNTER — Ambulatory Visit: Attending: Internal Medicine

## 2024-04-17 DIAGNOSIS — I350 Nonrheumatic aortic (valve) stenosis: Secondary | ICD-10-CM | POA: Diagnosis not present

## 2024-04-17 DIAGNOSIS — I739 Peripheral vascular disease, unspecified: Secondary | ICD-10-CM

## 2024-04-17 LAB — ECHOCARDIOGRAM COMPLETE
AR max vel: 0.75 cm2
AV Area VTI: 0.76 cm2
AV Area mean vel: 0.71 cm2
AV Mean grad: 33.5 mmHg
AV Peak grad: 56.5 mmHg
AV Vena cont: 0.5 cm
Ao pk vel: 3.76 m/s
Area-P 1/2: 3.01 cm2
Calc EF: 60.4 %
MV VTI: 2.27 cm2
P 1/2 time: 624 ms
S' Lateral: 2.9 cm
Single Plane A2C EF: 61.2 %
Single Plane A4C EF: 61.1 %

## 2024-04-17 LAB — VAS US ABI WITH/WO TBI
Left ABI: 1
Right ABI: 0.99

## 2024-04-22 ENCOUNTER — Ambulatory Visit: Payer: Self-pay | Admitting: Internal Medicine

## 2024-07-01 ENCOUNTER — Other Ambulatory Visit (HOSPITAL_COMMUNITY): Payer: Self-pay | Admitting: Internal Medicine

## 2024-07-01 DIAGNOSIS — Z87891 Personal history of nicotine dependence: Secondary | ICD-10-CM

## 2024-08-06 ENCOUNTER — Encounter (HOSPITAL_COMMUNITY): Payer: Self-pay

## 2024-08-06 ENCOUNTER — Ambulatory Visit (HOSPITAL_COMMUNITY): Admission: RE | Admit: 2024-08-06 | Source: Ambulatory Visit

## 2024-08-18 ENCOUNTER — Ambulatory Visit: Admitting: Internal Medicine

## 2024-08-22 ENCOUNTER — Encounter: Payer: Self-pay | Admitting: Nurse Practitioner

## 2024-08-22 ENCOUNTER — Ambulatory Visit: Admitting: Internal Medicine

## 2024-08-22 ENCOUNTER — Ambulatory Visit: Attending: Nurse Practitioner | Admitting: Nurse Practitioner

## 2024-08-22 VITALS — BP 138/88 | HR 85 | Ht 68.0 in | Wt 196.8 lb

## 2024-08-22 DIAGNOSIS — R0609 Other forms of dyspnea: Secondary | ICD-10-CM

## 2024-08-22 DIAGNOSIS — Z79899 Other long term (current) drug therapy: Secondary | ICD-10-CM | POA: Diagnosis not present

## 2024-08-22 DIAGNOSIS — E785 Hyperlipidemia, unspecified: Secondary | ICD-10-CM

## 2024-08-22 DIAGNOSIS — I2510 Atherosclerotic heart disease of native coronary artery without angina pectoris: Secondary | ICD-10-CM

## 2024-08-22 DIAGNOSIS — Z720 Tobacco use: Secondary | ICD-10-CM | POA: Diagnosis not present

## 2024-08-22 DIAGNOSIS — I712 Thoracic aortic aneurysm, without rupture, unspecified: Secondary | ICD-10-CM

## 2024-08-22 DIAGNOSIS — I350 Nonrheumatic aortic (valve) stenosis: Secondary | ICD-10-CM

## 2024-08-22 DIAGNOSIS — I10 Essential (primary) hypertension: Secondary | ICD-10-CM | POA: Diagnosis not present

## 2024-08-22 DIAGNOSIS — E782 Mixed hyperlipidemia: Secondary | ICD-10-CM

## 2024-08-22 MED ORDER — ATORVASTATIN CALCIUM 80 MG PO TABS: 80.0000 mg | ORAL_TABLET | Freq: Every day | ORAL | 6 refills | Status: AC

## 2024-08-22 NOTE — Progress Notes (Unsigned)
 " Cardiology Office Note:  .   Date:  08/28/2023 ID:  Craig Harmon, DOB [birth year], MRN [id number] PCP: Benjamin Raina Kinza Gouveia, NP  [facility] Providers Cardiologist:  Diannah SHAUNNA Maywood, MD    History of Present Illness: .   Craig Harmon is a 73 y.o. male with a PMH of CAD, s/p CABG x 3, HLD, HTN, aortic stenosis, and tobacco abuse, who presents today for scheduled follow-up.   Last seen by Dr. Maywood on March 14, 2023.  He noted daily exertional chest pain lasting for about 20 minutes with spontaneous resolution. He stated that duration and intensity of his symptoms were progressively getting worse over time.  Did endorse more tobacco use than normal.  Did note claudication type pain.  Echocardiogram revealed normal LVEF, grade 1 DD, moderate to severe aortic stenosis.  Normal ABIs, arterial duplex arranged for further evaluation.  Dr. Mallipeddi based on echocardiogram results recommended to reevaluate his symptoms at follow-up and schedule for left heart cath/right heart cath.  Underwent right and left heart cath on 04/26/2023 that revealed severe native CAD with 20% smooth distal left main stenosis, subtotal 99% proximal LAD stenosis, patent stent in left circumflex ostium and mid circumflex extending to the OM 2 vessel, previously documented total proximal RCA occlusion with bridging collateralization, previously documented occluded vein graft supplying distal RCA and circumflex marginal vessel, patent LIMA graft supplying mid LAD, moderate aortic valve stenosis with mean gradient of 20.5 mmHg.  Medical therapy recommended for CAD with close follow-up for aortic valve stenosis with need for possible future AVR.  Aggressive lipid-lowering therapy recommended and to continue maintaining long-term DAPT.  05/28/2023 - Today he presents for post right/left heart cath follow-up.  Says he seems to be doing a little bit better since last office visit, notices some slight chest pain  at times during exertion, particularly noticed while mowing the yard/carrying groceries, last for about 5 to 6 minutes and has to sit down for this to ease off, describes it as a stinging or burning sensation.  Says this has been stable over time and rates it as a 5 out of 10 on the 0-10 pain scale. Does note some lightheadedness while standing, knows he has to stand up slowly which seems to help, this has been chronic for over 1 year. Denies any worsening shortness of breath, palpitations, syncope, presyncope, dizziness, orthopnea, PND, swelling or significant weight changes, acute bleeding, or claudication.  08/28/2023 - Today he presents for follow-up. Doing well and denies any acute cardiac complaints or concerns. Denies any chest pain, shortness of breath, palpitations, syncope, presyncope, dizziness, orthopnea, PND, swelling or significant weight changes, acute bleeding, or claudication. Tolerating his medications well.   ROS: Negative. See HPI.  Studies Reviewed: SABRA    EKG: EKG is not ordered today.      Right/Left heart cath 04/26/2023:    Prox LAD lesion is 99% stenosed.   Ost Cx to Prox Cx lesion is 10% stenosed.   Prox RCA to Mid RCA lesion is 100% stenosed.   RPAV lesion is 95% stenosed.   Dist RCA lesion is 80% stenosed.   Mid LM to Dist LM lesion is 20% stenosed.   Previously placed 2nd Mrg stent of unknown type is  widely patent.   Previously placed Mid Cx stent of unknown type is  widely patent.   There is moderate aortic valve stenosis.   Recommend uninterrupted dual antiplatelet therapy with Aspirin  81mg  daily and Clopidogrel  75mg  daily.  The patient has been on long-term aspirin  81 mg/clopidogrel  75 mg daily.   Severe native CAD with mild 20% smooth distal left main stenosis; subtotal 99% proximal LAD stenosis; patent stent in the left circumflex ostium and mid circumflex extending into the OM 2 vessel; and previously documented total proximal RCA occlusion with bridging  collateralization.   Previously documented occluded vein graft supplying the distal RCA and circumflex marginal vessel.   Patent LIMA graft supplying the mid LAD.   Moderate aortic valve stenosis with calcified aortic valve with reduced excursion, mean gradient of 20.5 mmHg with valve area 1.3 cm.   RECOMMENDATION: Medical therapy for CAD.  Close follow-up of aortic valve stenosis with need for possible future AVR.  Aggressive lipid-lowering therapy.  The patient has been maintained on long-term DAPT.  Smoking cessation is essential.  ABI's 03/2023:  Summary:  Right: The right toe-brachial index is normal.  Although ankle brachial indices are within normal limits (0.95-1.29),  arterial Doppler waveforms at the ankle suggest some component of arterial  occlusive disease.  Left: The left toe-brachial index is normal.  Although ankle brachial indices are within normal limits (0.95-1.29),  arterial Doppler waveforms at the ankle suggest some component of arterial  occlusive disease.  Echo 03/2023: 1. Left ventricular ejection fraction, by estimation, is 55%. The left  ventricle has normal function. The left ventricle demonstrates regional wall motion abnormalities (see scoring diagram/findings for description). There is moderate concentric left ventricular hypertrophy. Left ventricular diastolic parameters are  consistent with Grade I diastolic dysfunction (impaired relaxation).   2. Right ventricular systolic function is normal. The right ventricular  size is normal. There is normal pulmonary artery systolic pressure.   3. Left atrial size was mildly dilated.   4. The mitral valve is degenerative. Trivial mitral valve regurgitation.  The mean mitral valve gradient is 2.0 mmHg.   5. The aortic valve is tricuspid. There is moderate calcification of the  aortic valve. Aortic valve regurgitation is not visualized. Moderate to  severe aortic valve stenosis. Aortic valve mean gradient measures  27.0 mmHg. Aortic valve Vmax measures 3.43 m/s. Dimenionless index 0.23.   6. There is moderate dilatation of the ascending aorta, measuring 45 mm.   7. The inferior vena cava is normal in size with greater than 50%  respiratory variability, suggesting right atrial pressure of 3 mmHg.   Comparison(s): Prior images reviewed side by side. LVEF approximately 55% with stable wall motion abnormalities. Moderate to severe aortic stenosis with increased mean gradient to 27 mmHg.  Right/left heart cath 03/2017: 1. Severe three-vessel coronary artery disease with total occlusion of the proximal LAD, total occlusion proximal RCA, and severe stenosis of the left circumflex  2. Status post aortocoronary bypass surgery with continued patency of the LIMA to LAD and total occlusion of saphenous vein graft presumably to the distal RCA and OM branches 3. 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 Moderate aortic stenosis with mean transaortic gradient of 24 mmHg, peak to peak gradient 34 mmHg, and calculated aortic valve area 0.96 cm 5. Successful PCI of the proximal left circumflex and OM branches as detailed   Recommend: Dual antiplatelet therapy with aspirin  and clopidogrel  for 12 months, aggressive risk reduction and tobacco cessation, continued surveillance of aortic stenosis by serial echo assessment. If no complications arise should be ready for discharge tomorrow.   Physical Exam:   VS:  BP 138/88   Pulse 85   Ht 5' 8 (  1.727 m)   Wt 196 lb 12.8 oz (89.3 kg)   SpO2 96%   BMI 29.92 kg/m    Wt Readings from Last 3 Encounters:  08/22/24 196 lb 12.8 oz (89.3 kg)  02/26/24 194 lb 3.2 oz (88.1 kg)  12/31/23 192 lb (87.1 kg)    GEN: Well nourished, well developed in no acute distress NECK: No JVD; No carotid bruits CARDIAC: S1/S2, RRR, Grade 3/6 murmur, no rubs, no gallops RESPIRATORY:  Clear to auscultation without rales, wheezing or rhonchi  EXTREMITIES:  No  edema; No deformity  ASSESSMENT AND PLAN: .     No chest pain or shortness of breath.  February 2026 repeating CT scan,   CAD, s/p CABG x 3, DOE Stable with no anginal symptoms. No indication for ischemic evaluation. Continue Aspirin , Atorvastatin , Plavix , Losartan , Lopressor , and NTG PRN. Heart healthy diet and regular cardiovascular exercise encouraged. Care and ED precautions discussed.  2. Aortic valve stenosis TTE 03/2023 revealed moderate to severe aortic valve stenosis with mean gradient measuring 27.0 mmHg. See Right/Left heart cath report noted above. Pt denies any symptoms. Will continue to monitor at this time. Care and ED precautions discussed. Plan to obtain CT chest/aorta in 1 year and Echo in 1 year for aortic valve stenosis as previously recommended by Dr. Mallipeddi.   3. HTN BP stable. No medication changes at this time. Discussed to monitor BP at home at least 2 hours after medications and sitting for 5-10 minutes. Heart healthy diet encouraged.   4. HLD Last LDL 54 08/2023, currently at goal. Goal LDL < 55. Continue current medication regimen. Heart healthy diet encouraged.   5. Aortic dilatation Moderate dilatation of ascending aorta noted on TTE, measuring 45 mm 03/2023. Will arrange workup next year as previously advised by Dr. Malliepeddi - see above. Denies any red flag signs/symptoms. Care and ED precautions discussed.   6. Tobacco abuse  Smoking cessation encouraged and discussed.   3-4 months  Smoking - weaned down some.     CT scan aorta in February 2026.  Lipitor 80 mg daily and repeat FLP/LFT - labs in 2-3 months. [facility] [place].        Dispo: Follow-up with Dr. Mallipeddi or APP in 6 month or sooner if anything changes.   Signed, Almarie Crate, NP   "

## 2024-08-22 NOTE — Progress Notes (Unsigned)
 " Cardiology Office Note:  .   Date:  08/28/2023 ID:  Craig Harmon, DOB [birth year], MRN [id number] PCP: Craig Raina Elverna Caffee, NP  [facility] Providers Cardiologist:  Craig SHAUNNA Maywood, MD    History of Present Illness: .   Craig Harmon is a 73 y.o. male with a PMH of CAD, s/p CABG x 3, HLD, HTN, aortic stenosis, and tobacco abuse, who presents today for scheduled follow-up.   Last seen by Dr. Maywood on March 14, 2023.  He noted daily exertional chest pain lasting for about 20 minutes with spontaneous resolution. He stated that duration and intensity of his symptoms were progressively getting worse over time.  Did endorse more tobacco use than normal.  Did note claudication type pain.  Echocardiogram revealed normal LVEF, grade 1 DD, moderate to severe aortic stenosis.  Normal ABIs, arterial duplex arranged for further evaluation.  Dr. Mallipeddi based on echocardiogram results recommended to reevaluate his symptoms at follow-up and schedule for left heart cath/right heart cath.  Underwent right and left heart cath on 04/26/2023 that revealed severe native CAD with 20% smooth distal left main stenosis, subtotal 99% proximal LAD stenosis, patent stent in left circumflex ostium and mid circumflex extending to the OM 2 vessel, previously documented total proximal RCA occlusion with bridging collateralization, previously documented occluded vein graft supplying distal RCA and circumflex marginal vessel, patent LIMA graft supplying mid LAD, moderate aortic valve stenosis with mean gradient of 20.5 mmHg.  Medical therapy recommended for CAD with close follow-up for aortic valve stenosis with need for possible future AVR.  Aggressive lipid-lowering therapy recommended and to continue maintaining long-term DAPT.  05/28/2023 - Today he presents for post right/left heart cath follow-up.  Says he seems to be doing a little bit better since last office visit, notices some slight chest pain  at times during exertion, particularly noticed while mowing the yard/carrying groceries, last for about 5 to 6 minutes and has to sit down for this to ease off, describes it as a stinging or burning sensation.  Says this has been stable over time and rates it as a 5 out of 10 on the 0-10 pain scale. Does note some lightheadedness while standing, knows he has to stand up slowly which seems to help, this has been chronic for over 1 year. Denies any worsening shortness of breath, palpitations, syncope, presyncope, dizziness, orthopnea, PND, swelling or significant weight changes, acute bleeding, or claudication.  08/28/2023 - Today he presents for follow-up. Doing well and denies any acute cardiac complaints or concerns. Denies any chest pain, shortness of breath, palpitations, syncope, presyncope, dizziness, orthopnea, PND, swelling or significant weight changes, acute bleeding, or claudication. Tolerating his medications well.   ROS: Negative. See HPI.  Studies Reviewed: SABRA    EKG: EKG is not ordered today.      Right/Left heart cath 04/26/2023:    Prox LAD lesion is 99% stenosed.   Ost Cx to Prox Cx lesion is 10% stenosed.   Prox RCA to Mid RCA lesion is 100% stenosed.   RPAV lesion is 95% stenosed.   Dist RCA lesion is 80% stenosed.   Mid LM to Dist LM lesion is 20% stenosed.   Previously placed 2nd Mrg stent of unknown type is  widely patent.   Previously placed Mid Cx stent of unknown type is  widely patent.   There is moderate aortic valve stenosis.   Recommend uninterrupted dual antiplatelet therapy with Aspirin  81mg  daily and Clopidogrel  75mg  daily.  The patient has been on long-term aspirin  81 mg/clopidogrel  75 mg daily.   Severe native CAD with mild 20% smooth distal left main stenosis; subtotal 99% proximal LAD stenosis; patent stent in the left circumflex ostium and mid circumflex extending into the OM 2 vessel; and previously documented total proximal RCA occlusion with bridging  collateralization.   Previously documented occluded vein graft supplying the distal RCA and circumflex marginal vessel.   Patent LIMA graft supplying the mid LAD.   Moderate aortic valve stenosis with calcified aortic valve with reduced excursion, mean gradient of 20.5 mmHg with valve area 1.3 cm.   RECOMMENDATION: Medical therapy for CAD.  Close follow-up of aortic valve stenosis with need for possible future AVR.  Aggressive lipid-lowering therapy.  The patient has been maintained on long-term DAPT.  Smoking cessation is essential.  ABI's 03/2023:  Summary:  Right: The right toe-brachial index is normal.  Although ankle brachial indices are within normal limits (0.95-1.29),  arterial Doppler waveforms at the ankle suggest some component of arterial  occlusive disease.  Left: The left toe-brachial index is normal.  Although ankle brachial indices are within normal limits (0.95-1.29),  arterial Doppler waveforms at the ankle suggest some component of arterial  occlusive disease.  Echo 03/2023: 1. Left ventricular ejection fraction, by estimation, is 55%. The left  ventricle has normal function. The left ventricle demonstrates regional wall motion abnormalities (see scoring diagram/findings for description). There is moderate concentric left ventricular hypertrophy. Left ventricular diastolic parameters are  consistent with Grade I diastolic dysfunction (impaired relaxation).   2. Right ventricular systolic function is normal. The right ventricular  size is normal. There is normal pulmonary artery systolic pressure.   3. Left atrial size was mildly dilated.   4. The mitral valve is degenerative. Trivial mitral valve regurgitation.  The mean mitral valve gradient is 2.0 mmHg.   5. The aortic valve is tricuspid. There is moderate calcification of the  aortic valve. Aortic valve regurgitation is not visualized. Moderate to  severe aortic valve stenosis. Aortic valve mean gradient measures  27.0 mmHg. Aortic valve Vmax measures 3.43 m/s. Dimenionless index 0.23.   6. There is moderate dilatation of the ascending aorta, measuring 45 mm.   7. The inferior vena cava is normal in size with greater than 50%  respiratory variability, suggesting right atrial pressure of 3 mmHg.   Comparison(s): Prior images reviewed side by side. LVEF approximately 55% with stable wall motion abnormalities. Moderate to severe aortic stenosis with increased mean gradient to 27 mmHg.  Right/left heart cath 03/2017: 1. Severe three-vessel coronary artery disease with total occlusion of the proximal LAD, total occlusion proximal RCA, and severe stenosis of the left circumflex  2. Status post aortocoronary bypass surgery with continued patency of the LIMA to LAD and total occlusion of saphenous vein graft presumably to the distal RCA and OM branches 3. 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 Moderate aortic stenosis with mean transaortic gradient of 24 mmHg, peak to peak gradient 34 mmHg, and calculated aortic valve area 0.96 cm 5. Successful PCI of the proximal left circumflex and OM branches as detailed   Recommend: Dual antiplatelet therapy with aspirin  and clopidogrel  for 12 months, aggressive risk reduction and tobacco cessation, continued surveillance of aortic stenosis by serial echo assessment. If no complications arise should be ready for discharge tomorrow.   Physical Exam:   VS:  BP 138/88   Pulse 85   Ht 5' 8 (  1.727 m)   Wt 196 lb 12.8 oz (89.3 kg)   SpO2 96%   BMI 29.92 kg/m    Wt Readings from Last 3 Encounters:  08/22/24 196 lb 12.8 oz (89.3 kg)  02/26/24 194 lb 3.2 oz (88.1 kg)  12/31/23 192 lb (87.1 kg)    GEN: Well nourished, well developed in no acute distress NECK: No JVD; No carotid bruits CARDIAC: S1/S2, RRR, Grade 3/6 murmur, no rubs, no gallops RESPIRATORY:  Clear to auscultation without rales, wheezing or rhonchi  EXTREMITIES:  No  edema; No deformity  ASSESSMENT AND PLAN: .    CAD, s/p CABG x 3, DOE Stable with no anginal symptoms. No indication for ischemic evaluation. Continue Aspirin , Atorvastatin , Plavix , Losartan , Lopressor , and NTG PRN. Heart healthy diet and regular cardiovascular exercise encouraged. Care and ED precautions discussed.  2. Aortic valve stenosis TTE 03/2023 revealed moderate to severe aortic valve stenosis with mean gradient measuring 27.0 mmHg. See Right/Left heart cath report noted above. Pt denies any symptoms. Will continue to monitor at this time. Care and ED precautions discussed. Plan to obtain CT chest/aorta in 1 year and Echo in 1 year for aortic valve stenosis as previously recommended by Dr. Mallipeddi.   3. HTN BP stable. No medication changes at this time. Discussed to monitor BP at home at least 2 hours after medications and sitting for 5-10 minutes. Heart healthy diet encouraged.   4. HLD Last LDL 54 08/2023, currently at goal. Goal LDL < 55. Continue current medication regimen. Heart healthy diet encouraged.   5. Aortic dilatation Moderate dilatation of ascending aorta noted on TTE, measuring 45 mm 03/2023. Will arrange workup next year as previously advised by Dr. Malliepeddi - see above. Denies any red flag signs/symptoms. Care and ED precautions discussed.   6. Tobacco abuse  Smoking cessation encouraged and discussed.   Dispo: Follow-up with Dr. Mallipeddi or APP in 6 month or sooner if anything changes.   Signed, Almarie Crate, NP   "

## 2024-08-22 NOTE — Patient Instructions (Addendum)
 Medication Instructions:   Increase Atorvastatin  to 80mg  daily  Continue all other medications.     Labwork:  FLP, LFT - orders given  Please do in 2-3 months Reminder:  Nothing to eat or drink after 12 midnight prior to labs.  Testing/Procedures:  CTA chest / aorta  DUE FEBRUARY 2026  Follow-Up:  3-4 months   Any Other Special Instructions Will Be Listed Below (If Applicable).  Smoking cessation info given today   If you need a refill on your cardiac medications before your next appointment, please call your pharmacy.

## 2024-10-13 ENCOUNTER — Ambulatory Visit (HOSPITAL_COMMUNITY)

## 2024-11-03 ENCOUNTER — Ambulatory Visit: Admitting: Nurse Practitioner
